# Patient Record
Sex: Male | Born: 1960
Health system: Southern US, Community
[De-identification: ages and names within clinical notes are randomized; demographics above are authoritative.]

## PROBLEM LIST (undated history)

## (undated) DIAGNOSIS — K219 Gastro-esophageal reflux disease without esophagitis: Secondary | ICD-10-CM

## (undated) DIAGNOSIS — K5792 Diverticulitis of intestine, part unspecified, without perforation or abscess without bleeding: Secondary | ICD-10-CM

## (undated) DIAGNOSIS — M2022 Hallux rigidus, left foot: Secondary | ICD-10-CM

## (undated) DIAGNOSIS — F319 Bipolar disorder, unspecified: Secondary | ICD-10-CM

## (undated) DIAGNOSIS — K5790 Diverticulosis of intestine, part unspecified, without perforation or abscess without bleeding: Secondary | ICD-10-CM

## (undated) DIAGNOSIS — K760 Fatty (change of) liver, not elsewhere classified: Secondary | ICD-10-CM

## (undated) DIAGNOSIS — K648 Other hemorrhoids: Secondary | ICD-10-CM

## (undated) DIAGNOSIS — B019 Varicella without complication: Secondary | ICD-10-CM

## (undated) DIAGNOSIS — K635 Polyp of colon: Secondary | ICD-10-CM

## (undated) HISTORY — PX: HERNIA REPAIR: SHX51

## (undated) HISTORY — DX: Diverticulitis of intestine, part unspecified, without perforation or abscess without bleeding: K57.92

## (undated) HISTORY — DX: Fatty (change of) liver, not elsewhere classified: K76.0

## (undated) HISTORY — DX: Gastro-esophageal reflux disease without esophagitis: K21.9

## (undated) HISTORY — PX: APPENDECTOMY: SHX54

## (undated) HISTORY — DX: Other hemorrhoids: K64.8

## (undated) HISTORY — DX: Polyp of colon: K63.5

## (undated) HISTORY — DX: Varicella without complication: B01.9

## (undated) HISTORY — DX: Diverticulosis of intestine, part unspecified, without perforation or abscess without bleeding: K57.90

## (undated) HISTORY — DX: Bipolar disorder, unspecified: F31.9

---

## 2013-11-13 DIAGNOSIS — D518 Other vitamin B12 deficiency anemias: Secondary | ICD-10-CM | POA: Diagnosis not present

## 2013-11-13 DIAGNOSIS — N400 Enlarged prostate without lower urinary tract symptoms: Secondary | ICD-10-CM | POA: Diagnosis not present

## 2013-11-13 DIAGNOSIS — N419 Inflammatory disease of prostate, unspecified: Secondary | ICD-10-CM | POA: Diagnosis not present

## 2013-11-13 DIAGNOSIS — E291 Testicular hypofunction: Secondary | ICD-10-CM | POA: Diagnosis not present

## 2013-11-13 DIAGNOSIS — F319 Bipolar disorder, unspecified: Secondary | ICD-10-CM | POA: Diagnosis not present

## 2013-11-13 DIAGNOSIS — E119 Type 2 diabetes mellitus without complications: Secondary | ICD-10-CM | POA: Diagnosis not present

## 2013-11-13 DIAGNOSIS — F411 Generalized anxiety disorder: Secondary | ICD-10-CM | POA: Diagnosis not present

## 2013-11-13 DIAGNOSIS — R300 Dysuria: Secondary | ICD-10-CM | POA: Diagnosis not present

## 2013-11-13 DIAGNOSIS — D508 Other iron deficiency anemias: Secondary | ICD-10-CM | POA: Diagnosis not present

## 2013-11-27 DIAGNOSIS — J209 Acute bronchitis, unspecified: Secondary | ICD-10-CM | POA: Diagnosis not present

## 2013-11-27 DIAGNOSIS — Z6827 Body mass index (BMI) 27.0-27.9, adult: Secondary | ICD-10-CM | POA: Diagnosis not present

## 2013-11-27 DIAGNOSIS — E119 Type 2 diabetes mellitus without complications: Secondary | ICD-10-CM | POA: Diagnosis not present

## 2014-12-07 DIAGNOSIS — K5750 Diverticulosis of both small and large intestine without perforation or abscess without bleeding: Secondary | ICD-10-CM | POA: Diagnosis not present

## 2014-12-07 DIAGNOSIS — K5732 Diverticulitis of large intestine without perforation or abscess without bleeding: Secondary | ICD-10-CM | POA: Diagnosis not present

## 2015-03-01 DIAGNOSIS — B852 Pediculosis, unspecified: Secondary | ICD-10-CM | POA: Diagnosis not present

## 2015-03-01 DIAGNOSIS — B850 Pediculosis due to Pediculus humanus capitis: Secondary | ICD-10-CM | POA: Diagnosis not present

## 2015-04-15 DIAGNOSIS — F312 Bipolar disorder, current episode manic severe with psychotic features: Secondary | ICD-10-CM | POA: Diagnosis not present

## 2015-04-26 ENCOUNTER — Emergency Department (INDEPENDENT_AMBULATORY_CARE_PROVIDER_SITE_OTHER)
Admission: EM | Admit: 2015-04-26 | Discharge: 2015-04-26 | Disposition: A | Discharge status: Home / Self Care | Payer: Medicare Other | Source: Home / Self Care | Attending: Emergency Medicine | Admitting: Emergency Medicine

## 2015-04-26 ENCOUNTER — Encounter (HOSPITAL_COMMUNITY): Payer: Self-pay | Admitting: Emergency Medicine

## 2015-04-26 ENCOUNTER — Emergency Department (HOSPITAL_COMMUNITY): Payer: Medicare Other

## 2015-04-26 DIAGNOSIS — S39012A Strain of muscle, fascia and tendon of lower back, initial encounter: Secondary | ICD-10-CM | POA: Diagnosis not present

## 2015-04-26 DIAGNOSIS — M25531 Pain in right wrist: Secondary | ICD-10-CM | POA: Diagnosis not present

## 2015-04-26 MED ORDER — METHOCARBAMOL 500 MG PO TABS: 500.0000 mg | ORAL_TABLET | Freq: Four times a day (QID) | ORAL | Status: DC | PRN

## 2015-04-26 MED ORDER — MELOXICAM 15 MG PO TABS: 7.5000 mg | ORAL_TABLET | Freq: Every day | ORAL | Status: DC

## 2015-04-26 MED ORDER — KETOROLAC TROMETHAMINE 60 MG/2ML IM SOLN
60.0000 mg | Freq: Once | INTRAMUSCULAR | Status: AC
  Administered 2015-04-26: 60 mg via INTRAMUSCULAR

## 2015-04-26 MED ORDER — KETOROLAC TROMETHAMINE 60 MG/2ML IM SOLN
INTRAMUSCULAR | Status: AC
  Filled 2015-04-26: qty 2

## 2015-04-26 NOTE — ED Notes (Signed)
C/o back pain and wrist pain States he fall in March 05 2014 States right side of back hurts radiating downward States left wrist hurts due to fall

## 2015-04-26 NOTE — ED Provider Notes (Signed)
CSN: [id number]     Arrival date & time 04/26/15  1302 History   None    Chief Complaint  Patient presents with  . Back Pain   (Consider location/radiation/quality/duration/timing/severity/associated sxs/prior Treatment) HPI   Fell on tile floor on 03/06/15. Initially w/ pain on R flank. Slowly has migrated up to R upper back. Hot and cold, and tylenol w/o improvememntn. Not getting better.   L wrist pain. Also started shortly after the fall. Pain comes and goes. No swelling. No loss of function or numbness or tingling in the hand. Wrist splint with intermittent improvement.  Patient purchased a new mattress to see if this would help but has had no additional relief. Symptoms overall are not getting worse but are not getting better either.  Pain is back is worse with certain movements.  History reviewed. No pertinent past medical history. History reviewed. No pertinent past surgical history. Family History  Problem Relation Age of Onset  . Cancer Mother   . Heart attack Father    Social History  Substance Use Topics  . Smoking status: Never Smoker   . Smokeless tobacco: None  . Alcohol Use: No    Review of Systems Per HPI with all other pertinent systems negative.   Allergies  Review of patient's allergies indicates no known allergies.  Home Medications   Prior to Admission medications   Medication Sig Start Date End Date Taking? Authorizing Provider  meloxicam (MOBIC) 15 MG tablet Take 0.5-1 tablets (7.5-15 mg total) by mouth daily. 04/26/15   Waldemar Dickens, MD  methocarbamol (ROBAXIN) 500 MG tablet Take 1-2 tablets (500-1,000 mg total) by mouth every 6 (six) hours as needed for muscle spasms. 04/26/15   Waldemar Dickens, MD   BP 129/90 mmHg  Pulse 101  Temp(Src) 98.3 F (36.8 C) (Oral)  Resp 16  SpO2 95% Physical Exam Physical Exam  Constitutional: oriented to person, place, and time. appears well-developed and well-nourished. No distress.  HENT:  Head:  Normocephalic and atraumatic.  Eyes: EOMI. PERRL.  Neck: Normal range of motion.  Cardiovascular: RRR, no m/r/g, 2+ distal pulses,  Pulmonary/Chest: Effort normal and breath sounds normal. No respiratory distress.  Abdominal: Soft. Bowel sounds are normal. NonTTP, no distension.  Musculoskeletal: Normal range of motion. Non ttp, no effusion.  Neurological: alert and oriented to person, place, and time.  Skin: Skin is warm. No rash noted. non diaphoretic.  Psychiatric: normal mood and affect. behavior is normal. Judgment and thought content normal.   ED Course  Procedures (including critical care time) Labs Review Labs Reviewed - No data to display  Imaging Review No results found.   MDM   1. Back strain, initial encounter   2. Wrist joint pain, right    Total 60 mg IM, given in clinic. Start meloxicam and Robaxin and exercises demonstrated. Heat and ice and massage as tolerated. No need for imaging of the left wrist given patient's full function and the fact that the wrist is nontender to palpation. Patient follow-up with PCP or sports medicine if not improving.    Waldemar Dickens, MD 04/26/15 [phone]

## 2015-04-26 NOTE — Discharge Instructions (Signed)
Your injuries are likely not permanent. You likely suffered muscle strain and spasm. Your given a dose of Toradol to help with your pain and inflammation. In 24 hours she may start the meloxicam. Please start using the Robaxin to help relieve your tight muscles. Please remember to perform range of motion exercises and to use heat and massage as tolerated.

## 2015-05-21 DIAGNOSIS — F312 Bipolar disorder, current episode manic severe with psychotic features: Secondary | ICD-10-CM | POA: Diagnosis not present

## 2015-07-26 ENCOUNTER — Emergency Department (HOSPITAL_COMMUNITY)
Admission: EM | Admit: 2015-07-26 | Discharge: 2015-07-26 | Disposition: A | Discharge status: Home / Self Care | Payer: Commercial Managed Care - HMO | Attending: Emergency Medicine | Admitting: Emergency Medicine

## 2015-07-26 ENCOUNTER — Emergency Department (HOSPITAL_COMMUNITY): Payer: Commercial Managed Care - HMO

## 2015-07-26 ENCOUNTER — Encounter (HOSPITAL_COMMUNITY): Payer: Self-pay | Admitting: Emergency Medicine

## 2015-07-26 DIAGNOSIS — Z8659 Personal history of other mental and behavioral disorders: Secondary | ICD-10-CM | POA: Insufficient documentation

## 2015-07-26 DIAGNOSIS — F329 Major depressive disorder, single episode, unspecified: Secondary | ICD-10-CM | POA: Insufficient documentation

## 2015-07-26 DIAGNOSIS — M79675 Pain in left toe(s): Secondary | ICD-10-CM | POA: Insufficient documentation

## 2015-07-26 DIAGNOSIS — Z791 Long term (current) use of non-steroidal anti-inflammatories (NSAID): Secondary | ICD-10-CM | POA: Diagnosis not present

## 2015-07-26 DIAGNOSIS — R0789 Other chest pain: Secondary | ICD-10-CM | POA: Diagnosis not present

## 2015-07-26 DIAGNOSIS — F17210 Nicotine dependence, cigarettes, uncomplicated: Secondary | ICD-10-CM | POA: Diagnosis not present

## 2015-07-26 DIAGNOSIS — R079 Chest pain, unspecified: Secondary | ICD-10-CM | POA: Diagnosis not present

## 2015-07-26 DIAGNOSIS — F32A Depression, unspecified: Secondary | ICD-10-CM

## 2015-07-26 LAB — BASIC METABOLIC PANEL
ANION GAP: 9 (ref 5–15)
BUN: 10 mg/dL (ref 6–20)
CALCIUM: 9.7 mg/dL (ref 8.9–10.3)
CO2: 25 mmol/L (ref 22–32)
Chloride: 105 mmol/L (ref 101–111)
Creatinine, Ser: 0.95 mg/dL (ref 0.61–1.24)
GLUCOSE: 111 mg/dL — AB (ref 65–99)
POTASSIUM: 3.9 mmol/L (ref 3.5–5.1)
SODIUM: 139 mmol/L (ref 135–145)

## 2015-07-26 LAB — CBC WITH DIFFERENTIAL/PLATELET
Basophils Absolute: 0 10*3/uL (ref 0.0–0.1)
Basophils Relative: 0 %
EOS PCT: 2 %
Eosinophils Absolute: 0.2 10*3/uL (ref 0.0–0.7)
HCT: 43.4 % (ref 39.0–52.0)
HEMOGLOBIN: 15.3 g/dL (ref 13.0–17.0)
LYMPHS ABS: 2.5 10*3/uL (ref 0.7–4.0)
LYMPHS PCT: 31 %
MCH: 32.3 pg (ref 26.0–34.0)
MCHC: 35.3 g/dL (ref 30.0–36.0)
MCV: 91.6 fL (ref 78.0–100.0)
MONOS PCT: 5 %
Monocytes Absolute: 0.4 10*3/uL (ref 0.1–1.0)
Neutro Abs: 4.9 10*3/uL (ref 1.7–7.7)
Neutrophils Relative %: 62 %
PLATELETS: 227 10*3/uL (ref 150–400)
RBC: 4.74 MIL/uL (ref 4.22–5.81)
RDW: 12.7 % (ref 11.5–15.5)
WBC: 8 10*3/uL (ref 4.0–10.5)

## 2015-07-26 LAB — I-STAT TROPONIN, ED: TROPONIN I, POC: 0 ng/mL (ref 0.00–0.08)

## 2015-07-26 MED ORDER — DICLOFENAC SODIUM 25 MG PO TBEC
25.0000 mg | DELAYED_RELEASE_TABLET | Freq: Once | ORAL | Status: DC
  Filled 2015-07-26: qty 1

## 2015-07-26 MED ORDER — DIAZEPAM 2 MG PO TABS
2.0000 mg | ORAL_TABLET | Freq: Once | ORAL | Status: AC
  Administered 2015-07-26: 2 mg via ORAL
  Filled 2015-07-26: qty 1

## 2015-07-26 NOTE — Discharge Instructions (Signed)
Take NSAID's as able, will help with both your foot and your chest.  Take zantac for your reflux disease twice a day.   Take 4 over the counter ibuprofen tablets 3 times a day or 2 over-the-counter naproxen tablets twice a day for pain.  [place] in the [facility]  Intensive Outpatient Programs: [facility]      [place]. [place], [place] Both a day and evening program       [facility] Outpatient     [address]        [place], [place] [address] [phone]         ADS: Alcohol & Drug Svcs [place]: [phone] or [phone] [address], [place] [address] [email or web address]   Substance Abuse Resources: - Alcohol and Drug Services  [phone] - [facility] [phone] - The [place] [phone] - Residential & Outpatient Substance Abuse Program  [phone]  Psychological Services: - [place]  [place]  [place], [phone] [place]. [address], [place], [place]: [phone] or [phone], [email or web address]  Mobile Crisis Teams:                                        [facility]rapeutic [facility]atives         Mobile Crisis Care Unit [phone]             [facility]ive Psychotherapeutic Services [address] Lady [place] [address] [place] [phone]  Self-Help/Support Groups: [place]. of [employer] of support groups [phone] (call for more info)  Narcotics Anonymous (NA) Caring Services [address] [place] - 2 meetings at this location  Residential  Treatment Programs:  [place]       [place] [address] [place] of , [place]  [address] [place]  [address] [place], [place] [address] [phone] Admissions: 8am-3pm M-F  Incentives Substance [place]     [address],  [address]       [phone]         [facility] [address] Jadene Pierini [place], [place]  [facility] [address] [place], [place]  [facility] - Intensive Outpatient      [phone]     [place], [place]         [facility] ([place]     [address] [place], [place] or [phone]  Residential Treatment Services (RTS), Medicaid [address] [place], [place]  [facility] [place]  [place] Resources: [phone]               General Therapy                                                Domenic Schwab, PhD        [address] [place], [place] [address]         [facility]   [address] [place], [place] [address] [phone]  [facility] [address] [place], [place] [address] [phone] Insurance/Medicaid/sponsorship through [facility]                                              [address] [place]                                        [place], [place] [address]    Therapy/tele-psych/case         [place] [address] [place], [place]  [address]  Adolescent/group home/case management [phone]                                           Rosette Reveal  PhD       General therapy       Insurance   [phone]         Dr. Adele Schilder, Insurance, M-F [phone]  [facility] Dept. [address]         [address] [place] Phone:  [phone]                                  Phone:  [phone]                   Phone:  [place], [phone]       -     Larence Penning  [facility] in [place], [address],             [phone], Insurance   Nonspecific Chest Pain It is often hard to find the cause of chest pain. There is always a chance that your pain could be related to something serious, such as a heart attack or a blood clot in your lungs. Chest pain can also be caused by conditions that are not life-threatening. If you have chest pain, it is very important to follow up with your doctor.  HOME CARE  If you were prescribed an antibiotic medicine, finish it all even if you start to feel better.  Avoid any activities that cause chest pain.  Do not use any tobacco products, including cigarettes, chewing tobacco, or electronic cigarettes. If you need help quitting, ask your doctor.  Do not drink alcohol.  Take medicines only as told by your doctor.  Keep all follow-up visits as told by your doctor. This is important. This includes any further testing if your chest pain does not go away.  Your doctor may tell you to keep your head raised (elevated) while you sleep.  Make lifestyle changes as told by your doctor. These may include:  Getting regular exercise. Ask your doctor to suggest some activities that are safe for you.  Eating a heart-healthy diet. Your doctor or a diet specialist (dietitian) can help you to learn healthy  eating options.  Maintaining a healthy weight.  Managing diabetes, if necessary.  Reducing stress. GET HELP IF:  Your chest pain does not go away, even after treatment.  You have a rash with blisters on your chest.  You have a fever. GET HELP RIGHT AWAY IF:  Your chest pain is worse.  You have an increasing cough, or you cough up blood.  You have severe belly (abdominal) pain.  You feel extremely weak.  You pass out (faint).  You have chills.  You have sudden, unexplained chest discomfort.  You have sudden, unexplained discomfort in your arms, back, neck, or jaw.  You have shortness of breath at any time.  You suddenly start to sweat, or your skin gets clammy.  You feel nauseous.  You vomit.  You suddenly feel light-headed or dizzy.  Your heart begins to beat quickly, or it feels like it is skipping beats. These symptoms may be an emergency. Do not wait to see if the symptoms will go away. Get medical help right away. Call your local emergency services (911 in the [place] Do not drive yourself to the hospital.   This information is not intended to replace advice given to you by your health care provider. Make sure you discuss any questions you have with your health care provider.   Document Released: 02/07/2008 Document Revised: 09/11/2014 Document Reviewed: 03/27/2014 Elsevier Interactive Patient Education [employer].

## 2015-07-26 NOTE — ED Notes (Signed)
Pt to ER via POV with complaint of left chest tightness x"days" and left toe pain, pt reports hx of gout. Pt requesting to see MD. Refusing to have labs drawn or IV placement at this time. States "I've had all this checked before and they give me fake medicine for it." pt is a/o x4. Does not appear to be in any distress at this time.

## 2015-07-26 NOTE — ED Notes (Signed)
Pt reports he has been "trying to order gout medicine from [place] and I call them and pay them but they just don't send it to me, it's supposed to get the uric acid out of my blood."  Pt also reports he thinks this chest tightness is coming from "all the negative comments and remarks about the election and my sister just got diagnosed with breast cancer and the other can't get her bp under control. Just all these bad things happening."

## 2015-07-26 NOTE — ED Notes (Signed)
Dr. Tyrone Nine, MD at bedside.

## 2015-07-26 NOTE — ED Provider Notes (Signed)
CSN: [id number]     Arrival date & time 07/26/15  0736 History   First MD Initiated Contact with Patient 07/26/15 0754     Chief Complaint  Patient presents with  . Chest Pain     (Consider location/radiation/quality/duration/timing/severity/associated sxs/prior Treatment) Patient is a 54 y.o. male presenting with chest pain.  Chest Pain Pain location:  L lateral chest Pain quality: dull   Pain radiates to the back: no   Pain severity:  Mild Onset quality:  Gradual Duration:  2 days Timing:  Intermittent Progression:  Worsening Chronicity:  New Relieved by:  Nothing Worsened by:  Nothing tried Ineffective treatments:  None tried Associated symptoms: no abdominal pain, no fever, no headache, no palpitations, no shortness of breath and not vomiting   Risk factors: smoking   Risk factors: no coronary artery disease, no diabetes mellitus, no high cholesterol and no hypertension     54 yo M with a chief complaint of chest pain. This is left lateral feels like a pressure. Unsure what makes it better or worse. This been going on for about 2 days. Patient feels like it's been constant for at least the past 6 hours. Denies shortness of breath diaphoresis nausea vomiting. Patient also complaining of left great toe pain. Patient is sure that this is gout as he is read about this online. Patient is currently waiting medications from [place] for this. He feels like the over-the-counter medicines do not help for this and feels like they are fake. Patient has seen a couple different doctors for this in a couple different states and feels like they haven't been able to help him. States multiple different times that he wishes he was closer to the ocean in warm weather. He is also tired of listening to the news is that depresses him. He feels that all his friends are dying. States a lot of his family members have cancer. Denies suicidal or homicidal ideation. Denies hallucinations.  History  reviewed. No pertinent past medical history. History reviewed. No pertinent past surgical history. Family History  Problem Relation Age of Onset  . Cancer Mother   . Heart attack Father    Social History  Substance Use Topics  . Smoking status: Current Every Day Smoker -- 1.00 packs/day    Types: Cigarettes  . Smokeless tobacco: None  . Alcohol Use: No    Review of Systems  Constitutional: Negative for fever and chills.  HENT: Negative for congestion and facial swelling.   Eyes: Negative for discharge and visual disturbance.  Respiratory: Negative for shortness of breath.   Cardiovascular: Positive for chest pain. Negative for palpitations.  Gastrointestinal: Negative for vomiting, abdominal pain and diarrhea.  Musculoskeletal: Negative for myalgias and arthralgias.  Skin: Negative for color change and rash.  Neurological: Negative for tremors, syncope and headaches.  Psychiatric/Behavioral: Negative for confusion and dysphoric mood.      Allergies  Review of patient's allergies indicates no known allergies.  Home Medications   Prior to Admission medications   Medication Sig Start Date End Date Taking? Authorizing Provider  meloxicam (MOBIC) 15 MG tablet Take 0.5-1 tablets (7.5-15 mg total) by mouth daily. 04/26/15   Waldemar Dickens, MD  methocarbamol (ROBAXIN) 500 MG tablet Take 1-2 tablets (500-1,000 mg total) by mouth every 6 (six) hours as needed for muscle spasms. 04/26/15   Waldemar Dickens, MD   BP 118/86 mmHg  Pulse 79  Temp(Src) 98.7 F (37.1 C) (Oral)  Resp 20  SpO2 98% Physical  Exam  Constitutional: He is oriented to person, place, and time. He appears well-developed and well-nourished.  HENT:  Head: Normocephalic and atraumatic.  Eyes: EOM are normal. Pupils are equal, round, and reactive to light.  Neck: Normal range of motion. Neck supple. No JVD present.  Cardiovascular: Normal rate and regular rhythm.  Exam reveals no gallop and no friction rub.   No  murmur heard. Pulmonary/Chest: No respiratory distress. He has no wheezes. He exhibits no tenderness.  Abdominal: He exhibits no distension. There is no rebound and no guarding.  Musculoskeletal: Normal range of motion. He exhibits no edema or tenderness.  No noted tenderness or swelling noted to the left great toe. Refill less than 2 seconds.  Neurological: He is alert and oriented to person, place, and time.  Skin: No rash noted. No pallor.  Psychiatric: His behavior is normal. He exhibits a depressed mood.  Nursing note and vitals reviewed.   ED Course  Procedures (including critical care time) Labs Review Labs Reviewed  BASIC METABOLIC PANEL - Abnormal; Notable for the following:    Glucose, Bld 111 (*)    All other components within normal limits  CBC WITH DIFFERENTIAL/PLATELET  Randolm Idol, ED    Imaging Review Dg Chest 2 View  07/26/2015  CLINICAL DATA:  Chest pain starting this morning EXAM: CHEST  2 VIEW COMPARISON:  None. FINDINGS: Cardiomediastinal silhouette is unremarkable. No acute infiltrate or pleural effusion. No pulmonary edema. Mild hyperinflation. IMPRESSION: No active cardiopulmonary disease. Electronically Signed   By: Lahoma Crocker M.D.   On: 07/26/2015 09:01   I have personally reviewed and evaluated these images and lab results as part of my medical decision-making.   EKG Interpretation   Date/Time:  Monday July 26 2015 07:50:02 EST Ventricular Rate:  61 PR Interval:  139 QRS Duration: 87 QT Interval:  407 QTC Calculation: 410 R Axis:   68 Text Interpretation:  Sinus rhythm ST elev, probable normal early repol  pattern No old tracing to compare Confirmed by Londen Bok MD, DANIEL [phone]) on  07/26/2015 7:52:23 AM      MDM   Final diagnoses:  Depression  Chest pain, unspecified chest pain type  Pain of toe of left foot    54 yo M with a chief complaint of left-sided chest pain. Atypical for cardiac disease. Single troponin negative feel  this is sufficient as his pain has not changed in the past 6 hours.  Chest x-ray and EKG unremarkable. Left toe not consistent with gout. We'll have the patient take NSAIDs for his pain. Concern for significant depression suggested that he follow-up with a psychiatrist.  9:52 AM:  I have discussed the diagnosis/risks/treatment options with the patient and believe the pt to be eligible for discharge home to follow-up with PCP, psych. We also discussed returning to the ED immediately if new or worsening sx occur. We discussed the sx which are most concerning (e.g., sudden worsening pain, fever, inability to tolerate by mouth) that necessitate immediate return. Medications administered to the patient during their visit and any new prescriptions provided to the patient are listed below.  Medications given during this visit Medications  diclofenac (VOLTAREN) EC tablet 25 mg (not administered)  diazepam (VALIUM) tablet 2 mg (2 mg Oral Given 07/26/15 0903)    New Prescriptions   No medications on file    The patient appears reasonably screen and/or stabilized for discharge and I doubt any other medical condition or other [facility] requiring further screening, evaluation, or treatment  in the ED at this time prior to discharge.      Deno Etienne, DO 07/26/15 [phone]

## 2015-08-16 ENCOUNTER — Emergency Department (HOSPITAL_COMMUNITY)
Admission: EM | Admit: 2015-08-16 | Discharge: 2015-08-16 | Discharge status: Against Medical Advice | Payer: Commercial Managed Care - HMO | Source: Home / Self Care

## 2015-08-16 ENCOUNTER — Encounter (HOSPITAL_COMMUNITY): Payer: Self-pay | Admitting: Emergency Medicine

## 2015-08-16 NOTE — ED Notes (Signed)
The patient became very irate, belligerent and cursing stating that he had waited long enough. The patient was advised that he would be seen in order and that he could wait in his room or he was free to sign out AMA. The patient completed [place] paperwork and left.

## 2015-08-16 NOTE — ED Notes (Signed)
The patient presented to the [facility] with a complaint of left lowe abdominal pain that started 3 days ago.

## 2015-08-17 ENCOUNTER — Emergency Department (HOSPITAL_COMMUNITY): Payer: Commercial Managed Care - HMO

## 2015-08-17 ENCOUNTER — Encounter (HOSPITAL_COMMUNITY): Payer: Self-pay | Admitting: Emergency Medicine

## 2015-08-17 ENCOUNTER — Emergency Department (HOSPITAL_COMMUNITY)
Admission: EM | Admit: 2015-08-17 | Discharge: 2015-08-17 | Disposition: A | Discharge status: Home / Self Care | Payer: Commercial Managed Care - HMO | Attending: Emergency Medicine | Admitting: Emergency Medicine

## 2015-08-17 DIAGNOSIS — F17210 Nicotine dependence, cigarettes, uncomplicated: Secondary | ICD-10-CM | POA: Insufficient documentation

## 2015-08-17 DIAGNOSIS — R1032 Left lower quadrant pain: Secondary | ICD-10-CM | POA: Diagnosis not present

## 2015-08-17 DIAGNOSIS — M79675 Pain in left toe(s): Secondary | ICD-10-CM | POA: Diagnosis not present

## 2015-08-17 DIAGNOSIS — K5732 Diverticulitis of large intestine without perforation or abscess without bleeding: Secondary | ICD-10-CM | POA: Diagnosis not present

## 2015-08-17 DIAGNOSIS — Z79899 Other long term (current) drug therapy: Secondary | ICD-10-CM | POA: Diagnosis not present

## 2015-08-17 LAB — COMPREHENSIVE METABOLIC PANEL
ALK PHOS: 55 U/L (ref 38–126)
ALT: 39 U/L (ref 17–63)
ANION GAP: 9 (ref 5–15)
AST: 26 U/L (ref 15–41)
Albumin: 4.6 g/dL (ref 3.5–5.0)
BUN: 20 mg/dL (ref 6–20)
CALCIUM: 9.8 mg/dL (ref 8.9–10.3)
CO2: 25 mmol/L (ref 22–32)
CREATININE: 1.07 mg/dL (ref 0.61–1.24)
Chloride: 108 mmol/L (ref 101–111)
GFR calc Af Amer: >60 mL/min (ref >60)
Glucose, Bld: 174 mg/dL — ABNORMAL HIGH (ref 65–99)
Potassium: 3.8 mmol/L (ref 3.5–5.1)
Sodium: 142 mmol/L (ref 135–145)
TOTAL PROTEIN: 7.8 g/dL (ref 6.5–8.1)
Total Bilirubin: 1.1 mg/dL (ref 0.3–1.2)

## 2015-08-17 LAB — URINALYSIS, ROUTINE W REFLEX MICROSCOPIC
BILIRUBIN URINE: NEGATIVE
Glucose, UA: NEGATIVE mg/dL
HGB URINE DIPSTICK: NEGATIVE
Ketones, ur: NEGATIVE mg/dL
Leukocytes, UA: NEGATIVE
NITRITE: NEGATIVE
PROTEIN: NEGATIVE mg/dL
SPECIFIC GRAVITY, URINE: 1.022 (ref 1.005–1.030)
pH: 5.5 (ref 5.0–8.0)

## 2015-08-17 LAB — LIPASE, BLOOD: LIPASE: 48 U/L (ref 11–51)

## 2015-08-17 LAB — CBC
HCT: 44.5 % (ref 39.0–52.0)
Hemoglobin: 15.4 g/dL (ref 13.0–17.0)
MCH: 32.4 pg (ref 26.0–34.0)
MCHC: 34.6 g/dL (ref 30.0–36.0)
MCV: 93.5 fL (ref 78.0–100.0)
PLATELETS: 256 10*3/uL (ref 150–400)
RBC: 4.76 MIL/uL (ref 4.22–5.81)
RDW: 12.8 % (ref 11.5–15.5)
WBC: 9 10*3/uL (ref 4.0–10.5)

## 2015-08-17 LAB — I-STAT CG4 LACTIC ACID, ED: LACTIC ACID, VENOUS: 1.93 mmol/L (ref 0.5–2.0)

## 2015-08-17 MED ORDER — METRONIDAZOLE 500 MG PO TABS: 500.0000 mg | ORAL_TABLET | Freq: Two times a day (BID) | ORAL | Status: DC

## 2015-08-17 MED ORDER — IOHEXOL 300 MG/ML  SOLN
100.0000 mL | Freq: Once | INTRAMUSCULAR | Status: AC | PRN
  Administered 2015-08-17: 100 mL via INTRAVENOUS

## 2015-08-17 MED ORDER — MORPHINE SULFATE (PF) 4 MG/ML IV SOLN
4.0000 mg | Freq: Once | INTRAVENOUS | Status: AC
  Administered 2015-08-17: 4 mg via INTRAVENOUS
  Filled 2015-08-17: qty 1

## 2015-08-17 MED ORDER — CIPROFLOXACIN HCL 500 MG PO TABS: 500.0000 mg | ORAL_TABLET | Freq: Two times a day (BID) | ORAL | Status: DC

## 2015-08-17 MED ORDER — SODIUM CHLORIDE 0.9 % IV SOLN
1000.0000 mL | Freq: Once | INTRAVENOUS | Status: AC
  Administered 2015-08-17: 1000 mL via INTRAVENOUS

## 2015-08-17 MED ORDER — ONDANSETRON HCL 4 MG/2ML IJ SOLN
4.0000 mg | Freq: Once | INTRAMUSCULAR | Status: AC
  Administered 2015-08-17: 4 mg via INTRAVENOUS
  Filled 2015-08-17: qty 2

## 2015-08-17 MED ORDER — IOHEXOL 300 MG/ML  SOLN
50.0000 mL | Freq: Once | INTRAMUSCULAR | Status: AC | PRN
  Administered 2015-08-17: 50 mL via ORAL

## 2015-08-17 MED ORDER — OXYCODONE HCL 5 MG PO TABS: 5.0000 mg | ORAL_TABLET | ORAL | Status: DC | PRN

## 2015-08-17 NOTE — ED Provider Notes (Signed)
CSN: [id number]     Arrival date & time 08/17/15  1020 History   First MD Initiated Contact with Patient 08/17/15 1037     Chief Complaint  Patient presents with  . Toe Pain  . Abdominal Pain     (Consider location/radiation/quality/duration/timing/severity/associated sxs/prior Treatment) Patient is a 54 y.o. male presenting with toe pain and abdominal pain. The history is provided by the patient.  Toe Pain This is a new problem. The current episode started more than 2 days ago. The problem occurs constantly. The problem has not changed since onset.Associated symptoms include abdominal pain (LLQ). Pertinent negatives include no chest pain, no headaches and no shortness of breath. Nothing aggravates the symptoms. Nothing relieves the symptoms. He has tried nothing for the symptoms. The treatment provided no relief.  Abdominal Pain Associated symptoms: no chest pain, no chills, no diarrhea, no fever, no shortness of breath and no vomiting    54 yo M with a chief complaint of left lower quadrant abdominal pain. This been going on for about 4 days. Pain is worse with movement palpation twisting. Also worse when his dog jumps on him. Patient denies any fevers or chills denies any vomiting or diarrhea. Patient states that he has a history of diverticulitis in is concerned that maybe about the rupture.   History reviewed. No pertinent past medical history. History reviewed. No pertinent past surgical history. Family History  Problem Relation Age of Onset  . Cancer Mother   . Heart attack Father    Social History  Substance Use Topics  . Smoking status: Current Every Day Smoker -- 1.00 packs/day    Types: Cigarettes  . Smokeless tobacco: None  . Alcohol Use: No    Review of Systems  Constitutional: Negative for fever and chills.  HENT: Negative for congestion and facial swelling.   Eyes: Negative for discharge and visual disturbance.  Respiratory: Negative for shortness of breath.    Cardiovascular: Negative for chest pain and palpitations.  Gastrointestinal: Positive for abdominal pain (LLQ). Negative for vomiting and diarrhea.  Musculoskeletal: Negative for myalgias and arthralgias.  Skin: Negative for color change and rash.  Neurological: Negative for tremors, syncope and headaches.  Psychiatric/Behavioral: Negative for confusion and dysphoric mood.      Allergies  Review of patient's allergies indicates no known allergies.  Home Medications   Prior to Admission medications   Medication Sig Start Date End Date Taking? Authorizing Provider  QUEtiapine (SEROQUEL) 100 MG tablet Take 100 mg by mouth at bedtime.   Yes Historical Provider, MD  ciprofloxacin (CIPRO) 500 MG tablet Take 1 tablet (500 mg total) by mouth 2 (two) times daily. One po bid x 7 days 08/17/15   Deno Etienne, DO  meloxicam (MOBIC) 15 MG tablet Take 0.5-1 tablets (7.5-15 mg total) by mouth daily. Patient not taking: Reported on 08/17/2015 04/26/15   Waldemar Dickens, MD  methocarbamol (ROBAXIN) 500 MG tablet Take 1-2 tablets (500-1,000 mg total) by mouth every 6 (six) hours as needed for muscle spasms. Patient not taking: Reported on 08/17/2015 04/26/15   Waldemar Dickens, MD  metroNIDAZOLE (FLAGYL) 500 MG tablet Take 1 tablet (500 mg total) by mouth 2 (two) times daily. One po bid x 7 days 08/17/15   Deno Etienne, DO  oxyCODONE (ROXICODONE) 5 MG immediate release tablet Take 1 tablet (5 mg total) by mouth every 4 (four) hours as needed for severe pain. 08/17/15   Deno Etienne, DO   BP 125/101 mmHg  Pulse 81  Temp(Src) 97.8 F (36.6 C) (Oral)  Resp 18  SpO2 97% Physical Exam  Constitutional: He is oriented to person, place, and time. He appears well-developed and well-nourished.  HENT:  Head: Normocephalic and atraumatic.  Eyes: EOM are normal. Pupils are equal, round, and reactive to light.  Neck: Normal range of motion. Neck supple. No JVD present.  Cardiovascular: Normal rate and regular rhythm.   Exam reveals no gallop and no friction rub.   No murmur heard. Pulmonary/Chest: No respiratory distress. He has no wheezes.  Abdominal: He exhibits no distension. There is tenderness (left lower lateral). There is no rebound and no guarding.  Musculoskeletal: Normal range of motion. He exhibits no edema or tenderness.  Patient has left lower abdominal tenderness that is worse in the lateral obliques. No noted deep tenderness palpation. Complaining of tenderness to the first toe of his left foot. Not able to reproduce pain on exam.  Neurological: He is alert and oriented to person, place, and time.  Skin: No rash noted. No pallor.  Psychiatric: He has a normal mood and affect. His behavior is normal.  Nursing note and vitals reviewed.   ED Course  Procedures (including critical care time) Labs Review Labs Reviewed  COMPREHENSIVE METABOLIC PANEL - Abnormal; Notable for the following:    Glucose, Bld 174 (*)    All other components within normal limits  LIPASE, BLOOD  CBC  URINALYSIS, ROUTINE W REFLEX MICROSCOPIC (NOT AT [facility])  I-STAT CG4 LACTIC ACID, ED    Imaging Review Ct Abdomen Pelvis W Contrast  08/17/2015  CLINICAL DATA:  Left lower quadrant abdominal pain which began on Saturday. EXAM: CT ABDOMEN AND PELVIS WITH CONTRAST TECHNIQUE: Multidetector CT imaging of the abdomen and pelvis was performed using the standard protocol following bolus administration of intravenous contrast. CONTRAST:  65mL OMNIPAQUE IOHEXOL 300 MG/ML SOLN, 174mL OMNIPAQUE IOHEXOL 300 MG/ML SOLN COMPARISON:  None. FINDINGS: Lower chest: The lungs are clear except for dependent subpleural atelectasis. No pleural effusion. No worrisome pulmonary lesions. The heart is normal in size. No pericardial effusion. The distal esophagus is grossly normal. Hepatobiliary: Mild diffuse fatty infiltration of the liver but no focal hepatic lesions or intrahepatic biliary dilatation. The gallbladder is normal. No common bile duct  dilatation. Pancreas: No mass, inflammation or ductal dilatation. Spleen: Normal size.  No focal lesions. Adrenals/Urinary Tract: The adrenal glands are normal. No renal mass, renal calculi or hydronephrosis. Minimal scarring changes are noted. A few scattered small low-attenuation lesions are likely benign cysts. No ureteral or bladder calculi or bladder mass. Stomach/Bowel: The stomach, duodenum and small bowel are unremarkable. No inflammatory changes, mass lesions or obstructive findings. The terminal ileum is normal. The appendix is surgically absent. There is fairly significant diverticulosis involving the sigmoid colon. There is a small focus of acute uncomplicated diverticulitis involving the distal descending colon. No free air or abscess. Vascular/Lymphatic: No mesenteric or retroperitoneal mass or adenopathy. The aorta is normal in caliber. The branch vessels are patent. Distal aortic and proximal iliac artery calcifications are noted. Other: The bladder, prostate gland and seminal vesicles are unremarkable. No pelvic mass or adenopathy. No free pelvic fluid collections. No inguinal mass or adenopathy. Musculoskeletal: No significant bony findings. IMPRESSION: Acute uncomplicated diverticulitis involving the distal descending colon. Advanced diverticulosis of the sigmoid colon. Mild diffuse fatty infiltration of the liver. Electronically Signed   By: Marijo Sanes M.D.   On: 08/17/2015 13:45   I have personally reviewed and evaluated these images and lab results  as part of my medical decision-making.   EKG Interpretation None      MDM   Final diagnoses:  Diverticulitis of large intestine without perforation or abscess without bleeding    54 yo M with a chief complaint of left lower quadrant abdominal pain. By history pain seems most likely consistent with musculoskeletal pain. Patient has a blunted affect and at times is somewhat delusional about his medical care. Do not feel like he needs  a active psych consult at this time.  CT scan with acute diverticulitis. Will treat the patient with antibiotics. Follow with his PCP.  4:07 PM:  I have discussed the diagnosis/risks/treatment options with the patient and believe the pt to be eligible for discharge home to follow-up with PCP. We also discussed returning to the ED immediately if new or worsening sx occur. We discussed the sx which are most concerning (e.g., sudden worsening pain, fever, inability to tolerate by mouth) that necessitate immediate return. Medications administered to the patient during their visit and any new prescriptions provided to the patient are listed below.  Medications given during this visit Medications  0.9 %  sodium chloride infusion (0 mLs Intravenous Stopped 08/17/15 1600)  morphine 4 MG/ML injection 4 mg (4 mg Intravenous Given 08/17/15 1200)  ondansetron (ZOFRAN) injection 4 mg (4 mg Intravenous Given 08/17/15 1159)  iohexol (OMNIPAQUE) 300 MG/ML solution 50 mL (50 mLs Oral Contrast Given 08/17/15 1143)  iohexol (OMNIPAQUE) 300 MG/ML solution 100 mL (100 mLs Intravenous Contrast Given 08/17/15 1311)    Discharge Medication List as of 08/17/2015  1:55 PM    START taking these medications   Details  ciprofloxacin (CIPRO) 500 MG tablet Take 1 tablet (500 mg total) by mouth 2 (two) times daily. One po bid x 7 days, Starting 08/17/2015, Until Discontinued, Print    metroNIDAZOLE (FLAGYL) 500 MG tablet Take 1 tablet (500 mg total) by mouth 2 (two) times daily. One po bid x 7 days, Starting 08/17/2015, Until Discontinued, Print    oxyCODONE (ROXICODONE) 5 MG immediate release tablet Take 1 tablet (5 mg total) by mouth every 4 (four) hours as needed for severe pain., Starting 08/17/2015, Until Discontinued, Print        The patient appears reasonably screen and/or stabilized for discharge and I doubt any other medical condition or other [facility] requiring further screening, evaluation, or treatment in the ED  at this time prior to discharge.    Deno Etienne, DO 08/17/15 [phone]

## 2015-08-17 NOTE — ED Notes (Signed)
Patient was alert, oriented and stable upon discharge. RN went over AVS and patient had no further questions.  

## 2015-08-17 NOTE — ED Notes (Addendum)
Pt c/o LLQ abdominal pain starting at 2000 Saturday night. Says pain is intense and abdomen is tender on that side. Says, "I can't bend or move and the pain is unbearable." Denies SOB/N/V/D. Has tried using magnesium sulfate and milk of magnesia d/t thinking s/s were d/t constipation. Did not relieve pain. Has been diagnosed with diverticulitis in the past. Has had appendectomy but says, "this pain is just like that." No other c/c. No active vomiting. Was told by [facility] to take 800 mg Ibuprofen but says this class of medications irritates his stomach d/t peptic ulcer. Also c/o left big toe pain. Denies fevers.

## 2015-08-17 NOTE — ED Notes (Signed)
Pt given urinal and instructed to provide sample.

## 2015-08-17 NOTE — Discharge Instructions (Signed)
Diverticulitis  Diverticulitis is when small pockets that have formed in your colon (large intestine) become infected or swollen.  HOME CARE  · Follow your doctor's instructions.  · Follow a special diet if told by your doctor.  · When you feel better, your doctor may tell you to change your diet. You may be told to eat a lot of fiber. Fruits and vegetables are good sources of fiber. Fiber makes it easier to poop (have bowel movements).  · Take supplements or probiotics as told by your doctor.  · Only take medicines as told by your doctor.  · Keep all follow-up visits with your doctor.  GET HELP IF:  · Your pain does not get better.  · You have a hard time eating food.  · You are not pooping like normal.  GET HELP RIGHT AWAY IF:  · Your pain gets worse.  · Your problems do not get better.  · Your problems suddenly get worse.  · You have a fever.  · You keep throwing up (vomiting).  · You have bloody or black, tarry poop (stool).  MAKE SURE YOU:   · Understand these instructions.  · Will watch your condition.  · Will get help right away if you are not doing well or get worse.     This information is not intended to replace advice given to you by your health care provider. Make sure you discuss any questions you have with your health care provider.     Document Released: 02/07/2008 Document Revised: 08/26/2013 Document Reviewed: 07/16/2013  Elsevier Interactive Patient Education ©2016 Elsevier Inc.

## 2015-08-27 DIAGNOSIS — Z13220 Encounter for screening for lipoid disorders: Secondary | ICD-10-CM | POA: Diagnosis not present

## 2015-08-27 DIAGNOSIS — F329 Major depressive disorder, single episode, unspecified: Secondary | ICD-10-CM | POA: Diagnosis not present

## 2015-08-27 DIAGNOSIS — Z131 Encounter for screening for diabetes mellitus: Secondary | ICD-10-CM | POA: Diagnosis not present

## 2015-08-27 DIAGNOSIS — K219 Gastro-esophageal reflux disease without esophagitis: Secondary | ICD-10-CM | POA: Diagnosis not present

## 2015-09-10 DIAGNOSIS — F312 Bipolar disorder, current episode manic severe with psychotic features: Secondary | ICD-10-CM | POA: Diagnosis not present

## 2015-10-01 DIAGNOSIS — F312 Bipolar disorder, current episode manic severe with psychotic features: Secondary | ICD-10-CM | POA: Diagnosis not present

## 2016-02-11 ENCOUNTER — Emergency Department (HOSPITAL_COMMUNITY)
Admission: EM | Admit: 2016-02-11 | Discharge: 2016-02-11 | Disposition: A | Discharge status: Home / Self Care | Payer: Medicare HMO | Attending: Emergency Medicine | Admitting: Emergency Medicine

## 2016-02-11 ENCOUNTER — Encounter (HOSPITAL_COMMUNITY): Payer: Self-pay | Admitting: Emergency Medicine

## 2016-02-11 DIAGNOSIS — R21 Rash and other nonspecific skin eruption: Secondary | ICD-10-CM | POA: Diagnosis present

## 2016-02-11 DIAGNOSIS — F17210 Nicotine dependence, cigarettes, uncomplicated: Secondary | ICD-10-CM | POA: Insufficient documentation

## 2016-02-11 DIAGNOSIS — L255 Unspecified contact dermatitis due to plants, except food: Secondary | ICD-10-CM | POA: Diagnosis not present

## 2016-02-11 DIAGNOSIS — Z79899 Other long term (current) drug therapy: Secondary | ICD-10-CM | POA: Diagnosis not present

## 2016-02-11 MED ORDER — HYDROXYZINE HCL 25 MG PO TABS: 25.0000 mg | ORAL_TABLET | Freq: Four times a day (QID) | ORAL | Status: DC

## 2016-02-11 MED ORDER — FAMOTIDINE 20 MG PO TABS: 20.0000 mg | ORAL_TABLET | Freq: Two times a day (BID) | ORAL | Status: DC

## 2016-02-11 MED ORDER — PREDNISONE 10 MG PO TABS: 20.0000 mg | ORAL_TABLET | Freq: Two times a day (BID) | ORAL | Status: DC

## 2016-02-11 MED ORDER — BETAMETHASONE SOD PHOS & ACET 6 (3-3) MG/ML IJ SUSP
6.0000 mg | Freq: Once | INTRAMUSCULAR | Status: AC
  Administered 2016-02-11: 6 mg via INTRAMUSCULAR
  Filled 2016-02-11: qty 1

## 2016-02-11 NOTE — ED Notes (Signed)
Pt in reports being in woods recently, now has rash to bilateral LE. Redness, itching.

## 2016-02-11 NOTE — ED Notes (Signed)
See EDP assessment 

## 2016-02-11 NOTE — ED Provider Notes (Signed)
CSN: [id number]     Arrival date & time 02/11/16  1933 History  By signing my name below, I, Antonio Lawson, attest that this documentation has been prepared under the direction and in the presence of [facility] M. Janit Bern, NP.  Electronically Signed: Tedra Coupe. Sheppard Coil, ED Scribe. 02/11/2016. 8:27 PM.     Chief Complaint  Patient presents with  . Poison Ivy    Patient is a 55 y.o. male presenting with poison ivy. The history is provided by the patient. No language interpreter was used.  Poison Antonio Lawson This is a new problem. The current episode started more than 2 days ago. The problem occurs constantly. The problem has been gradually worsening. Pertinent negatives include no shortness of breath. Nothing relieves the symptoms.    HPI Comments: Antonio Lawson is a 55 y.o. male who presents to the Emergency Department complaining of gradual onset, constant, pruritic and erythematous rash on bilateral lower legs x 5 days. He states he was doing yard work when he came into contact with possible [place] or [place]. Pt notes rash has spread to bilateral arms and face. He has had some itching of his genital area but no rash yet.  He also reports contact with dog, which also came into contact with plant as well. He has used Benadryl and calamine lotion with no relief of itching. Pt denies any throat swelling or SOB.  History reviewed. No pertinent past medical history. History reviewed. No pertinent past surgical history. Family History  Problem Relation Age of Onset  . Cancer Mother   . Heart attack Father    Social History  Substance Use Topics  . Smoking status: Current Every Day Smoker -- 1.00 packs/day    Types: Cigarettes  . Smokeless tobacco: None  . Alcohol Use: No    Review of Systems  Constitutional: Negative for fever.  Respiratory: Negative for shortness of breath.   Skin: Positive for rash.  all other systems negative  Allergies  Review of patient's allergies indicates no  known allergies.  Home Medications   Prior to Admission medications   Medication Sig Start Date End Date Taking? Authorizing Provider  ciprofloxacin (CIPRO) 500 MG tablet Take 1 tablet (500 mg total) by mouth 2 (two) times daily. One po bid x 7 days 08/17/15   Deno Etienne, DO  famotidine (PEPCID) 20 MG tablet Take 1 tablet (20 mg total) by mouth 2 (two) times daily. 02/11/16   Saim Almanza Bunnie Pion, NP  hydrOXYzine (ATARAX/VISTARIL) 25 MG tablet Take 1 tablet (25 mg total) by mouth every 6 (six) hours. 02/11/16   Abbey Veith Bunnie Pion, NP  meloxicam (MOBIC) 15 MG tablet Take 0.5-1 tablets (7.5-15 mg total) by mouth daily. Patient not taking: Reported on 08/17/2015 04/26/15   Waldemar Dickens, MD  methocarbamol (ROBAXIN) 500 MG tablet Take 1-2 tablets (500-1,000 mg total) by mouth every 6 (six) hours as needed for muscle spasms. Patient not taking: Reported on 08/17/2015 04/26/15   Waldemar Dickens, MD  metroNIDAZOLE (FLAGYL) 500 MG tablet Take 1 tablet (500 mg total) by mouth 2 (two) times daily. One po bid x 7 days 08/17/15   Deno Etienne, DO  oxyCODONE (ROXICODONE) 5 MG immediate release tablet Take 1 tablet (5 mg total) by mouth every 4 (four) hours as needed for severe pain. 08/17/15   Deno Etienne, DO  predniSONE (DELTASONE) 10 MG tablet Take 2 tablets (20 mg total) by mouth 2 (two) times daily with a meal. 02/11/16  Yameli Delamater Bunnie Pion, NP  QUEtiapine (SEROQUEL) 100 MG tablet Take 100 mg by mouth at bedtime.    Historical Provider, MD   BP 131/86 mmHg  Pulse 97  Temp(Src) 98.5 F (36.9 C) (Oral)  Resp 16  Ht 6\' 1"  (1.854 m)  Wt 102.967 kg  BMI 29.96 kg/m2  SpO2 94% Physical Exam  Constitutional: He is oriented to person, place, and time. He appears well-developed and well-nourished.  HENT:  Head: Normocephalic and atraumatic.  Eyes: Conjunctivae and EOM are normal. Pupils are equal, round, and reactive to light.  Neck: Normal range of motion. Neck supple.  Cardiovascular: Normal rate and regular rhythm.    Pulmonary/Chest: Effort normal and breath sounds normal. No respiratory distress. He has no wheezes. He has no rales.  Abdominal: Soft. There is no tenderness.  Musculoskeletal: Normal range of motion.  Neurological: He is alert and oriented to person, place, and time.  Skin: Skin is warm and dry. Rash noted.  Linear vesicular rash to face, bilateral forearms, and lower legs. No red streaking or signs of infection  Psychiatric: He has a normal mood and affect. His behavior is normal. Judgment normal.  Nursing note and vitals reviewed.   ED Course  Procedures (including critical care time) DIAGNOSTIC STUDIES: Oxygen Saturation is 94% on RA, adequate by my interpretation.    COORDINATION OF CARE: 8:24 PM-Discussed treatment plan which includes steroid injection with pt at bedside and pt agreed to plan.    MDM   Final diagnoses:  Dermatitis due to plants, including poison ivy, sum[place], and oak   Patient with contact dermatitis. Instructed to avoid offending agent. Will treat with Celestone Soluspan 6 mg IM, Rx Atarax, Pepcid and Prednisone. No signs of secondary infection. Follow up with if symptoms worsen in 2-3 days. Return precautions discussed. Pt is safe for discharge at this time.  I personally performed the services described in this documentation, which was scribed in my presence. The recorded information has been reviewed and is accurate.   [facility], [place] 02/12/16 [place], MD 02/18/16 [phone]

## 2016-02-11 NOTE — Discharge Instructions (Signed)
Poison Ivy Poison ivy is a rash caused by touching the leaves of the poison ivy plant. The rash often shows up 48 hours later. You might just have bumps, redness, and itching. Sometimes, blisters appear and break open. Your eyes may get puffy (swollen). Poison ivy often heals in 2 to 3 weeks without treatment. HOME CARE  If you touch poison ivy:  Wash your skin with soap and water right away. Wash under your fingernails. Do not rub the skin very hard.  Wash any clothes you were wearing.  Avoid poison ivy in the future. Poison ivy has 3 leaves on a stem.  Use medicine to help with itching as told by your doctor. Do not drive when you take this medicine.  Keep open sores dry, clean, and covered with a bandage and medicated cream, if needed.  Ask your doctor about medicine for children. GET HELP RIGHT AWAY IF:  You have open sores.  Redness spreads beyond the area of the rash.  There is yellowish white fluid (pus) coming from the rash.  Pain gets worse.  You have a temperature by mouth above 102 F (38.9 C), not controlled by medicine. MAKE SURE YOU:  Understand these instructions.  Will watch your condition.  Will get help right away if you are not doing well or get worse.   This information is not intended to replace advice given to you by your health care provider. Make sure you discuss any questions you have with your health care provider.   Document Released: 09/23/2010 Document Revised: 11/13/2011 Document Reviewed: 01/27/2015 Elsevier Interactive Patient Education 2016 Elsevier Inc.  

## 2016-03-13 DIAGNOSIS — J00 Acute nasopharyngitis [common cold]: Secondary | ICD-10-CM | POA: Diagnosis not present

## 2016-03-28 ENCOUNTER — Encounter: Payer: Self-pay | Admitting: Family

## 2016-03-28 ENCOUNTER — Ambulatory Visit (INDEPENDENT_AMBULATORY_CARE_PROVIDER_SITE_OTHER): Payer: Commercial Managed Care - HMO | Admitting: Family

## 2016-03-28 VITALS — BP 118/84 | HR 88 | Temp 97.7°F | Resp 16 | Ht 73.0 in | Wt 227.0 lb

## 2016-03-28 DIAGNOSIS — K219 Gastro-esophageal reflux disease without esophagitis: Secondary | ICD-10-CM | POA: Insufficient documentation

## 2016-03-28 DIAGNOSIS — F17200 Nicotine dependence, unspecified, uncomplicated: Secondary | ICD-10-CM

## 2016-03-28 DIAGNOSIS — R198 Other specified symptoms and signs involving the digestive system and abdomen: Secondary | ICD-10-CM | POA: Diagnosis not present

## 2016-03-28 DIAGNOSIS — F319 Bipolar disorder, unspecified: Secondary | ICD-10-CM

## 2016-03-28 DIAGNOSIS — G479 Sleep disorder, unspecified: Secondary | ICD-10-CM | POA: Insufficient documentation

## 2016-03-28 DIAGNOSIS — K2100 Gastro-esophageal reflux disease with esophagitis, without bleeding: Secondary | ICD-10-CM

## 2016-03-28 DIAGNOSIS — K649 Unspecified hemorrhoids: Secondary | ICD-10-CM

## 2016-03-28 DIAGNOSIS — E663 Overweight: Secondary | ICD-10-CM

## 2016-03-28 MED ORDER — HYDROCORTISONE 2.5 % RE CREA: 1.0000 "application " | TOPICAL_CREAM | Freq: Two times a day (BID) | RECTAL | 0 refills | Status: DC

## 2016-03-28 MED ORDER — RANITIDINE HCL 300 MG PO TABS: 300.0000 mg | ORAL_TABLET | Freq: Every day | ORAL | 0 refills | Status: DC

## 2016-03-28 NOTE — Patient Instructions (Addendum)
Thank you for choosing [facility].  Summary/Instructions:  Please continue to take your medications as prescribed.  Start the Anusol cream as needed for burning/itching.  Avoid constipation and excessive wiping.  They will call to schedule your appointment with gastroenterology.  Your prescription(s) have been submitted to your pharmacy or been printed and provided for you. Please take as directed and contact our office if you believe you are having problem(s) with the medication(s) or have any questions.  If your symptoms worsen or fail to improve, please contact our office for further instruction, or in case of emergency go directly to the emergency room at the closest medical facility.    Food Choices for Gastroesophageal Reflux Disease, Adult When you have gastroesophageal reflux disease (GERD), the foods you eat and your eating habits are very important. Choosing the right foods can help ease the discomfort of GERD. WHAT GENERAL GUIDELINES DO I NEED TO FOLLOW?  Choose fruits, vegetables, whole grains, low-fat dairy products, and low-fat meat, fish, and poultry.  Limit fats such as oils, salad dressings, butter, nuts, and avocado.  Keep a food diary to identify foods that cause symptoms.  Avoid foods that cause reflux. These may be different for different people.  Eat frequent small meals instead of three large meals each day.  Eat your meals slowly, in a relaxed setting.  Limit fried foods.  Cook foods using methods other than frying.  Avoid drinking alcohol.  Avoid drinking large amounts of liquids with your meals.  Avoid bending over or lying down until 2-3 hours after eating. WHAT FOODS ARE NOT RECOMMENDED? The following are some foods and drinks that may worsen your symptoms: Vegetables Tomatoes. Tomato juice. Tomato and spaghetti sauce. Chili peppers. Onion and garlic. Horseradish. Fruits Oranges, grapefruit, and lemon (fruit and  juice). Meats High-fat meats, fish, and poultry. This includes hot dogs, ribs, ham, sausage, salami, and bacon. Dairy Whole milk and chocolate milk. Sour cream. Cream. Butter. Ice cream. Cream cheese.  Beverages Coffee and tea, with or without caffeine. Carbonated beverages or energy drinks. Condiments Hot sauce. Barbecue sauce.  Sweets/Desserts Chocolate and cocoa. Donuts. Peppermint and spearmint. Fats and Oils High-fat foods, including [place] fries and potato chips. Other Vinegar. Strong spices, such as black pepper, white pepper, red pepper, cayenne, curry powder, cloves, ginger, and chili powder. The items listed above may not be a complete list of foods and beverages to avoid. Contact your dietitian for more information.   This information is not intended to replace advice given to you by your health care provider. Make sure you discuss any questions you have with your health care provider.   Document Released: 08/21/2005 Document Revised: 09/11/2014 Document Reviewed: 06/25/2013 Elsevier Interactive Patient Education 2016 [employer].  Recommendations for improving sleep:   Avoid having pets sleep in the bedroom  Avoid caffeine consumption after 4pm  Keep bedroom cool and conducive to sleep  Avoid nicotine use, especially in the evening  Avoid exercise within 2-3 hours before bedtime  Stimulus Control:   Go to bed only when sleepy  Use the bedroom for sleep and sex only  Go to another room if you are unable to fall asleep within 15 to 20 minutes  Read or engage in other quiet activities and return to bed only when sleepy.

## 2016-03-28 NOTE — Assessment & Plan Note (Signed)
Recommend weight loss of 5% of current body weight through nutrition and physical activity. Recommend increasing physical activity to 30 minutes of moderate level activity daily. Encourage nutritional intake that focuses on nutrient dense foods and is moderate, varied, and balanced and is low in saturated fats and processed/sugary foods. Continue to monitor.

## 2016-03-28 NOTE — Assessment & Plan Note (Signed)
Burning and itching consistent with internal hemorrhoid noted upon exam. Most likely exacerbated from the anal leakage. Treatments have been refractory to OTC medications. Start Anusol cream. Discussed importance of good rectal hygiene. Considering possible hemorrhoid removal. Follow up if symptoms worsen or do not improve.

## 2016-03-28 NOTE — Assessment & Plan Note (Signed)
Bipolar 1 with questionable medication regimen and managed by psychiatry. Appears somewhat manic today and thoughts appear racing. Advised to follow-up with psychiatry for maintenance and changes.

## 2016-03-28 NOTE — Assessment & Plan Note (Signed)
Chronic sleep disturbance currently managed with quetiapine and averaging approximately 7-8 hours of sleep per night. Notes associated side effect of increased weight which is common with quetiapine. He is apparently on other medications for bipolar which she was very hesitant to discuss. Instructed to follow-up with psychiatry for possible changes to sleep medication. Discussed importance of good sleep hygiene.

## 2016-03-28 NOTE — Progress Notes (Signed)
Subjective:    Patient ID: Antonio Lawson, male    DOB: 1960-12-23, 55 y.o.   MRN: [id number]  Chief Complaint  Patient presents with  . Establish Care    has issues with what he was told was hemerrhoids since 2011, has issues with "leakage" and raw feeling back there, wants to talk about another medication besides seroquel bc he is eating alot     HPI:  Antonio Lawson is a 55 y.o. male who  has a past medical history of Bipolar 1 disorder ([place]); Chicken pox; Diverticulitis; and GERD (gastroesophageal reflux disease). and presents today for an office visit to establish care.  1.) Rectal Issues / Diverticulitis -This is a new chronic problem. Previously diagnosed with diverticulitis following a visit to the ED in December 2016 when he was experiencing left lower quadrant abdominal pain which was confirmed with CT scan that showed uncomplicated diverticulitis of the descending colon and advanced diverticulosis of the sigmoid colon. He was also noted to have mild diffuse fatty infiltration of the liver. He was treated with metronidazole and ciprofloxacin. Continues to experience the associated symptoms of burning, itching and rawness located around his rectum which he states has been going on since 2011. Describes anal leakage on occasion that is uncontrolled with a severity that is effecting his lifestyle. Describes that it feels like sandpaper on occasion. Did have a colonoscopy in 2012 which he reports was normal. No changes to appetite, fevers, nausea, vomiting or constipation. No abdominal pain currently. Modifying factors include Preparation H products which help a little. No blood in stool or around rectum.   2.) Insomnia - This is a chronic problem. Currently maintained on the quetiapine. Reports taking the medication as prescribed and notes that he feels increased hunger with his medication. Currently sleeping approximately 7-8 hours per night. Without the medication he is not able to  fall asleep. Has not taken any other medications.   3.) GERD - Previously diagnosed with GERD and currently maintained on esomeprazole over the counter which he indicates is no longer helping with his symptoms. Does report taking the medication as prescribed.    No Known Allergies   Outpatient Medications Prior to Visit  Medication Sig Dispense Refill  . QUEtiapine (SEROQUEL) 100 MG tablet Take 100 mg by mouth at bedtime.    . ciprofloxacin (CIPRO) 500 MG tablet Take 1 tablet (500 mg total) by mouth 2 (two) times daily. One po bid x 7 days 14 tablet 0  . famotidine (PEPCID) 20 MG tablet Take 1 tablet (20 mg total) by mouth 2 (two) times daily. 30 tablet 0  . hydrOXYzine (ATARAX/VISTARIL) 25 MG tablet Take 1 tablet (25 mg total) by mouth every 6 (six) hours. 20 tablet 0  . meloxicam (MOBIC) 15 MG tablet Take 0.5-1 tablets (7.5-15 mg total) by mouth daily. (Patient not taking: Reported on 08/17/2015) 30 tablet 0  . methocarbamol (ROBAXIN) 500 MG tablet Take 1-2 tablets (500-1,000 mg total) by mouth every 6 (six) hours as needed for muscle spasms. (Patient not taking: Reported on 08/17/2015) 60 tablet 0  . metroNIDAZOLE (FLAGYL) 500 MG tablet Take 1 tablet (500 mg total) by mouth 2 (two) times daily. One po bid x 7 days 14 tablet 0  . oxyCODONE (ROXICODONE) 5 MG immediate release tablet Take 1 tablet (5 mg total) by mouth every 4 (four) hours as needed for severe pain. 5 tablet 0  . predniSONE (DELTASONE) 10 MG tablet Take 2 tablets (20 mg total) by mouth  2 (two) times daily with a meal. 16 tablet 0   No facility-administered medications prior to visit.      Past Medical History:  Diagnosis Date  . Bipolar 1 disorder ([place])   . Chicken pox   . Diverticulitis   . GERD (gastroesophageal reflux disease)      Past Surgical History:  Procedure Laterality Date  . APPENDECTOMY    . HERNIA REPAIR       Family History  Problem Relation Age of Onset  . Pancreatic cancer Mother   . Heart  attack Father   . Stroke Father      Social History   Social History  . Marital status: Single    Spouse name: N/A  . Number of children: 0  . Years of education: 12   Occupational History  . Disability     [employer]el   Social History Main Topics  . Smoking status: Current Every Day Smoker    Packs/day: 1.00    Types: Cigarettes  . Smokeless tobacco: Never Used  . Alcohol use No  . Drug use: No  . Sexual activity: Not on file   Other Topics Concern  . Not on file   Social History Narrative  . No narrative on file     Review of Systems  Constitutional: Negative for chills and fever.  Gastrointestinal: Negative for abdominal pain, anal bleeding, blood in stool, constipation, diarrhea, nausea and vomiting.       Positive for anal leakage and rectal itching/burning.    Psychiatric/Behavioral: Positive for decreased concentration. The patient is nervous/anxious.       Objective:    BP 118/84 (BP Location: Left Arm, Patient Position: Sitting, Cuff Size: Large)   Pulse 88   Temp 97.7 F (36.5 C) (Oral)   Resp 16   Ht 6\' 1"  (1.854 m)   Wt 227 lb (103 kg)   SpO2 96%   BMI 29.95 kg/m  Nursing note and vital signs reviewed.  Physical Exam  Constitutional: He is oriented to person, place, and time. He appears well-developed and well-nourished. No distress.  Cardiovascular: Normal rate, regular rhythm, normal heart sounds and intact distal pulses.   Pulmonary/Chest: Effort normal and breath sounds normal.  Genitourinary: Rectal exam shows internal hemorrhoid and tenderness. Rectal exam shows no external hemorrhoid, no fissure and no mass.  Neurological: He is alert and oriented to person, place, and time.  Skin: Skin is warm and dry.  Psychiatric: He has a normal mood and affect. His behavior is normal. Judgment and thought content normal.       Assessment & Plan:   Problem List Items Addressed This Visit      Cardiovascular and Mediastinum   Hemorrhoids     Burning and itching consistent with internal hemorrhoid noted upon exam. Most likely exacerbated from the anal leakage. Treatments have been refractory to OTC medications. Start Anusol cream. Discussed importance of good rectal hygiene. Considering possible hemorrhoid removal. Follow up if symptoms worsen or do not improve.       Relevant Medications   hydrocortisone (ANUSOL-HC) 2.5 % rectal cream   Other Relevant Orders   Ambulatory referral to Gastroenterology     Digestive   GERD (gastroesophageal reflux disease)    Symptoms consistent with GERD uncontrolled with OTC medications. Start ranitidine. Information on GERD diet provided. Follow up if symptoms are not improved with medication and diet changes.       Relevant Medications   ranitidine (ZANTAC) 300 MG tablet  Other   Anal discharge - Primary    Anal exam is with good sphincter tone and a small amount of stool present upon exam. Refer to gastroenterology for further assessment and possible colonoscopy given change in bowel habits. Continue to monitor.       Relevant Orders   Ambulatory referral to Gastroenterology   Sleep disturbance    Chronic sleep disturbance currently managed with quetiapine and averaging approximately 7-8 hours of sleep per night. Notes associated side effect of increased weight which is common with quetiapine. He is apparently on other medications for bipolar which she was very hesitant to discuss. Instructed to follow-up with psychiatry for possible changes to sleep medication. Discussed importance of good sleep hygiene.      Bipolar I disorder ([place])    Bipolar 1 with questionable medication regimen and managed by psychiatry. Appears somewhat manic today and thoughts appear racing. Advised to follow-up with psychiatry for maintenance and changes.      Overweight (BMI 25.0-29.9)    Recommend weight loss of 5% of current body weight through nutrition and physical activity. Recommend increasing  physical activity to 30 minutes of moderate level activity daily. Encourage nutritional intake that focuses on nutrient dense foods and is moderate, varied, and balanced and is low in saturated fats and processed/sugary foods. Continue to monitor.       Tobacco use disorder    Continues to smoke about 1 pack per day with no interest in tobacco cessation presently. Education and emphasized continued risks for chronic disease and malignancy associated with continued tobacco use. Continue to monitor.        Other Visit Diagnoses   None.      I have discontinued Mr. Karney's meloxicam, methocarbamol, oxyCODONE, ciprofloxacin, metroNIDAZOLE, predniSONE, hydrOXYzine, and famotidine. I am also having him start on hydrocortisone and ranitidine. Additionally, I am having him maintain his QUEtiapine.   Meds ordered this encounter  Medications  . hydrocortisone (ANUSOL-HC) 2.5 % rectal cream    Sig: Place 1 application rectally 2 (two) times daily.    Dispense:  30 g    Refill:  0    Order Specific Question:   Supervising Provider    Answer:   Pricilla Holm A [id number]  . ranitidine (ZANTAC) 300 MG tablet    Sig: Take 1 tablet (300 mg total) by mouth at bedtime.    Dispense:  30 tablet    Refill:  0    Order Specific Question:   Supervising Provider    Answer:   Pricilla Holm A [id number]     Follow-up: Return in about 1 month (around 04/28/2016), or if symptoms worsen or fail to improve.  Mauricio Po, FNP

## 2016-03-28 NOTE — Assessment & Plan Note (Signed)
Anal exam is with good sphincter tone and a small amount of stool present upon exam. Refer to gastroenterology for further assessment and possible colonoscopy given change in bowel habits. Continue to monitor.

## 2016-03-28 NOTE — Assessment & Plan Note (Signed)
Symptoms consistent with GERD uncontrolled with OTC medications. Start ranitidine. Information on GERD diet provided. Follow up if symptoms are not improved with medication and diet changes.

## 2016-03-28 NOTE — Assessment & Plan Note (Signed)
Continues to smoke about 1 pack per day with no interest in tobacco cessation presently. Education and emphasized continued risks for chronic disease and malignancy associated with continued tobacco use. Continue to monitor.

## 2016-04-25 ENCOUNTER — Telehealth: Payer: Self-pay | Admitting: Emergency Medicine

## 2016-04-25 ENCOUNTER — Other Ambulatory Visit: Payer: Self-pay | Admitting: Family

## 2016-04-25 DIAGNOSIS — K2100 Gastro-esophageal reflux disease with esophagitis, without bleeding: Secondary | ICD-10-CM

## 2016-04-25 MED ORDER — RANITIDINE HCL 300 MG PO TABS: 300.0000 mg | ORAL_TABLET | Freq: Every day | ORAL | 0 refills | Status: DC

## 2016-04-25 NOTE — Telephone Encounter (Signed)
Pt called and needs a prescription refill on ranitidine (ZANTAC) 300 MG tablet. Pharmacy is [facility] Please follow up thanks.

## 2016-04-26 NOTE — Telephone Encounter (Signed)
Rx sent pt aware 

## 2016-05-05 DIAGNOSIS — F312 Bipolar disorder, current episode manic severe with psychotic features: Secondary | ICD-10-CM | POA: Diagnosis not present

## 2016-05-17 ENCOUNTER — Encounter: Payer: Self-pay | Admitting: Gastroenterology

## 2016-05-24 ENCOUNTER — Ambulatory Visit: Payer: Commercial Managed Care - HMO | Admitting: Gastroenterology

## 2016-06-01 ENCOUNTER — Encounter: Payer: Self-pay | Admitting: Family

## 2016-06-01 ENCOUNTER — Ambulatory Visit (INDEPENDENT_AMBULATORY_CARE_PROVIDER_SITE_OTHER): Payer: Commercial Managed Care - HMO | Admitting: Family

## 2016-06-01 DIAGNOSIS — F411 Generalized anxiety disorder: Secondary | ICD-10-CM | POA: Diagnosis not present

## 2016-06-01 DIAGNOSIS — M2550 Pain in unspecified joint: Secondary | ICD-10-CM

## 2016-06-01 MED ORDER — TRAMADOL HCL 50 MG PO TABS: 50.0000 mg | ORAL_TABLET | Freq: Three times a day (TID) | ORAL | 0 refills | Status: DC | PRN

## 2016-06-01 MED ORDER — ALPRAZOLAM 0.5 MG PO TABS: 0.5000 mg | ORAL_TABLET | Freq: Two times a day (BID) | ORAL | 0 refills | Status: DC | PRN

## 2016-06-01 MED ORDER — PREDNISONE 10 MG (21) PO TBPK: ORAL_TABLET | ORAL | 0 refills | Status: DC

## 2016-06-01 NOTE — Patient Instructions (Signed)
Thank you for choosing [facility].  SUMMARY AND INSTRUCTIONS:  Ice x 20 minutes every 2 hours as needed for discomfort. May use moist heat on occasion.   Start the predisone taper.   Tramadol as needed for pain.  Start the alprazolam as needed for anxiety.  Please check out Dr. Felicie Morn for orthotic support.   Medication:  Your prescription(s) have been submitted to your pharmacy or been printed and provided for you. Please take as directed and contact our office if you believe you are having problem(s) with the medication(s) or have any questions. .   Follow up:  If your symptoms worsen or fail to improve, please contact our office for further instruction, or in case of emergency go directly to the emergency room at the closest medical facility.

## 2016-06-01 NOTE — Assessment & Plan Note (Signed)
Arthralgias of the left side including the great toe, knee, and hip with no significant findings and concern for possible biomechanical abnormality resulting akinetic chain breakdown. Recommend orthotics. Treat conservatively with ice and home exercise therapy. Start prednisone Dosepak. Start tramadol as needed for pain. Follow-up if symptoms worsen or do not improve.

## 2016-06-01 NOTE — Assessment & Plan Note (Signed)
Previously diagnosed with anxiety and maintained on alprazolam. Discussed importance of stress and stress management. There is some concern for his underlying bipolar as he is not currently maintained on medications. Start alprazolam as needed for anxiety. [place] controlled substance database reviewed with no irregularities. Continue to monitor.

## 2016-06-01 NOTE — Progress Notes (Signed)
Subjective:    Patient ID: Antonio Lawson, male    DOB: [birth year], 55 y.o.   MRN: [id number]  Chief Complaint  Patient presents with  . Toe Pain    left big toe pain, hip pain, knee pain, wants something for pain    HPI:  Antonio Lawson is a 55 y.o. male who  has a past medical history of Bipolar 1 disorder ([place]); Chicken pox; Diverticulitis; and GERD (gastroesophageal reflux disease). and presents today for an office visit.    This is a new problem. Associated symptoms of pain located in his left big toe, knee and hip has been going on for about 2 months. Pains are described as constant chronic pain that are effecting his ability to walk and describes that he is having difficulty even walking the dog. Denies any trauma to any of these areas. Modifying factors include ice, ibuprofen, Epson salt and Aleve which have not provided much relief. He was previously prescribed Xanax as needed for anxiety and for pains. No sounds/sensations heard or felt in any of these areas. No back pain currently.    No Known Allergies    Outpatient Medications Prior to Visit  Medication Sig Dispense Refill  . hydrocortisone (ANUSOL-HC) 2.5 % rectal cream Place 1 application rectally 2 (two) times daily. 30 g 0  . QUEtiapine (SEROQUEL) 100 MG tablet Take 100 mg by mouth at bedtime.    . ranitidine (ZANTAC) 300 MG tablet Take 1 tablet (300 mg total) by mouth at bedtime. 30 tablet 0   No facility-administered medications prior to visit.       Past Surgical History:  Procedure Laterality Date  . APPENDECTOMY    . HERNIA REPAIR        Past Medical History:  Diagnosis Date  . Bipolar 1 disorder ([place])   . Chicken pox   . Diverticulitis   . GERD (gastroesophageal reflux disease)       Review of Systems  Constitutional: Negative for chills and fever.  Musculoskeletal: Positive for arthralgias.  Neurological: Negative for weakness and numbness.      Objective:    BP 124/74 (BP  Location: Left Arm, Patient Position: Sitting, Cuff Size: Large)   Pulse 89   Temp 97.9 F (36.6 C) (Oral)   Resp 18   Ht 6\' 1"  (1.854 m)   Wt 224 lb (101.6 kg)   SpO2 97%   BMI 29.55 kg/m  Nursing note and vital signs reviewed.  Physical Exam  Constitutional: He is oriented to person, place, and time. He appears well-developed and well-nourished. No distress.  Cardiovascular: Normal rate, regular rhythm, normal heart sounds and intact distal pulses.   Pulmonary/Chest: Effort normal and breath sounds normal.  Musculoskeletal:  Left great toe - no obvious deformity, discoloration, or edema noted. Tenderness over the proximal interphalangeal joint with no crepitus or deformity. Range of motion within normal limits. Capillary refill intact and appropriate. Left knee  - no obvious deformity, discoloration, or edema. No palpable tenderness able to be elicited. No crepitus or deformity. Range of motion within normal limits. Strength is normal. Meniscal and ligamentous testing are negative. Distal pulses and sensation are intact and appropriate. Left hip  - no obvious deformity, discoloration, or edema. No palpable tenderness able to be elicited. No crepitus or deformity. Range of motion within normal limits with discomfort noted in abduction. All other motions normal. Strength is normal. Distal pulses and sensation are intact and appropriate.   Neurological: He is alert and  oriented to person, place, and time.  Skin: Skin is warm and dry.  Psychiatric: He has a normal mood and affect. His behavior is normal. Judgment and thought content normal.       Assessment & Plan:   Problem List Items Addressed This Visit      Other   Arthralgia    Arthralgias of the left side including the great toe, knee, and hip with no significant findings and concern for possible biomechanical abnormality resulting akinetic chain breakdown. Recommend orthotics. Treat conservatively with ice and home exercise  therapy. Start prednisone Dosepak. Start tramadol as needed for pain. Follow-up if symptoms worsen or do not improve.      Relevant Medications   predniSONE (STERAPRED UNI-PAK 21 TAB) 10 MG (21) TBPK tablet   traMADol (ULTRAM) 50 MG tablet   Anxiety state    Previously diagnosed with anxiety and maintained on alprazolam. Discussed importance of stress and stress management. There is some concern for his underlying bipolar as he is not currently maintained on medications. Start alprazolam as needed for anxiety. [place] controlled substance database reviewed with no irregularities. Continue to monitor.      Relevant Medications   ALPRAZolam (XANAX) 0.5 MG tablet    Other Visit Diagnoses   None.      I am having Mr. Hergott start on predniSONE, ALPRAZolam, and traMADol. I am also having him maintain his QUEtiapine, hydrocortisone, and ranitidine.   Meds ordered this encounter  Medications  . predniSONE (STERAPRED UNI-PAK 21 TAB) 10 MG (21) TBPK tablet    Sig: Take 6 tablets x 1 day, 5 tablets x 1 day, 4 tablets x 1 day, 3 tablets x 1 day, 2 tablets x 1 day, 1 tablet x 1 day    Dispense:  21 tablet    Refill:  0    Order Specific Question:   Supervising Provider    Answer:   Pricilla Holm A [id number]  . ALPRAZolam (XANAX) 0.5 MG tablet    Sig: Take 1 tablet (0.5 mg total) by mouth 2 (two) times daily as needed for anxiety.    Dispense:  60 tablet    Refill:  0    Order Specific Question:   Supervising Provider    Answer:   Pricilla Holm A [id number]  . traMADol (ULTRAM) 50 MG tablet    Sig: Take 1 tablet (50 mg total) by mouth every 8 (eight) hours as needed.    Dispense:  30 tablet    Refill:  0    Order Specific Question:   Supervising Provider    Answer:   Pricilla Holm A [id number]     Follow-up: Return if symptoms worsen or fail to improve.  Mauricio Po, FNP

## 2016-06-07 ENCOUNTER — Encounter: Payer: Self-pay | Admitting: Gastroenterology

## 2016-06-07 ENCOUNTER — Ambulatory Visit (INDEPENDENT_AMBULATORY_CARE_PROVIDER_SITE_OTHER): Payer: Commercial Managed Care - HMO | Admitting: Gastroenterology

## 2016-06-07 VITALS — BP 120/70 | HR 78 | Ht 73.0 in | Wt 229.8 lb

## 2016-06-07 DIAGNOSIS — R159 Full incontinence of feces: Secondary | ICD-10-CM | POA: Insufficient documentation

## 2016-06-07 DIAGNOSIS — L290 Pruritus ani: Secondary | ICD-10-CM | POA: Diagnosis not present

## 2016-06-07 NOTE — Patient Instructions (Addendum)
You have a follow up appointment with Dr Hilarie Fredrickson on  08/15/2016 at 2:30pm  Start Daily powder fiber supplement  Use Desitin or equivalent (Zinc Oxide) before bed in the AM and after bowel movements   We will try and obtain your records from [place]

## 2016-06-07 NOTE — Progress Notes (Addendum)
06/07/2016 Dreux Karlovich [id number] [birth year]   HISTORY OF PRESENT ILLNESS:  This is a 55 year old male who is new to our practice.  Referred here by Terri Piedra, FNP, for complaints of anal itching/irritation.  Says that he's had these issues since 2011 and has seen multiple people.  Complains of leakage of stool in his underwear on a daily basis.  Very itchy and irritated on his bottom.  Had a colonoscopy in 2010 in [place] that was reportedly normal but we are going to try to obtain those results.  Has tried sitz baths with Epsom salts and preparation H, which has helped to some degree.  Denies rectal bleeding.  No diarrhea, but sometimes stools are soft "like clay".  CT scan 08/2015 showed extensive diverticulosis and he actually had diverticulitis at that time.   Past Medical History:  Diagnosis Date  . Bipolar 1 disorder ([place])   . Chicken pox   . Diverticulitis   . GERD (gastroesophageal reflux disease)    Past Surgical History:  Procedure Laterality Date  . APPENDECTOMY    . HERNIA REPAIR      reports that he has been smoking Cigarettes.  He has been smoking about 1.00 pack per day. He has never used smokeless tobacco. He reports that he does not drink alcohol or use drugs. family history includes Heart attack in his father; Pancreatic cancer in his mother; Stroke in his father. No Known Allergies    Outpatient Encounter Prescriptions as of 06/07/2016  Medication Sig  . ALPRAZolam (XANAX) 0.5 MG tablet Take 1 tablet (0.5 mg total) by mouth 2 (two) times daily as needed for anxiety.  Marland Kitchen QUEtiapine (SEROQUEL) 100 MG tablet Take 100 mg by mouth at bedtime.  . ranitidine (ZANTAC) 300 MG tablet Take 1 tablet (300 mg total) by mouth at bedtime.  . traMADol (ULTRAM) 50 MG tablet Take 1 tablet (50 mg total) by mouth every 8 (eight) hours as needed.  . predniSONE (STERAPRED UNI-PAK 21 TAB) 10 MG (21) TBPK tablet Take 6 tablets x 1 day, 5 tablets x 1 day, 4 tablets x 1 day, 3 tablets  x 1 day, 2 tablets x 1 day, 1 tablet x 1 day (Patient not taking: Reported on 06/07/2016)  . [DISCONTINUED] hydrocortisone (ANUSOL-HC) 2.5 % rectal cream Place 1 application rectally 2 (two) times daily. (Patient not taking: Reported on 06/07/2016)   No facility-administered encounter medications on file as of 06/07/2016.      REVIEW OF SYSTEMS  : All other systems reviewed and negative except where noted in the History of Present Illness.   PHYSICAL EXAM: BP 120/70   Pulse 78   Ht 6\' 1"  (1.854 m)   Wt 229 lb 12.8 oz (104.2 kg)   BMI 30.32 kg/m  General: Well developed white male in no acute distress Head: Normocephalic and atraumatic Eyes:  Sclerae anicteric, conjunctiva pink. Ears: Normal auditory acuity Lungs: Clear throughout to auscultation Heart: Regular rate and rhythm Abdomen: Soft, non-distended.  Normal bowel sounds.  Non-tender. Rectal:  No external hemorrhoids noted.  Had some perianal irritation.  DRE did not reveal any masses.  Has poor sphincter tone.  Brown stool seen around anus an on exam glove that was heme negative. Musculoskeletal: Symmetrical with no gross deformities  Skin: No lesions on visible extremities Extremities: No edema  Neurological: Alert oriented x 4, grossly non-focal Psychological:  Alert and cooperative. Normal mood and affect  ASSESSMENT AND PLAN: -55 year old male with  complaints of stool/anal leakage, which is causing peri-anal irritation.  Will start daily powder fiber supplement to help bulk his stools.  Will try zinc oxide product to be applied to perianal area multiple times per day.  Avoid overaggressive hygiene.  Follow-up in 2-3 months.  Will obtain colonoscopy records from [place].   CC:  [place], FNP  Addendum: Reviewed and agree with initial management. Jerene Bears, MD

## 2016-07-04 ENCOUNTER — Telehealth: Payer: Self-pay | Admitting: *Deleted

## 2016-07-04 DIAGNOSIS — F411 Generalized anxiety disorder: Secondary | ICD-10-CM

## 2016-07-04 NOTE — Telephone Encounter (Signed)
Left msg on triage requesting refill on his Xanax...Antonio Lawson

## 2016-07-05 MED ORDER — ALPRAZOLAM 0.5 MG PO TABS: 0.5000 mg | ORAL_TABLET | Freq: Two times a day (BID) | ORAL | 1 refills | Status: DC | PRN

## 2016-07-05 NOTE — Telephone Encounter (Signed)
Called pt no answer LMOM rx faxed to [facility]...Antonio Lawson

## 2016-07-05 NOTE — Telephone Encounter (Signed)
Medication refilled

## 2016-07-14 ENCOUNTER — Encounter: Payer: Self-pay | Admitting: *Deleted

## 2016-08-15 ENCOUNTER — Ambulatory Visit: Payer: Commercial Managed Care - HMO | Admitting: Internal Medicine

## 2016-08-22 DIAGNOSIS — F312 Bipolar disorder, current episode manic severe with psychotic features: Secondary | ICD-10-CM | POA: Diagnosis not present

## 2016-09-05 ENCOUNTER — Ambulatory Visit: Payer: Commercial Managed Care - HMO | Admitting: Internal Medicine

## 2016-10-17 ENCOUNTER — Ambulatory Visit (INDEPENDENT_AMBULATORY_CARE_PROVIDER_SITE_OTHER): Payer: Medicare HMO | Admitting: Internal Medicine

## 2016-10-17 ENCOUNTER — Other Ambulatory Visit: Payer: Self-pay | Admitting: Family

## 2016-10-17 ENCOUNTER — Encounter: Payer: Self-pay | Admitting: Internal Medicine

## 2016-10-17 VITALS — BP 104/80 | HR 80 | Ht 73.0 in | Wt 229.2 lb

## 2016-10-17 DIAGNOSIS — L290 Pruritus ani: Secondary | ICD-10-CM

## 2016-10-17 DIAGNOSIS — Z8719 Personal history of other diseases of the digestive system: Secondary | ICD-10-CM

## 2016-10-17 DIAGNOSIS — R151 Fecal smearing: Secondary | ICD-10-CM | POA: Diagnosis not present

## 2016-10-17 DIAGNOSIS — F411 Generalized anxiety disorder: Secondary | ICD-10-CM

## 2016-10-17 DIAGNOSIS — K625 Hemorrhage of anus and rectum: Secondary | ICD-10-CM | POA: Diagnosis not present

## 2016-10-17 DIAGNOSIS — K219 Gastro-esophageal reflux disease without esophagitis: Secondary | ICD-10-CM

## 2016-10-17 MED ORDER — NA SULFATE-K SULFATE-MG SULF 17.5-3.13-1.6 GM/177ML PO SOLN: ORAL | 0 refills | Status: DC

## 2016-10-17 MED ORDER — PANTOPRAZOLE SODIUM 40 MG PO TBEC: 40.0000 mg | DELAYED_RELEASE_TABLET | Freq: Every day | ORAL | 3 refills | Status: DC

## 2016-10-17 NOTE — Progress Notes (Signed)
   Subjective:    Patient ID: Antonio Lawson, male    DOB: Jun 21, 1961, 56 y.o.   MRN: [id number]  HPI Antonio Lawson is a 56 year old male with a past medical history of GERD, colonic diverticulitis, fecal smearing and perianal itching who is here for follow-up. He also has a history of bipolar disorder and anxiety. He was seen initially on 06/07/2016 by Alonza Bogus, PA-C to discuss perianal itching and fecal leakage/smearing. She recommended a trial of fiber supplementation and also review of previous GI evaluation performed reportedly in [place] around 2010. We were not able to track down these records.  He returns today reporting he continues to have fecal seepage and smearing which he notices in his underwear. He also has frequent perianal itching and irritation. He occasionally sees blood with wiping and bowel movements. He is not having full incontinence of stool. He denies abdominal pain. Does not feel constipated. Denies diarrhea. Feels the colonoscopy was over 8 years ago in [place].  He was diagnosed with descending colon diverticulitis by CT scan last year. I reviewed this scan. He is currently not having abdominal pain. He does have frequent heartburn and indigestion. He was previously taking Zantac and Prevacid in the Nexium. Nexium has been difficult for him to afford but does help his symptoms. He denies dysphagia and odynophagia. He's had no weight loss or early satiety.   Review of Systems As per history of present illness, otherwise negative  Current Medications, Allergies, Past Medical History, Past Surgical History, Family History and Social History were reviewed in [employer] record.     Objective:   Physical Exam BP 104/80   Pulse 80   Ht 6\' 1"  (1.854 m)   Wt 229 lb 4 oz (104 kg)   BMI 30.25 kg/m  Constitutional: Well-developed and well-nourished. No distress. HEENT: anicteric, op clear CV: RRR, no mrg Pulm: CTA b/l Abd: soft, NT/ND,  +BS throughout Ext: no c/c/e Neuro: nonfocal     Assessment & Plan:  56 year old male with a past medical history of GERD, colonic diverticulitis, fecal smearing and perianal itching who is here for follow-up.  1. Perianal itching/fecal smearing/rectal bleeding -- entire constellation of symptoms could be attributed to internal hemorrhoids. Need to rule out other structural pathology and I have recommended repeat colonoscopy. We discussed the risk, benefits and alternatives and he wishes to proceed. If he indeed has internal hemorrhoids we consider banding versus surgical management after further evaluation. Other possibilities include pruritus ani and pelvic floor dysfunction. We can consider treatments for pruritus ani and consider anorectal manometry depending on findings at colonoscopy. He was given Resinol cream to apply twice daily for periodic discomfort while awaiting colonoscopy and further evaluation.  2. GERD/indigestion -- prescription for pantoprazole 40 mg once daily. Can use over-the-counter Zantac or Pepcid for breakthrough the evening if needed.  3.  History of diverticulitis -- uncomplicated and resolved with abx. We are proceeding to colonoscopy as discussed in #1.  4. Anxiety -- he requested a refill of alprazolam today. I deferred this to his primary care/prescribing provider.  25 minutes spent with the patient today. Greater than 50% was spent in counseling and coordination of care with the patient

## 2016-10-17 NOTE — Patient Instructions (Addendum)
You have been scheduled for a colonoscopy. Please follow written instructions given to you at your visit today.  Please pick up your prep supplies at the pharmacy within the next 1-3 days. If you use inhalers (even only as needed), please bring them with you on the day of your procedure. Your physician has requested that you go to www.startemmi.com and enter the access code given to you at your visit today. This web site gives a general overview about your procedure. However, you should still follow specific instructions given to you by our office regarding your preparation for the procedure.  We have sent the following medications to your pharmacy for you to pick up at your convenience: Pantoprazole 40 mg daily  Use Resinol on rectum as needed.  If you are age 54 or older, your body mass index should be between 23-30. Your Body mass index is 30.25 kg/m. If this is out of the aforementioned range listed, please consider follow up with your Primary Care Provider.  If you are age 42 or younger, your body mass index should be between 19-25. Your Body mass index is 30.25 kg/m. If this is out of the aformentioned range listed, please consider follow up with your Primary Care Provider.

## 2016-10-18 NOTE — Telephone Encounter (Signed)
Last refill was 07/05/16

## 2016-10-19 ENCOUNTER — Ambulatory Visit (AMBULATORY_SURGERY_CENTER): Payer: Medicare HMO | Admitting: Internal Medicine

## 2016-10-19 ENCOUNTER — Encounter: Payer: Self-pay | Admitting: Internal Medicine

## 2016-10-19 VITALS — BP 112/80 | HR 77 | Temp 97.3°F | Resp 19 | Ht 73.0 in | Wt 229.0 lb

## 2016-10-19 DIAGNOSIS — K635 Polyp of colon: Secondary | ICD-10-CM | POA: Diagnosis not present

## 2016-10-19 DIAGNOSIS — L290 Pruritus ani: Secondary | ICD-10-CM

## 2016-10-19 DIAGNOSIS — Z1211 Encounter for screening for malignant neoplasm of colon: Secondary | ICD-10-CM | POA: Diagnosis not present

## 2016-10-19 DIAGNOSIS — D123 Benign neoplasm of transverse colon: Secondary | ICD-10-CM | POA: Diagnosis not present

## 2016-10-19 DIAGNOSIS — Z8719 Personal history of other diseases of the digestive system: Secondary | ICD-10-CM

## 2016-10-19 DIAGNOSIS — D125 Benign neoplasm of sigmoid colon: Secondary | ICD-10-CM

## 2016-10-19 DIAGNOSIS — R151 Fecal smearing: Secondary | ICD-10-CM

## 2016-10-19 DIAGNOSIS — K625 Hemorrhage of anus and rectum: Secondary | ICD-10-CM

## 2016-10-19 MED ORDER — SODIUM CHLORIDE 0.9 % IV SOLN: 500.0000 mL | INTRAVENOUS | Status: DC

## 2016-10-19 NOTE — Patient Instructions (Signed)
Discharge instructions given. Handouts on polyps,diverticulosis,and hemorrhoids. Resume previous medications. YOU HAD AN ENDOSCOPIC PROCEDURE TODAY AT THE [facility]:   Refer to the procedure report that was given to you for any specific questions about what was found during the examination.  If the procedure report does not answer your questions, please call your gastroenterologist to clarify.  If you requested that your care partner not be given the details of your procedure findings, then the procedure report has been included in a sealed envelope for you to review at your convenience later.  YOU SHOULD EXPECT: Some feelings of bloating in the abdomen. Passage of more gas than usual.  Walking can help get rid of the air that was put into your GI tract during the procedure and reduce the bloating. If you had a lower endoscopy (such as a colonoscopy or flexible sigmoidoscopy) you may notice spotting of blood in your stool or on the toilet paper. If you underwent a bowel prep for your procedure, you may not have a normal bowel movement for a few days.  Please Note:  You might notice some irritation and congestion in your nose or some drainage.  This is from the oxygen used during your procedure.  There is no need for concern and it should clear up in a day or so.  SYMPTOMS TO REPORT IMMEDIATELY:   Following lower endoscopy (colonoscopy or flexible sigmoidoscopy):  Excessive amounts of blood in the stool  Significant tenderness or worsening of abdominal pains  Swelling of the abdomen that is new, acute  Fever of 100F or higher  For urgent or emergent issues, a gastroenterologist can be reached at any hour by calling [phone].   DIET:  We do recommend a small meal at first, but then you may proceed to your regular diet.  Drink plenty of fluids but you should avoid alcoholic beverages for 24 hours.  ACTIVITY:  You should plan to take it easy for the rest of today and you  should NOT DRIVE or use heavy machinery until tomorrow (because of the sedation medicines used during the test).    FOLLOW UP: Our staff will call the number listed on your records the next business day following your procedure to check on you and address any questions or concerns that you may have regarding the information given to you following your procedure. If we do not reach you, we will leave a message.  However, if you are feeling well and you are not experiencing any problems, there is no need to return our call.  We will assume that you have returned to your regular daily activities without incident.  If any biopsies were taken you will be contacted by phone or by letter within the next 1-3 weeks.  Please call us at [phone] if you have not heard about the biopsies in 3 weeks.    SIGNATURES/CONFIDENTIALITY: You and/or your care partner have signed paperwork which will be entered into your electronic medical record.  These signatures attest to the fact that that the information above on your After Visit Summary has been reviewed and is understood.  Full responsibility of the confidentiality of this discharge information lies with you and/or your care-partner. 

## 2016-10-19 NOTE — Op Note (Signed)
[facility] Patient Name: Antonio Lawson Procedure Date: 10/19/2016 7:59 AM MRN: [id number] Endoscopist: Jerene Bears , MD Age: 56 Referring MD:  Date of Birth: [birth year] Gender: Male Account #: [id number] Procedure:                Colonoscopy Indications:              Rectal bleeding, Follow-up of diverticulitis, anal                            itching, fecal smearing Medicines:                Monitored Anesthesia Care Procedure:                Pre-Anesthesia Assessment:                           - Prior to the procedure, a History and Physical                            was performed, and patient medications and                            allergies were reviewed. The patient's tolerance of                            previous anesthesia was also reviewed. The risks                            and benefits of the procedure and the sedation                            options and risks were discussed with the patient.                            All questions were answered, and informed consent                            was obtained. Prior Anticoagulants: The patient has                            taken no previous anticoagulant or antiplatelet                            agents. ASA Grade Assessment: II - A patient with                            mild systemic disease. After reviewing the risks                            and benefits, the patient was deemed in                            satisfactory condition to undergo the procedure.  After obtaining informed consent, the colonoscope                            was passed under direct vision. Throughout the                            procedure, the patient's blood pressure, pulse, and                            oxygen saturations were monitored continuously. The                            Model CF-HQ190L [id number]) scope was introduced                            through the anus and advanced to  the the cecum,                            identified by appendiceal orifice and ileocecal                            valve. The colonoscopy was performed without                            difficulty. The patient tolerated the procedure                            well. The quality of the bowel preparation was                            good. The ileocecal valve, appendiceal orifice, and                            rectum were photographed. Scope In: 8:13:23 AM Scope Out: 8:29:31 AM Scope Withdrawal Time: 0 hours 13 minutes 3 seconds  Total Procedure Duration: 0 hours 16 minutes 8 seconds  Findings:                 The digital rectal exam findings include                            non-thrombosed internal hemorrhoids. Pertinent                            negatives include normal sphincter tone.                           A 6 mm polyp with mucus cap was found in the                            hepatic flexure. The polyp was sessile. The polyp                            was removed with a cold snare. Resection and  retrieval were complete.                           Two sessile polyps were found in the sigmoid colon.                            The polyps were 3 to 4 mm in size. These polyps                            were removed with a cold snare. Resection and                            retrieval were complete.                           Multiple small and large-mouthed diverticula were                            found in the sigmoid colon and descending colon.                           Internal hemorrhoids were found during retroflexion                            and during digital exam. The hemorrhoids were small.                           The exam was otherwise without abnormality. Complications:            No immediate complications. Estimated Blood Loss:     Estimated blood loss was minimal. Impression:               - One 6 mm polyp at the hepatic flexure,  removed                            with a cold snare. Resected and retrieved.                           - Two 3 to 4 mm polyps in the sigmoid colon,                            removed with a cold snare. Resected and retrieved.                           - Moderate diverticulosis in the sigmoid colon and                            in the descending colon.                           - Internal hemorrhoids.                           - The examination was otherwise normal. Recommendation:           -  Patient has a contact number available for                            emergencies. The signs and symptoms of potential                            delayed complications were discussed with the                            patient. Return to normal activities tomorrow.                            Written discharge instructions were provided to the                            patient.                           - Resume previous diet.                           - Continue present medications.                           - Await pathology results.                           - Repeat colonoscopy is recommended. The                            colonoscopy date will be determined after pathology                            results from today's exam become available for                            review.                           - Return to GI clinic for hemorrhoidal banding x 3                            at appointments to be scheduled. Jerene Bears, MD 10/19/2016 8:35:48 AM This report has been signed electronically.

## 2016-10-19 NOTE — Progress Notes (Signed)
A and O x3. Report to RN. Tolerated MAC anesthesia well.

## 2016-10-19 NOTE — Telephone Encounter (Signed)
Script faxed to [facility]...Johny Chess

## 2016-10-19 NOTE — Progress Notes (Signed)
Called to room to assist during endoscopic procedure.  Patient ID and intended procedure confirmed with present staff. Received instructions for my participation in the procedure from the performing physician.  

## 2016-10-20 ENCOUNTER — Telehealth: Payer: Self-pay

## 2016-10-20 NOTE — Telephone Encounter (Signed)
Name identifier. Left a voicemail, we will call back later today.

## 2016-10-20 NOTE — Telephone Encounter (Signed)
  Follow up Call-  Call back number 10/19/2016  Post procedure Call Back phone  # [phone]  Permission to leave phone message Yes     Patient questions:  Do you have a fever, pain , or abdominal swelling? No. Pain Score  0 *  Have you tolerated food without any problems? Yes.    Have you been able to return to your normal activities? Yes.    Do you have any questions about your discharge instructions: Diet   No. Medications  No. Follow up visit  No.  Do you have questions or concerns about your Care? No.  Actions: * If pain score is 4 or above: No action needed, pain <4.

## 2016-10-24 ENCOUNTER — Encounter: Payer: Self-pay | Admitting: Internal Medicine

## 2016-11-01 DIAGNOSIS — F312 Bipolar disorder, current episode manic severe with psychotic features: Secondary | ICD-10-CM | POA: Diagnosis not present

## 2016-11-08 ENCOUNTER — Ambulatory Visit: Payer: Medicare HMO | Admitting: Sports Medicine

## 2016-11-08 ENCOUNTER — Telehealth: Payer: Self-pay | Admitting: Internal Medicine

## 2016-11-08 NOTE — Telephone Encounter (Signed)
Appt cancelled per pt request. 

## 2016-11-09 ENCOUNTER — Ambulatory Visit (INDEPENDENT_AMBULATORY_CARE_PROVIDER_SITE_OTHER): Payer: Medicare HMO

## 2016-11-09 ENCOUNTER — Ambulatory Visit (INDEPENDENT_AMBULATORY_CARE_PROVIDER_SITE_OTHER): Payer: Medicare HMO | Admitting: Sports Medicine

## 2016-11-09 ENCOUNTER — Encounter: Payer: Self-pay | Admitting: Sports Medicine

## 2016-11-09 VITALS — BP 110/82 | HR 72 | Ht 73.0 in | Wt 234.4 lb

## 2016-11-09 DIAGNOSIS — M205X2 Other deformities of toe(s) (acquired), left foot: Secondary | ICD-10-CM

## 2016-11-09 DIAGNOSIS — M19072 Primary osteoarthritis, left ankle and foot: Secondary | ICD-10-CM | POA: Diagnosis not present

## 2016-11-09 DIAGNOSIS — M79672 Pain in left foot: Secondary | ICD-10-CM

## 2016-11-09 DIAGNOSIS — M545 Low back pain: Secondary | ICD-10-CM | POA: Diagnosis not present

## 2016-11-09 DIAGNOSIS — M4727 Other spondylosis with radiculopathy, lumbosacral region: Secondary | ICD-10-CM

## 2016-11-09 NOTE — Patient Instructions (Signed)
We will be setting up an injection for you with Dr. Ernestina Patches at [facility].

## 2016-11-09 NOTE — Progress Notes (Signed)
Antonio Lawson - 56 y.o. male MRN [id number]  Date of birth: [birth year]  Office Visit Note: Visit Date: 11/09/2016 PCP: Hoyt Koch, MD Referred by: Hoyt Koch, *  Subjective: Chief Complaint  Patient presents with  . pain in toe  . pain in knee   HPI: Patient presents with chronic ongoing left great toe and first webspace pain that radiates to the knee as well as to the buttock on occasion.  This seems to be worse with walking or standing.  He denies any significant injury.  He is concerned that it may be gout but has had only chronic stable symptoms over the past several years.  Anti-inflammatories are only minimally helpful. ROS: Pt denies any change in bowel or bladder habits, muscle weakness, or falls associated with this pain.   Otherwise per HPI.  Objective:  VS:  HT:6\' 1"  (185.4 cm)   WT:234 lb 6.4 oz (106.3 kg)  BMI:31    BP:110/82  HR:72bpm  TEMP: ( )  RESP:94 % Physical Exam: GENERAL:  WDWN, NAD, Non-toxic appearing PSYCH:  Alert & appropriately interactive  Not depressed or anxious appearing.    Circumferential thought process with some perseverating on gout LOWER EXTREMITIES:  No significant rashes/lesions/ulcerations overlying the legs.  No significant pretibial edema.  No clubbing or cyanosis.  DP & PT pulses trace but palpable.  Capillary refill is 3-4 seconds on the left compared to the right.    Sensation intact to light touch. BACK:   Tight straight leg raise bilaterally left worse than right  No significant midline tenderness.  TTP over left sacral base  Good internal and external rotation of the hips.  Manual muscle testing is 5+/5 in BLE myotomes without focality  Lower extremity DTRs 2+/4 diffusely and symmetric FOOT & ANKLE:   Overall foot and ankle are well aligned, no significant deformity.   No significant TTP over the base of the 5th metatarsal, navicular, or posterior aspect of medial or lateral  malleolus  TTP over the first MTP without focality or significant overlying skin changes.  Moderate tenderness with movement of the great toe.  Mild osteoarthritic bossing.  He has hallux limitus  Inversion, eversion, dorsiflexion and plantar flexion strength 5+/5.  Stable to ankle drawer and talar tilt.  Negative cotton test   Imaging & Procedures: Dg Lumbar Spine 2-3 Views  Result Date: 11/09/2016 CLINICAL DATA:  Left low back pain with radiation to the left hip and lower leg EXAM: LUMBAR SPINE - 2-3 VIEW COMPARISON:  CT abdomen pelvis of 08/17/2015 FINDINGS: The lumbar vertebrae are in normal alignment with normal intervertebral disc spaces. No compression deformity is seen. No pars defect is noted. The SI joints are corticated. There is feces throughout the colon. There is a small opacity overlying the distal transverse process of L4. This may well be bony in origin, but an overlapping ureteral calculus cannot be excluded in the proper clinical setting. IMPRESSION: 1. Normal alignment of the lumbar vertebrae with normal intervertebral disc spaces. 2. Probable bony density overlying the transverse process on the left at L4 but cannot exclude an overlapping left ureteral calculus. Correlate clinically. Electronically Signed   By: Ivar Drape M.D.   On: 11/09/2016 11:22   Dg Foot 2 Views Left  Result Date: 11/09/2016 CLINICAL DATA:  Left great toe pain.  No injury. EXAM: LEFT FOOT - 2 VIEW COMPARISON:  None. FINDINGS: Osteoarthritic changes at the left first MTP joint with joint space narrowing and spurring. No  acute bony abnormality. Specifically, no fracture, subluxation, or dislocation. Soft tissues are intact. IMPRESSION: Mild osteoarthritis at left first MTP joint. No acute bony abnormality. Electronically Signed   By: Rolm Baptise M.D.   On: 11/09/2016 11:20   Assessment & Plan: Problem List Items Addressed This Visit    Osteoarthritis of spine with radiculopathy, lumbosacral region     Symptoms are consistent with both hallux limitus but more focally with a likely L5 radiculitis.  I would like to send him to Dr. Ernestina Patches for both diagnostic and therapeutic epidural steroid injection at L5 on the left.  Open follow-up with him after this is obtained at 4 weeks and consider advancement of therapeutic exercises.  If any acute flareups will consider further evaluation of his great toe but this may end up requiring cushioned orthotics.      Relevant Orders   Ambulatory referral to Physical Medicine Rehab   Hallux limitus of left foot    Other Visit Diagnoses    Left foot pain    -  Primary   Relevant Orders   DG Foot 2 Views Left (Completed)   DG Lumbar Spine 2-3 Views (Completed)   Ambulatory referral to Physical Medicine Rehab      Follow-up: Return in about 6 weeks (around 12/21/2016) for repeat clinical exam.   Past Medical/Family/Surgical/Social History: Medications & Allergies reviewed per EMR Patient Active Problem List   Diagnosis Date Noted  . Hallux limitus of left foot 11/10/2016  . Osteoarthritis of spine with radiculopathy, lumbosacral region 11/09/2016  . Perianal irritation 06/07/2016  . Incontinence of feces 06/07/2016  . Arthralgia 06/01/2016  . Anxiety state 06/01/2016  . Hemorrhoids 03/28/2016  . GERD (gastroesophageal reflux disease) 03/28/2016  . Anal discharge 03/28/2016  . Sleep disturbance 03/28/2016  . Bipolar I disorder ([place]) 03/28/2016  . Overweight (BMI 25.0-29.9) 03/28/2016  . Tobacco use disorder 03/28/2016   Past Medical History:  Diagnosis Date  . Bipolar 1 disorder ([place])   . Chicken pox   . Diverticulitis   . Diverticulosis   . Fatty liver   . GERD (gastroesophageal reflux disease)    Family History  Problem Relation Age of Onset  . Pancreatic cancer Mother   . Heart attack Father   . Stroke Father   . Esophageal cancer Maternal Grandfather    Past Surgical History:  Procedure Laterality Date  . APPENDECTOMY    .  HERNIA REPAIR     Social History   Occupational History  . Disability     Personel   Social History Main Topics  . Smoking status: Current Every Day Smoker    Packs/day: 1.00    Types: Cigarettes  . Smokeless tobacco: Never Used  . Alcohol use No  . Drug use: No  . Sexual activity: Not on file

## 2016-11-10 ENCOUNTER — Telehealth: Payer: Self-pay | Admitting: Sports Medicine

## 2016-11-10 ENCOUNTER — Other Ambulatory Visit: Payer: Self-pay

## 2016-11-10 DIAGNOSIS — M205X2 Other deformities of toe(s) (acquired), left foot: Secondary | ICD-10-CM | POA: Insufficient documentation

## 2016-11-10 MED ORDER — METHYLPREDNISOLONE 4 MG PO TBPK: ORAL_TABLET | ORAL | 0 refills | Status: DC

## 2016-11-10 MED ORDER — GABAPENTIN 300 MG PO CAPS: ORAL_CAPSULE | ORAL | 1 refills | Status: DC

## 2016-11-10 NOTE — Telephone Encounter (Signed)
The patient is calling back about pain medications. His pharmacy is below.  [address], [place], [place] [address]  I let him know that the gabapentin is pending.  Thank you,  -LL

## 2016-11-10 NOTE — Assessment & Plan Note (Signed)
Symptoms are consistent with both hallux limitus but more focally with a likely L5 radiculitis.  I would like to send him to Dr. Ernestina Patches for both diagnostic and therapeutic epidural steroid injection at L5 on the left.  Open follow-up with him after this is obtained at 4 weeks and consider advancement of therapeutic exercises.  If any acute flareups will consider further evaluation of his great toe but this may end up requiring cushioned orthotics.

## 2016-11-10 NOTE — Telephone Encounter (Signed)
Pt calling because he is having a lot of pain in his buttock and toes. He is going to get a Cortisone injection on the 16 th but he says that isn't going to help the pain now. He wants to know if there is anything Dr. Paulla Fore can give him for the pain. He hasn't tried anything in the past that has helped. He tried Tramadol in the past and got no relief.He says that his pain is over 10 on pain scale. He is also having pain in the left side of his head. He says the pain is so bad he didn't even want to get up. He also wants to know how the cortisone injection is going to help him and if its sciatica, if its going to make all the pain go away. If it is going to help him he wants to know why it cannot be done sooner. He says either way he needs something to help give him some relief in the meantime.  [phone])

## 2016-11-10 NOTE — Telephone Encounter (Signed)
Will forward request to Dr. Paulla Fore.

## 2016-11-10 NOTE — Telephone Encounter (Signed)
Prescription for gabapentin pended.  Please find out which pharmacy he uses and send it into there.  This should help with the pain that he is experiencing.

## 2016-11-10 NOTE — Telephone Encounter (Signed)
Patient is calling to voice concern about gabapentin. He is afraid of side effects of the medication. He is asking if you can RX tylenol 3 or percocet because he knows those medications. Verified work # is best.

## 2016-11-12 NOTE — Telephone Encounter (Signed)
Extensive conversation with the patient on 11/10/2016 regarding appropriateness of medications.  Discussed that narcotic therapy is not indicated at this time but we did have the option for the addition of steroid Dosepak if he is that immensely uncomfortable.  He did continue to perseverate on gout.  Ultimately a prescription for both gabapentin and Medrol Dosepak were sent to his pharmacy and are available for him if he desires.  We did discuss the possibility of potential induction of mania with systemic steroids since the reason we are trying to avoid dazing using gabapentin ovoids.  Ultimately he will most likely benefit ESI that has been set up for next week.

## 2016-11-13 ENCOUNTER — Telehealth: Payer: Self-pay

## 2016-11-13 NOTE — Telephone Encounter (Signed)
Multiple attempts were made to contact the patient including calling the number provided as well as his home phone.  The home phone number was disconnected and the [phone] number was not in service.  Multiple attempts were made.  This will need to be followed up tomorrow when the office reopens.  Narcotic medications are not indicated at this time given the nonspecific nature of his complaints and likely radicular component.

## 2016-11-13 NOTE — Telephone Encounter (Signed)
After speaking with pt he c/o worsening foot and back pain. Sx are keeping him up at night. He took Gabapentin 300mg  1qd x3 and took all of 4 tablets of the Medrol Dosepack with no relief. He is requesting stronger pain medication at this time. He would like a call back on

## 2016-11-13 NOTE — Telephone Encounter (Signed)
Pt would like a call back on [phone].

## 2016-11-14 NOTE — Telephone Encounter (Signed)
Called and spoke with the patient today.  He has been preapproved in the orders been placed at [facility] to schedule.  Telephone number in the computer system has been corrected.  Patient was confused and thought that his injection was going to be on April 16 but this is his follow-up appointment with me.  Patient would like the epidural scheduled as soon as possible.

## 2016-11-20 ENCOUNTER — Encounter: Payer: Self-pay | Admitting: *Deleted

## 2016-11-28 ENCOUNTER — Ambulatory Visit (INDEPENDENT_AMBULATORY_CARE_PROVIDER_SITE_OTHER): Payer: Medicare HMO | Admitting: Physical Medicine and Rehabilitation

## 2016-11-28 ENCOUNTER — Encounter (INDEPENDENT_AMBULATORY_CARE_PROVIDER_SITE_OTHER): Payer: Self-pay | Admitting: Physical Medicine and Rehabilitation

## 2016-11-28 ENCOUNTER — Ambulatory Visit (INDEPENDENT_AMBULATORY_CARE_PROVIDER_SITE_OTHER): Payer: Self-pay

## 2016-11-28 VITALS — BP 141/90 | HR 69

## 2016-11-28 DIAGNOSIS — M5416 Radiculopathy, lumbar region: Secondary | ICD-10-CM | POA: Diagnosis not present

## 2016-11-28 MED ORDER — LIDOCAINE HCL (PF) 1 % IJ SOLN: 0.3300 mL | Freq: Once | INTRAMUSCULAR | Status: DC

## 2016-11-28 MED ORDER — METHYLPREDNISOLONE ACETATE 80 MG/ML IJ SUSP: 80.0000 mg | Freq: Once | INTRAMUSCULAR | Status: DC

## 2016-11-28 NOTE — Progress Notes (Signed)
Antonio Lawson - 56 y.o. male MRN [id number]  Date of birth: 1961/01/30  Office Visit Note: Visit Date: 11/28/2016 PCP: Hoyt Koch, MD Referred by: Hoyt Koch, *  Subjective: Chief Complaint  Patient presents with  . Lower Back - Pain   HPI: Antonio Lawson is a 56 year gentleman kindly sent here by Dr. Paulla Fore is been managing his care. Dr. Paulla Fore requested of him. L5-S1 epidural injection diagnostically and hopefully therapeutically. The patient reports that his pain started with pain in right big toe around a year ago without injury. Gout was ruled out.  Then pain started shooting up leg and radiates to hip. Worse with walking. Denies burning, numbness, or tingling.     ROS Otherwise per HPI.  Assessment & Plan: Visit Diagnoses:  1. Lumbar radiculopathy     Plan: Findings:  Right L5-S1 intralaminar epidural steroid injection.    Meds & Orders:  Meds ordered this encounter  Medications  . lidocaine (PF) (XYLOCAINE) 1 % injection 0.3 mL  . methylPREDNISolone acetate (DEPO-MEDROL) injection 80 mg    Orders Placed This Encounter  Procedures  . XR C-ARM NO REPORT  . Epidural Steroid injection    Follow-up: Return for Dr. Paulla Fore as scheduled.   Procedures: No procedures performed  Lumbar Epidural Steroid Injection - Interlaminar Approach with Fluoroscopic Guidance  Patient: Antonio Lawson      Date of Birth: [birth year] MRN: [id number] PCP: Hoyt Koch, MD      Visit Date: 11/28/2016   Universal Protocol:    Date/Time: 03/29/185:58 AM  Consent Given By: the patient  Position: PRONE  Additional Comments: Vital signs were monitored before and after the procedure. Patient was prepped and draped in the usual sterile fashion. The correct patient, procedure, and site was verified.   Injection Procedure Details:  Procedure Site One Meds Administered:  Meds ordered this encounter  Medications  . lidocaine (PF) (XYLOCAINE) 1 % injection  0.3 mL  . methylPREDNISolone acetate (DEPO-MEDROL) injection 80 mg     Laterality: Right  Location/Site:  L5-S1  Needle size: 20 G  Needle type: Tuohy  Needle Placement: Paramedian epidural  Findings:  -Contrast Used: 1 mL iohexol 180 mg iodine/mL   -Comments: Excellent flow of contrast into the epidural space.  Procedure Details: Using a paramedian approach from the side mentioned above, the region overlying the inferior lamina was localized under fluoroscopic visualization and the soft tissues overlying this structure were infiltrated with 4 ml. of 1% Lidocaine without Epinephrine. The Tuohy needle was inserted into the epidural space using a paramedian approach.   The epidural space was localized using loss of resistance along with lateral and bi-planar fluoroscopic views.  After negative aspirate for air, blood, and CSF, a 2 ml. volume of Isovue-250 was injected into the epidural space and the flow of contrast was observed. Radiographs were obtained for documentation purposes.    The injectate was administered into the level noted above.   Additional Comments:  The patient tolerated the procedure well Dressing: Band-Aid    Post-procedure details: Patient was observed during the procedure. Post-procedure instructions were reviewed.  Patient left the clinic in stable condition.    Clinical History: No specialty comments available.  He reports that he has been smoking Cigarettes.  He has been smoking about 1.00 pack per day. He has never used smokeless tobacco. No results for input(s): HGBA1C, LABURIC in the last 8760 hours.  Objective:  VS:  HT:    WT:  BMI:     BP:(!) 141/90  HR:69bpm  TEMP: ( )  RESP:93 % Physical Exam  Musculoskeletal:  Patient has a negative slump test bilaterally. He is fairly tight hamstrings bilaterally. He has no clonus bilaterally. He does have decreased sensation to light touch at least subjectively in a right L5 dermatome. He has good  pulses bilaterally without edema.    Ortho Exam Imaging: No results found.  Past Medical/Family/Surgical/Social History: Medications & Allergies reviewed per EMR Patient Active Problem List   Diagnosis Date Noted  . Hallux limitus of left foot 11/10/2016  . Osteoarthritis of spine with radiculopathy, lumbosacral region 11/09/2016  . Perianal irritation 06/07/2016  . Incontinence of feces 06/07/2016  . Arthralgia 06/01/2016  . Anxiety state 06/01/2016  . Hemorrhoids 03/28/2016  . GERD (gastroesophageal reflux disease) 03/28/2016  . Anal discharge 03/28/2016  . Sleep disturbance 03/28/2016  . Bipolar I disorder ([place]) 03/28/2016  . Overweight (BMI 25.0-29.9) 03/28/2016  . Tobacco use disorder 03/28/2016   Past Medical History:  Diagnosis Date  . Bipolar 1 disorder ([place])   . Chicken pox   . Diverticulitis   . Diverticulosis   . Fatty liver   . GERD (gastroesophageal reflux disease)   . Hyperplastic colon polyp   . Internal hemorrhoids    Family History  Problem Relation Age of Onset  . Pancreatic cancer Mother   . Heart attack Father   . Stroke Father   . Esophageal cancer Maternal Grandfather    Past Surgical History:  Procedure Laterality Date  . APPENDECTOMY    . HERNIA REPAIR     Social History   Occupational History  . Disability     Personel   Social History Main Topics  . Smoking status: Current Every Day Smoker    Packs/day: 1.00    Types: Cigarettes  . Smokeless tobacco: Never Used  . Alcohol use No  . Drug use: No  . Sexual activity: Not on file

## 2016-11-28 NOTE — Patient Instructions (Signed)

## 2016-11-30 NOTE — Procedures (Signed)
Lumbar Epidural Steroid Injection - Interlaminar Approach with Fluoroscopic Guidance  Patient: Antonio Lawson      Date of Birth: 1961/03/25 MRN: [id number] PCP: Hoyt Koch, MD      Visit Date: 11/28/2016   Universal Protocol:    Date/Time: 03/29/185:58 AM  Consent Given By: the patient  Position: PRONE  Additional Comments: Vital signs were monitored before and after the procedure. Patient was prepped and draped in the usual sterile fashion. The correct patient, procedure, and site was verified.   Injection Procedure Details:  Procedure Site One Meds Administered:  Meds ordered this encounter  Medications  . lidocaine (PF) (XYLOCAINE) 1 % injection 0.3 mL  . methylPREDNISolone acetate (DEPO-MEDROL) injection 80 mg     Laterality: Right  Location/Site:  L5-S1  Needle size: 20 G  Needle type: Tuohy  Needle Placement: Paramedian epidural  Findings:  -Contrast Used: 1 mL iohexol 180 mg iodine/mL   -Comments: Excellent flow of contrast into the epidural space.  Procedure Details: Using a paramedian approach from the side mentioned above, the region overlying the inferior lamina was localized under fluoroscopic visualization and the soft tissues overlying this structure were infiltrated with 4 ml. of 1% Lidocaine without Epinephrine. The Tuohy needle was inserted into the epidural space using a paramedian approach.   The epidural space was localized using loss of resistance along with lateral and bi-planar fluoroscopic views.  After negative aspirate for air, blood, and CSF, a 2 ml. volume of Isovue-250 was injected into the epidural space and the flow of contrast was observed. Radiographs were obtained for documentation purposes.    The injectate was administered into the level noted above.   Additional Comments:  The patient tolerated the procedure well Dressing: Band-Aid    Post-procedure details: Patient was observed during the  procedure. Post-procedure instructions were reviewed.  Patient left the clinic in stable condition.

## 2016-12-05 ENCOUNTER — Ambulatory Visit (INDEPENDENT_AMBULATORY_CARE_PROVIDER_SITE_OTHER): Payer: Medicare HMO | Admitting: Internal Medicine

## 2016-12-05 ENCOUNTER — Encounter: Payer: Self-pay | Admitting: Internal Medicine

## 2016-12-05 VITALS — BP 124/78 | HR 64 | Ht 73.0 in | Wt 229.0 lb

## 2016-12-05 DIAGNOSIS — K641 Second degree hemorrhoids: Secondary | ICD-10-CM | POA: Diagnosis not present

## 2016-12-05 DIAGNOSIS — R151 Fecal smearing: Secondary | ICD-10-CM | POA: Diagnosis not present

## 2016-12-05 DIAGNOSIS — L290 Pruritus ani: Secondary | ICD-10-CM

## 2016-12-05 NOTE — Progress Notes (Signed)
Antonio Lawson is a 56 year old male with long-standing history of fecal smearing and perianal itching who is here for consideration of hemorrhoidal banding for symptomatic internal hemorrhoids Recent colonoscopy completed on 10/19/2016 He had a 6 mm hepatic flexure polyp with mucus Removed found to be hyperplastic. I'm suspicious for sessile serrated polyp and for this reason repeating screening/surveillance in 5 years. 2 hyperplastic polyps were removed from the sigmoid. These were 3-4 mm in size. There are multiple diverticuli in the left colon and internal hemorrhoids seen on retroflexion He has not had bleeding or prolapse symptoms Again his internal hemorrhoid symptoms are fecal smearing and perianal itching    PROCEDURE NOTE:  The patient presents with symptomatic grade 2 internal hemorrhoids, requesting rubber band ligation of his hemorrhoidal disease.  All risks, benefits and alternative forms of therapy were described and informed consent was obtained.   The anorectum was pre-medicated with 0.125% nitroglycerin ointment The decision was made to band the LL internal hemorrhoid, and the [place] was used to perform band ligation without complication.   Digital anorectal examination was then performed to assure proper positioning of the band, and to adjust the banded tissue as required.  The patient was discharged home without pain or other issues.  Dietary and behavioral recommendations were given and along with follow-up instructions.      The patient will return as scheduled for  follow-up and possible additional banding as required. No complications were encountered and the patient tolerated the procedure well.

## 2016-12-05 NOTE — Patient Instructions (Signed)
If you are age 54 or older, your body mass index should be between 23-30. Your Body mass index is 30.21 kg/m. If this is out of the aforementioned range listed, please consider follow up with your Primary Care Provider.  If you are age 61 or younger, your body mass index should be between 19-25. Your Body mass index is 30.21 kg/m. If this is out of the aformentioned range listed, please consider follow up with your Primary Care Provider.   HEMORRHOID BANDING PROCEDURE    FOLLOW-UP CARE   1. The procedure you have had should have been relatively painless since the banding of the area involved does not have nerve endings and there is no pain sensation.  The rubber band cuts off the blood supply to the hemorrhoid and the band may fall off as soon as 48 hours after the banding (the band may occasionally be seen in the toilet bowl following a bowel movement). You may notice a temporary feeling of fullness in the rectum which should respond adequately to plain Tylenol or Motrin.  2. Following the banding, avoid strenuous exercise that evening and resume full activity the next day.  A sitz bath (soaking in a warm tub) or bidet is soothing, and can be useful for cleansing the area after bowel movements.     3. To avoid constipation, take two tablespoons of natural wheat bran, natural oat bran, flax, Benefiber or any over the counter fiber supplement and increase your water intake to 7-8 glasses daily.    4. Unless you have been prescribed anorectal medication, do not put anything inside your rectum for two weeks: No suppositories, enemas, fingers, etc.  5. Occasionally, you may have more bleeding than usual after the banding procedure.  This is often from the untreated hemorrhoids rather than the treated one.  Don't be concerned if there is a tablespoon or so of blood.  If there is more blood than this, lie flat with your bottom higher than your head and apply an ice pack to the area. If the bleeding  does not stop within a half an hour or if you feel faint, call our office at [phone] or go to the emergency room.  6. Problems are not common; however, if there is a substantial amount of bleeding, severe pain, chills, fever or difficulty passing urine (very rare) or other problems, you should call us at [phone] or report to the nearest emergency room.  7. Do not stay seated continuously for more than 2-3 hours for a day or two after the procedure.  Tighten your buttock muscles 10-15 times every two hours and take 10-15 deep breaths every 1-2 hours.  Do not spend more than a few minutes on the toilet if you cannot empty your bowel; instead re-visit the toilet at a later time.    Thank you for choosing me and [facility].  Zenovia Jarred, MD

## 2016-12-18 ENCOUNTER — Telehealth: Payer: Self-pay | Admitting: Sports Medicine

## 2016-12-18 ENCOUNTER — Encounter: Payer: Self-pay | Admitting: Sports Medicine

## 2016-12-18 ENCOUNTER — Ambulatory Visit (INDEPENDENT_AMBULATORY_CARE_PROVIDER_SITE_OTHER): Payer: Medicare HMO | Admitting: Sports Medicine

## 2016-12-18 VITALS — BP 106/72 | HR 71 | Ht 73.0 in | Wt 229.8 lb

## 2016-12-18 DIAGNOSIS — M79672 Pain in left foot: Secondary | ICD-10-CM | POA: Diagnosis not present

## 2016-12-18 DIAGNOSIS — F17200 Nicotine dependence, unspecified, uncomplicated: Secondary | ICD-10-CM | POA: Diagnosis not present

## 2016-12-18 DIAGNOSIS — K219 Gastro-esophageal reflux disease without esophagitis: Secondary | ICD-10-CM | POA: Diagnosis not present

## 2016-12-18 DIAGNOSIS — M4727 Other spondylosis with radiculopathy, lumbosacral region: Secondary | ICD-10-CM

## 2016-12-18 DIAGNOSIS — M205X2 Other deformities of toe(s) (acquired), left foot: Secondary | ICD-10-CM | POA: Diagnosis not present

## 2016-12-18 MED ORDER — DICLOFENAC SODIUM 1 % TD GEL: TRANSDERMAL | 1 refills | Status: DC

## 2016-12-18 NOTE — Assessment & Plan Note (Signed)
mild lumbar OA.  Discussed the option to obtain an MRI but he would like to pursue formal physical therapy initially.

## 2016-12-18 NOTE — Assessment & Plan Note (Signed)
Topical NSAIDs prescribed.  Would like to try to avoid systemic treatment and he has been hesitant to take steroids.  Could consider Pepcid plus naproxen if persistent symptoms.

## 2016-12-18 NOTE — Progress Notes (Signed)
OFFICE VISIT NOTE Antonio Lawson. [facility], [facility] at [facility]  Jeniel Slauson - 56 y.o. male MRN [id number]  Date of birth: [birth year]  Visit Date: 12/18/2016  PCP: Hoyt Koch, MD   Referred by: Hoyt Koch, *  SUBJECTIVE:   Chief Complaint  Patient presents with  . left great toe pain    Pt c/o continued left great toe pain. He says that the injection did not help at this last visit. He was having trouble moving his toes after the injection. The pain is chronic and stable. He has tried Ibuprofen with little relief.    HPI: As above. Additional pertinent information includes:  Persistent ongoing pain associated mainly with the toe but with the left buttock, lateral thigh, anterior knee and anterior shin.  Pain is rated as moderate to severe.  Pain is worse with dorsiflexion of the great toe.  The injection did not provide any significant meaningful relief and reports actually feeling as though things worsen significantly with curling of the second and third toes following the injection that resolved within 2 days.  He does have a hard time articulating his complaints.   ROS: Review of Systems  Constitutional: Negative for chills, fever, malaise/fatigue and weight loss.  Cardiovascular: Negative for chest pain, leg swelling and PND.  Gastrointestinal: Positive for abdominal pain (Followed by GI).  Skin: Negative for rash.  Neurological: Negative for dizziness, tingling, sensory change, focal weakness, weakness and headaches.  Endo/Heme/Allergies: Does not bruise/bleed easily.    Otherwise per HPI.  HISTORY & PERTINENT PRIOR DATA:  No specialty comments available. He reports that he has been smoking Cigarettes.  He has been smoking about 1.00 pack per day. He has never used smokeless tobacco. No results for input(s): HGBA1C, LABURIC in the last 8760 hours. Medications & Allergies reviewed per EMR Patient  Active Problem List   Diagnosis Date Noted  . Left foot pain 12/18/2016  . Hallux limitus of left foot 11/10/2016  . Osteoarthritis of spine with radiculopathy, lumbosacral region 11/09/2016  . Perianal irritation 06/07/2016  . Incontinence of feces 06/07/2016  . Arthralgia 06/01/2016  . Anxiety state 06/01/2016  . Hemorrhoids 03/28/2016  . GERD (gastroesophageal reflux disease) 03/28/2016  . Anal discharge 03/28/2016  . Sleep disturbance 03/28/2016  . Bipolar I disorder ([place]) 03/28/2016  . Overweight (BMI 25.0-29.9) 03/28/2016  . Tobacco use disorder 03/28/2016   Past Medical History:  Diagnosis Date  . Bipolar 1 disorder ([place])   . Chicken pox   . Diverticulitis   . Diverticulosis   . Fatty liver   . GERD (gastroesophageal reflux disease)   . Hyperplastic colon polyp   . Internal hemorrhoids    Family History  Problem Relation Age of Onset  . Pancreatic cancer Mother   . Heart attack Father   . Stroke Father   . Esophageal cancer Maternal Grandfather    Past Surgical History:  Procedure Laterality Date  . APPENDECTOMY    . HERNIA REPAIR     Social History   Occupational History  . Disability     Personel   Social History Main Topics  . Smoking status: Current Every Day Smoker    Packs/day: 1.00    Types: Cigarettes  . Smokeless tobacco: Never Used  . Alcohol use No  . Drug use: No  . Sexual activity: Not on file    OBJECTIVE:  VS:  HT:6\' 1"  (185.4 cm)   WT:229 lb  12.8 oz (104.2 kg)  BMI:30.4    BP:106/72  HR:71bpm  TEMP: ( )  RESP:96 % Physical Exam  Constitutional: He appears well-developed and well-nourished. He is cooperative.  Non-toxic appearance.  HENT:  Head: Normocephalic and atraumatic.  Cardiovascular: Intact distal pulses.   Pulmonary/Chest: No accessory muscle usage. No respiratory distress.  Neurological: He is alert. He is not disoriented. He displays normal reflexes. No sensory deficit.  Skin: Skin is warm, dry and intact.  Capillary refill takes less than 2 seconds. No abrasion and no rash noted.  Psychiatric: His mood appears anxious. His affect is blunt. His speech is rapid and/or pressured.  Tangential thought process   Left foot and low back: Mild hallux limitus with reproducible pain with terminal dorsiflexion.  Intrinsic foot motion is intact.  Four-way ankle strength is 5+ out of 5.  No significant pain with straight leg raise or slump test.  Diminished sensation in the L5 distribution subjectively.  No significant knee effusion.  Knee is stable to varus and valgus strain.  Negative McMurray's.   IMAGING & PROCEDURES: Xr C-arm No Report  Result Date: 11/28/2016 Please see Notes or Procedures tab for imaging impression.  No additional findings.   ASSESSMENT & PLAN:  Visit Diagnoses:  1. Left foot pain   2. Osteoarthritis of spine with radiculopathy, lumbosacral region   3. Hallux limitus of left foot   4. Gastroesophageal reflux disease, esophagitis presence not specified   5. Tobacco use disorder    Meds:  Meds ordered this encounter  Medications  . diclofenac sodium (VOLTAREN) 1 % GEL    Sig: Apply topically to affected area qid    Dispense:  100 g    Refill:  1    Orders:  Orders Placed This Encounter  Procedures  . Ambulatory referral to Physical Therapy    Follow-up: Return in about 4 weeks (around 01/15/2017).   Otherwise please see problem oriented charting as below.

## 2016-12-18 NOTE — Assessment & Plan Note (Signed)
Mild OA.  Patient has difficult time fully elucidating his major complaint.  The great toe pain that is worse with full dorsiflexion is likely related to an intrinsic foot pathology and Voltaren gel has been recommended for him.  There may be a underlying component of radiculitis as well and I suspect this is the bigger complaint especially with his day-to-day discomfort and difficulty with buttock knee and leg pain.  Referral to physical therapy placed.

## 2016-12-18 NOTE — Telephone Encounter (Signed)
Patient called to advise that he went to [facility] to pick up his rx he was told to get however, it is too expensive. Patient requests another medication that is not as expensive. Patient is not sure if he will be able to get medication today due to schedule. Patient wanted to speak to [employer] about his concern.   I spoke with Theadora Rama, CMA and she stated she would check with Dr. Paulla Fore and call and advise patient.   Patient was made aware and is awaiting a call back from [place].

## 2016-12-18 NOTE — Assessment & Plan Note (Signed)
Symptoms are consistent still with an L5 radiculitis given the left hip and left knee and left great toe pain. We will refer to physical therapy as well as prescription for Voltaren gel to be used as needed for the knee and the toe.  If any lack of improvement will consider MRI of the lumbar spine.  If persistent or worsening mechanical symptoms of the left knee will consider further diagnostic evaluation.

## 2016-12-18 NOTE — Telephone Encounter (Signed)
Forwarding to Dr. Rigby to advise.  

## 2016-12-19 ENCOUNTER — Ambulatory Visit (INDEPENDENT_AMBULATORY_CARE_PROVIDER_SITE_OTHER): Payer: Medicare HMO | Admitting: Internal Medicine

## 2016-12-19 ENCOUNTER — Encounter: Payer: Self-pay | Admitting: Internal Medicine

## 2016-12-19 VITALS — BP 146/72 | HR 86 | Ht 73.0 in | Wt 229.0 lb

## 2016-12-19 DIAGNOSIS — R151 Fecal smearing: Secondary | ICD-10-CM

## 2016-12-19 DIAGNOSIS — K648 Other hemorrhoids: Secondary | ICD-10-CM

## 2016-12-19 NOTE — Telephone Encounter (Signed)
Called and LM to have pt contact the office.

## 2016-12-19 NOTE — Progress Notes (Signed)
Ferdinand returns today for hemorrhoidal banding #2 Long-standing history of internal hemorrhoids with fecal smearing and perianal itching Initial banding on 12/05/2016 to the left lateral internal hemorrhoid Some mild improvement in fecal smearing but symptoms largely persist   PROCEDURE NOTE:  The patient presents with symptomatic grade 2 internal hemorrhoids, requesting rubber band ligation of his hemorrhoidal disease.  All risks, benefits and alternative forms of therapy were described and informed consent was obtained.   The anorectum was pre-medicated with 0.125% nitroglycerin ointment The decision was made to band the RA (LL on band 1) internal hemorrhoid, and the [place] was used to perform band ligation without complication.   Digital anorectal examination was then performed to assure proper positioning of the band, and to adjust the banded tissue as required.  The patient was discharged home without pain or other issues.  Dietary and behavioral recommendations were given and along with follow-up instructions.      The patient will return as scheduled for follow-up and possible additional banding as required. No complications were encountered and the patient tolerated the procedure well.

## 2016-12-19 NOTE — Patient Instructions (Addendum)
You have been scheduled for your 3rd hemorrhoidal banding on Wednesday, 01/17/17 @ 3:45 pm.   If you are age 54 or older, your body mass index should be between 23-30. Your Body mass index is 30.21 kg/m. If this is out of the aforementioned range listed, please consider follow up with your Primary Care Provider.  If you are age 2 or younger, your body mass index should be between 19-25. Your Body mass index is 30.21 kg/m. If this is out of the aformentioned range listed, please consider follow up with your Primary Care Provider.   HEMORRHOID BANDING PROCEDURE    FOLLOW-UP CARE   1. The procedure you have had should have been relatively painless since the banding of the area involved does not have nerve endings and there is no pain sensation.  The rubber band cuts off the blood supply to the hemorrhoid and the band may fall off as soon as 48 hours after the banding (the band may occasionally be seen in the toilet bowl following a bowel movement). You may notice a temporary feeling of fullness in the rectum which should respond adequately to plain Tylenol or Motrin.  2. Following the banding, avoid strenuous exercise that evening and resume full activity the next day.  A sitz bath (soaking in a warm tub) or bidet is soothing, and can be useful for cleansing the area after bowel movements.     3. To avoid constipation, take two tablespoons of natural wheat bran, natural oat bran, flax, Benefiber or any over the counter fiber supplement and increase your water intake to 7-8 glasses daily.    4. Unless you have been prescribed anorectal medication, do not put anything inside your rectum for two weeks: No suppositories, enemas, fingers, etc.  5. Occasionally, you may have more bleeding than usual after the banding procedure.  This is often from the untreated hemorrhoids rather than the treated one.  Don't be concerned if there is a tablespoon or so of blood.  If there is more blood than this, lie  flat with your bottom higher than your head and apply an ice pack to the area. If the bleeding does not stop within a half an hour or if you feel faint, call our office at [phone] or go to the emergency room.  6. Problems are not common; however, if there is a substantial amount of bleeding, severe pain, chills, fever or difficulty passing urine (very rare) or other problems, you should call us at [phone] or report to the nearest emergency room.  7. Do not stay seated continuously for more than 2-3 hours for a day or two after the procedure.  Tighten your buttock muscles 10-15 times every two hours and take 10-15 deep breaths every 1-2 hours.  Do not spend more than a few minutes on the toilet if you cannot empty your bowel; instead re-visit the toilet at a later time.

## 2016-12-19 NOTE — Telephone Encounter (Signed)
The only other option is for him to start him on a stronger oral anti-inflammatory.  We can call in diclofenac plus Pepcid if he would like otherwise physical therapy is the mainstay.  There are no other topical medications that would be any less expensive

## 2016-12-25 NOTE — Telephone Encounter (Signed)
Called [phone], no answer/no VM.

## 2016-12-26 ENCOUNTER — Encounter: Payer: Medicare HMO | Admitting: Internal Medicine

## 2016-12-27 NOTE — Telephone Encounter (Signed)
Unable to reach pt by phone, letter mailed.

## 2017-01-15 ENCOUNTER — Ambulatory Visit: Payer: Medicare HMO | Admitting: Sports Medicine

## 2017-01-15 DIAGNOSIS — Z0289 Encounter for other administrative examinations: Secondary | ICD-10-CM

## 2017-01-17 ENCOUNTER — Ambulatory Visit (INDEPENDENT_AMBULATORY_CARE_PROVIDER_SITE_OTHER): Payer: Medicare HMO | Admitting: Internal Medicine

## 2017-01-17 ENCOUNTER — Encounter: Payer: Self-pay | Admitting: Internal Medicine

## 2017-01-17 DIAGNOSIS — L290 Pruritus ani: Secondary | ICD-10-CM | POA: Diagnosis not present

## 2017-01-17 DIAGNOSIS — R151 Fecal smearing: Secondary | ICD-10-CM | POA: Diagnosis not present

## 2017-01-17 DIAGNOSIS — K648 Other hemorrhoids: Secondary | ICD-10-CM

## 2017-01-17 NOTE — Progress Notes (Signed)
Parmvir returns today for hemorrhoidal banding #3 Long-standing history of internal hemorrhoids with fecal smearing and perianal itching Initial banding's performed on 12/05/2016 and 12/19/2016 Symptoms marginally better after first 2 banding's. Still is having fecal seepage and smearing requiring him to change underwear. Still some perianal itching due to this irritation Denies rectal bleeding   PROCEDURE NOTE:  The patient presents with symptomatic grade 2 internal hemorrhoids, requesting rubber band ligation of his hemorrhoidal disease.  All risks, benefits and alternative forms of therapy were described and informed consent was obtained.   The anorectum was pre-medicated with 0.125% nitroglycerin ointment The decision was made to band the RP (LL and RA previously banded) internal hemorrhoid, and the [place] was used to perform band ligation without complication.   Digital anorectal examination was then performed to assure proper positioning of the band, and to adjust the banded tissue as required.  The patient was discharged home without pain or other issues.  Dietary and behavioral recommendations were given and along with follow-up instructions.     The following adjunctive treatments were recommended: Add Desitin/zinc oxide cream daily at bedtime Add nonmedicated talcum powder in the morning and after bowel movement to help with perianal wetness and moisture Consideration of surgical opinion if symptoms fail to improve after today's third banding  The patient will return as needed No complications were encountered and the patient tolerated the procedure well.

## 2017-01-17 NOTE — Patient Instructions (Signed)
HEMORRHOID BANDING PROCEDURE    FOLLOW-UP CARE   1. The procedure you have had should have been relatively painless since the banding of the area involved does not have nerve endings and there is no pain sensation.  The rubber band cuts off the blood supply to the hemorrhoid and the band may fall off as soon as 48 hours after the banding (the band may occasionally be seen in the toilet bowl following a bowel movement). You may notice a temporary feeling of fullness in the rectum which should respond adequately to plain Tylenol or Motrin.  2. Following the banding, avoid strenuous exercise that evening and resume full activity the next day.  A sitz bath (soaking in a warm tub) or bidet is soothing, and can be useful for cleansing the area after bowel movements.     3. To avoid constipation, take two tablespoons of natural wheat bran, natural oat bran, flax, Benefiber or any over the counter fiber supplement and increase your water intake to 7-8 glasses daily.    4. Unless you have been prescribed anorectal medication, do not put anything inside your rectum for two weeks: No suppositories, enemas, fingers, etc.  5. Occasionally, you may have more bleeding than usual after the banding procedure.  This is often from the untreated hemorrhoids rather than the treated one.  Don't be concerned if there is a tablespoon or so of blood.  If there is more blood than this, lie flat with your bottom higher than your head and apply an ice pack to the area. If the bleeding does not stop within a half an hour or if you feel faint, call our office at [phone] or go to the emergency room.  6. Problems are not common; however, if there is a substantial amount of bleeding, severe pain, chills, fever or difficulty passing urine (very rare) or other problems, you should call us at [phone] or report to the nearest emergency room.  7. Do not stay seated continuously for more than 2-3 hours for a day or two  after the procedure.  Tighten your buttock muscles 10-15 times every two hours and take 10-15 deep breaths every 1-2 hours.  Do not spend more than a few minutes on the toilet if you cannot empty your bowel; instead re-visit the toilet at a later time.   Use Desitin at night on affected areas and unmedicated talcum powder daily for wetness and moisture control. Follow up as needed.

## 2017-01-27 ENCOUNTER — Ambulatory Visit (INDEPENDENT_AMBULATORY_CARE_PROVIDER_SITE_OTHER): Payer: Medicare HMO | Admitting: Family Medicine

## 2017-01-27 ENCOUNTER — Encounter: Payer: Self-pay | Admitting: Family Medicine

## 2017-01-27 VITALS — BP 130/80 | HR 82 | Temp 98.6°F | Resp 16 | Ht 73.0 in | Wt 228.0 lb

## 2017-01-27 DIAGNOSIS — R739 Hyperglycemia, unspecified: Secondary | ICD-10-CM

## 2017-01-27 DIAGNOSIS — J069 Acute upper respiratory infection, unspecified: Secondary | ICD-10-CM

## 2017-01-27 MED ORDER — BENZONATATE 100 MG PO CAPS: 200.0000 mg | ORAL_CAPSULE | Freq: Two times a day (BID) | ORAL | 0 refills | Status: AC | PRN

## 2017-01-27 MED ORDER — FLUTICASONE PROPIONATE 50 MCG/ACT NA SUSP: 1.0000 | Freq: Two times a day (BID) | NASAL | 0 refills | Status: DC

## 2017-01-27 NOTE — Progress Notes (Signed)
Pre visit review using our clinic review tool, if applicable. No additional management support is needed unless otherwise documented below in the visit note. 

## 2017-01-27 NOTE — Progress Notes (Signed)
HPI:  ACUTE VISIT  Chief Complaint  Patient presents with  . Cough    sneezing, nasal congestion, runny nose, hot at times     [name] is a 55 y.o.male here today complaining of 1-2 days of respiratory symptoms.  Requesting a "penicillin" shot.  Cough  This is a new problem. The current episode started in the past 7 days. The problem has been unchanged. Associated symptoms include a fever, nasal congestion, postnasal drip, rhinorrhea and a sore throat. Pertinent negatives include no chest pain, chills, ear pain, eye redness, headaches, heartburn, hemoptysis, myalgias, rash, shortness of breath, sweats, weight loss or wheezing. There is no history of environmental allergies.    Productive cough with "little" yellowish sputum. + Nasal congestion, rhinorrhea,sore throat, and post nasal drainage. He has not noted fever,chills,or body aches.  No Hx of recent travel. Sick contact: His "roommate" (sister). No known insect bite.  Hx of allergies: No + Smoker, denies wheezing or dyspnea.  OTC medications for this problem: Benadryl  Symptoms otherwise stable.  -He also would like to have glucose test, states that he has been told he is "borderline"   Review of Systems  Constitutional: Positive for fatigue and fever. Negative for appetite change, chills and weight loss.  HENT: Positive for congestion, postnasal drip, rhinorrhea, sneezing and sore throat. Negative for ear pain, mouth sores, nosebleeds, trouble swallowing and voice change.   Eyes: Negative for discharge, redness and itching.  Respiratory: Positive for cough. Negative for hemoptysis, chest tightness, shortness of breath, wheezing and stridor.   Cardiovascular: Negative for chest pain.  Gastrointestinal: Negative for abdominal pain, diarrhea, heartburn, nausea and vomiting.  Endocrine: Negative for polydipsia, polyphagia and polyuria.  Musculoskeletal: Negative for myalgias and neck pain.  Skin:  Negative for rash.  Allergic/Immunologic: Negative for environmental allergies.  Neurological: Negative for weakness and headaches.  Hematological: Negative for adenopathy. Does not bruise/bleed easily.     Current Outpatient Prescriptions on File Prior to Visit  Medication Sig Dispense Refill  . ALPRAZolam (XANAX) 0.5 MG tablet take 1 tablet by mouth twice a day if needed for anxiety 60 tablet 1  . pantoprazole (PROTONIX) 40 MG tablet Take 1 tablet (40 mg total) by mouth daily. 30 tablet 3  . QUEtiapine (SEROQUEL) 100 MG tablet Take 100 mg by mouth at bedtime.     No current facility-administered medications on file prior to visit.      Past Medical History:  Diagnosis Date  . Bipolar 1 disorder ([place])   . Chicken pox   . Diverticulitis   . Diverticulosis   . Fatty liver   . GERD (gastroesophageal reflux disease)   . Hyperplastic colon polyp   . Internal hemorrhoids    No Known Allergies  Social History   Social History  . Marital status: Single    Spouse name: N/A  . Number of children: 0  . Years of education: 12   Occupational History  . Disability     [employer]   Social History Main Topics  . Smoking status: Current Every Day Smoker    Packs/day: 1.00    Types: Cigarettes  . Smokeless tobacco: Never Used  . Alcohol use No  . Drug use: No  . Sexual activity: Not Asked   Other Topics Concern  . None   Social History Narrative  . None    Vitals:   01/27/17 0930  BP: 130/80  Pulse: 82  Resp: 16  Temp: 98.6 F (  37 C)  O2 sat at RA 97%. Body mass index is 30.08 kg/m.   Physical Exam  Nursing note and vitals reviewed. Constitutional: He is oriented to person, place, and time. He appears well-developed. He does not appear ill. No distress.  HENT:  Head: Atraumatic.  Right Ear: Tympanic membrane, external ear and ear canal normal.  Left Ear: Tympanic membrane, external ear and ear canal normal.  Nose: Rhinorrhea and septal deviation present.  Right sinus exhibits no maxillary sinus tenderness and no frontal sinus tenderness. Left sinus exhibits no maxillary sinus tenderness and no frontal sinus tenderness.  Mouth/Throat: Oropharynx is clear and moist and mucous membranes are normal.  Nasal voice. Postnasal drainage.  Eyes: Conjunctivae and EOM are normal.  Cardiovascular: Normal rate and regular rhythm.   No murmur heard. Respiratory: Effort normal and breath sounds normal. No stridor. No respiratory distress.  Lymphadenopathy:       Head (right side): No submandibular adenopathy present.       Head (left side): No submandibular adenopathy present.    He has cervical adenopathy (< 1 cm, no tender.).       Right cervical: Posterior cervical adenopathy present.       Left cervical: Posterior cervical adenopathy present.  Neurological: He is alert and oriented to person, place, and time. He has normal strength. Gait normal.  Skin: Skin is warm. No rash noted. No erythema.  Psychiatric: His mood appears anxious.  Poorly groomed, good eye contact.    ASSESSMENT AND PLAN:    Antonio Lawson was seen today for cough.  Diagnoses and all orders for this visit:  URI, acute  Symptoms suggests a viral etiology, I explained patient that symptomatic treatment is usually recommended in this case, so I do not think abx is needed at this time. Instructed to monitor for signs of complications, including new onset of fever among some, clearly instructed about warning signs. I also explained that cough and nasal congestion can last a few days and sometimes weeks. F/U as needed.    -     fluticasone (FLONASE) 50 MCG/ACT nasal spray; Place 1 spray into both nostrils 2 (two) times daily. -     benzonatate (TESSALON) 100 MG capsule; Take 2 capsules (200 mg total) by mouth 2 (two) times daily as needed for cough.  Hyperglycemia  Finger stick 104, not fasting. Healthy life style for primary prevention recommended. Smoking cessation  encouraged.       Antonio Lawson G. [place], MD  [facility]. [place] office.

## 2017-01-27 NOTE — Patient Instructions (Addendum)
  [name] I have seen you today for an acute visit.  A few things to remember from today's visit:   URI, acute - Plan: fluticasone (FLONASE) 50 MCG/ACT nasal spray, benzonatate (TESSALON) 100 MG capsule     viral infections are self-limited and we treat each symptom depending of severity.  Over the counter medications as decongestants and cold medications usually help, they need to be taken with caution if there is a history of high blood pressure or palpitations. Tylenol and/or Ibuprofen also helps with most symptoms (headache, muscle aching, fever,etc) Plenty of fluids. Honey helps with cough. Steam inhalations helps with runny nose, nasal congestion, and may prevent sinus infections. Cough and nasal congestion could last a few days and sometimes weeks. Please follow in not any better in 1-2 weeks or if symptoms get worse.     Medications prescribed today are intended for short period of time and will not be refill upon request, a follow up appointment might be necessary to discuss continuation of of treatment if appropriate.     In general please monitor for signs of worsening symptoms and seek immediate medical attention if any concerning.  If symptoms are not resolved in 2 weeks you should schedule a follow up appointment with your doctor, before if needed. Please arrange appointment with provider for a physical.

## 2017-02-23 ENCOUNTER — Emergency Department (HOSPITAL_COMMUNITY)
Admission: EM | Admit: 2017-02-23 | Discharge: 2017-02-23 | Disposition: A | Discharge status: Home / Self Care | Payer: Medicare HMO | Attending: Emergency Medicine | Admitting: Emergency Medicine

## 2017-02-23 ENCOUNTER — Encounter (HOSPITAL_COMMUNITY): Payer: Self-pay | Admitting: *Deleted

## 2017-02-23 DIAGNOSIS — M79662 Pain in left lower leg: Secondary | ICD-10-CM | POA: Diagnosis not present

## 2017-02-23 DIAGNOSIS — M79675 Pain in left toe(s): Secondary | ICD-10-CM | POA: Insufficient documentation

## 2017-02-23 DIAGNOSIS — F17210 Nicotine dependence, cigarettes, uncomplicated: Secondary | ICD-10-CM | POA: Diagnosis not present

## 2017-02-23 DIAGNOSIS — L235 Allergic contact dermatitis due to other chemical products: Secondary | ICD-10-CM | POA: Diagnosis not present

## 2017-02-23 DIAGNOSIS — L299 Pruritus, unspecified: Secondary | ICD-10-CM | POA: Diagnosis present

## 2017-02-23 DIAGNOSIS — Z79899 Other long term (current) drug therapy: Secondary | ICD-10-CM | POA: Diagnosis not present

## 2017-02-23 DIAGNOSIS — L253 Unspecified contact dermatitis due to other chemical products: Secondary | ICD-10-CM | POA: Diagnosis not present

## 2017-02-23 MED ORDER — PREDNISONE 10 MG (21) PO TBPK: ORAL_TABLET | Freq: Every day | ORAL | 0 refills | Status: DC

## 2017-02-23 MED ORDER — IBUPROFEN 600 MG PO TABS: 600.0000 mg | ORAL_TABLET | Freq: Four times a day (QID) | ORAL | 0 refills | Status: DC | PRN

## 2017-02-23 MED ORDER — HYDROXYZINE HCL 10 MG PO TABS: 10.0000 mg | ORAL_TABLET | Freq: Four times a day (QID) | ORAL | 0 refills | Status: DC | PRN

## 2017-02-23 NOTE — ED Notes (Signed)
Declined W/C at D/C and was escorted to lobby by RN. 

## 2017-02-23 NOTE — ED Triage Notes (Signed)
To ED for eval of redness and swelling in around eyes after working in yard yesterday. Had same thing happen last year after working in yard- possibly poison ivy per pt. Denies visual changes

## 2017-02-23 NOTE — Discharge Instructions (Signed)
Take your medications as prescribed. I recommend refraining from using the new detergent and soap you are previously using as this may be causing your rash. He may also apply calamine lotion to your rash excluding your face for additional itch relief. I recommend fine up with her primary care provider within the next week if your rash has not improved. Return to the emergency department if symptoms worsen or new onset of fever, facial swelling, difficulty swallowing, difficulty breathing, sensation of throat closing, vomiting, drainage, red streaking.  I recommend continuing to take your prescription of ibuprofen as prescribed. He may also try elevating and icing your toe for additional relief. I recommend wearing proper footwear such as tennis shoes.  I recommend following up with the podiatry clinic listed below in the next 1-2 weeks if your symptoms have not improved. Return to the emergency department with new onset of fever, redness, swelling, warmth, numbness, weakness, decreased range of motion.

## 2017-02-23 NOTE — ED Provider Notes (Signed)
[facility] DEPT Provider Note   CSN: [id number] Arrival date & time: 02/23/17  4782   By signing my name below, I, Antonio Lawson, attest that this documentation has been prepared under the direction and in the presence of [name], [place]. Electronically Signed: Evelene Lawson, Scribe. 02/23/2017. 9:38 AM.  History   Chief Complaint Chief Complaint  Patient presents with  . Eye Pain  . Allergic Reaction   The history is provided by the patient. No language interpreter was used.     HPI Comments:  Antonio Lawson is a 56 y.o. male who presents to the Emergency Department complaining of redness and swelling around the eyes since last night. He notes similar areas of pruritic redness to the groin and hands. Pt states that he was "kissed" by a dog on his face yesterday. He then worked in the garden without gloves and showered with a new soap, oil of olay. He is unsure what caused his symptoms. Pt denies vision changes, eye drainage, eye redness, fever, oral lesions, SOB, dysphagia,drainage and vomiting. No sick contacts at home. Denies applying anything to his rash at home.  Pt is also complaining pain to the left great toe, x 1 year. Pt states he has had an xray done by his PCP last month that showed arthritis in the toe. No acute injury to the toe. He has taken tylenol and naproxen with relief. Denies redness, swelling, warmth, numbness, decreased ROM.  Past Medical History:  Diagnosis Date  . Bipolar 1 disorder ([place])   . Chicken pox   . Diverticulitis   . Diverticulosis   . Fatty liver   . GERD (gastroesophageal reflux disease)   . Hyperplastic colon polyp   . Internal hemorrhoids     Patient Active Problem List   Diagnosis Date Noted  . Left foot pain 12/18/2016  . Hallux limitus of left foot 11/10/2016  . Osteoarthritis of spine with radiculopathy, lumbosacral region 11/09/2016  . Perianal irritation 06/07/2016  . Incontinence of feces 06/07/2016  . Arthralgia  06/01/2016  . Anxiety state 06/01/2016  . Hemorrhoids 03/28/2016  . GERD (gastroesophageal reflux disease) 03/28/2016  . Anal discharge 03/28/2016  . Sleep disturbance 03/28/2016  . Bipolar I disorder ([place]) 03/28/2016  . Overweight (BMI 25.0-29.9) 03/28/2016  . Tobacco use disorder 03/28/2016    Past Surgical History:  Procedure Laterality Date  . APPENDECTOMY    . HERNIA REPAIR         Home Medications    Prior to Admission medications   Medication Sig Start Date End Date Taking? Authorizing Provider  ALPRAZolam Duanne Moron) 0.5 MG tablet take 1 tablet by mouth twice a day if needed for anxiety 10/18/16   Golden Circle, FNP  fluticasone (FLONASE) 50 MCG/ACT nasal spray Place 1 spray into both nostrils 2 (two) times daily. 01/27/17 02/26/17  Martinique, Betty G, MD  hydrOXYzine (ATARAX/VISTARIL) 10 MG tablet Take 1 tablet (10 mg total) by mouth every 6 (six) hours as needed for itching. 02/23/17   Nona Dell, PA-C  ibuprofen (ADVIL,MOTRIN) 600 MG tablet Take 1 tablet (600 mg total) by mouth every 6 (six) hours as needed. 02/23/17   Nona Dell, PA-C  pantoprazole (PROTONIX) 40 MG tablet Take 1 tablet (40 mg total) by mouth daily. 10/17/16   Pyrtle, Lajuan Lines, MD  predniSONE (STERAPRED UNI-PAK 21 TAB) 10 MG (21) TBPK tablet Take by mouth daily. Take 6 tabs by mouth daily  for 2 days, then 5 tabs for 2 days, then  4 tabs for 2 days, then 3 tabs for 2 days, 2 tabs for 2 days, then 1 tab by mouth daily for 2 days 02/23/17   Nona Dell, PA-C  QUEtiapine (SEROQUEL) 100 MG tablet Take 100 mg by mouth at bedtime.    [provider]    Family History Family History  Problem Relation Age of Onset  . Pancreatic cancer Mother   . Heart attack Father   . Stroke Father   . Esophageal cancer Maternal Grandfather     Social History Social History  Substance Use Topics  . Smoking status: Current Every Day Smoker    Packs/day: 1.00    Types: Cigarettes    . Smokeless tobacco: Never Used  . Alcohol use No     Allergies   Patient has no known allergies.   Review of Systems Review of Systems  Constitutional: Negative for fever.  HENT: Positive for facial swelling. Negative for mouth sores and trouble swallowing.   Eyes: Positive for redness. Negative for visual disturbance.  Respiratory: Negative for shortness of breath.   Gastrointestinal: Negative for vomiting.  Musculoskeletal: Positive for arthralgias.  Skin: Positive for rash.     Physical Exam Updated Vital Signs BP (!) 136/92 (BP Location: Right Arm)   Pulse 96   Temp 97.9 F (36.6 C) (Oral)   Resp 17   Ht 6\' 1"  (1.854 m)   Wt 229 lb (103.9 kg)   SpO2 95%   BMI 30.21 kg/m   Physical Exam  Constitutional: He is oriented to person, place, and time. He appears well-developed and well-nourished. No distress.  HENT:  Head: Normocephalic and atraumatic.  Mouth/Throat: Uvula is midline, oropharynx is clear and moist and mucous membranes are normal. No oropharyngeal exudate, posterior oropharyngeal edema, posterior oropharyngeal erythema or tonsillar abscesses. No tonsillar exudate.  No oral lesions  Eyes: Conjunctivae, EOM and lids are normal. Pupils are equal, round, and reactive to light. Lids are everted and swept, no foreign bodies found. Right eye exhibits no chemosis, no discharge, no exudate and no hordeolum. No foreign body present in the right eye. Left eye exhibits no chemosis, no discharge, no exudate and no hordeolum. No foreign body present in the left eye. Right conjunctiva is not injected. Right conjunctiva has no hemorrhage. Left conjunctiva is not injected. Left conjunctiva has no hemorrhage. No scleral icterus.  Neck: Normal range of motion. Neck supple.  Cardiovascular: Normal rate and intact distal pulses.   Pulmonary/Chest: Effort normal.  Abdominal: Soft. He exhibits no distension.  Musculoskeletal: Normal range of motion. He exhibits tenderness. He  exhibits no edema or deformity.  Mild tenderness over left great toe without swelling, erythema, warmth, or rash  FROM of left toes, foot, and ankle Sensation grossly intact; 2+ DP pulse   Neurological: He is alert and oriented to person, place, and time.  Skin: Skin is warm and dry. Capillary refill takes less than 2 seconds. Rash noted. He is not diaphoretic.  Multiple areas of small scattered erythematous papules present to dorsal aspect of bilateral hands and wrist, bilateral inferior periobirtal region and left inguinal region with excoriations present No vesicles, pustules, bulla, or drainage No red streaking No lesions on palms or soles  Nursing note and vitals reviewed.    ED Treatments / Results  DIAGNOSTIC STUDIES:  Oxygen Saturation is 95% on RA, normal by my interpretation.    COORDINATION OF CARE:  9:34 AM Discussed treatment plan with pt at bedside and pt agreed to  plan.  Labs (all labs ordered are listed, but only abnormal results are displayed) Labs Reviewed - No data to display  EKG  EKG Interpretation None       Radiology No results found.  Procedures Procedures (including critical care time)  Medications Ordered in ED Medications - No data to display   Initial Impression / Assessment and Plan / ED Course  I have reviewed the triage vital signs and the nursing notes.  Pertinent labs & imaging results that were available during my care of the patient were reviewed by me and considered in my medical decision making (see chart for details).     Rash consistent with contact dermatitis. Rash does not appear consistent with poison ivy. Patient denies any difficulty breathing or swallowing.  Pt has a patent airway without stridor and is handling secretions without difficulty; no angioedema. No blisters, no pustules, no warmth, no draining sinus tracts, no superficial abscesses, no bullous impetigo, no vesicles, no desquamation, no target lesions with  dusky purpura or a central bulla. Not tender to touch. No concern for superimposed infection. No concern for SJS, TEN, TSS, tick borne illness, syphilis or other life-threatening condition. Will discharge home with short course of steroids, vistaril and recommend using Calamine lotion for pruritis.  Patient presents with left great toe pain that has been present for the past year. Denies any recent fall, trauma or injury. Denies fever, redness, swelling. VSS. Exam revealed mild tenderness over left great toe without swelling, erythema, warmth or rash. Full range of motion of left toe, left lower extremity neurovascularly intact. Suspect patient's symptoms are likely due to his history of arthritis. Do not suspect gout, infection, acute fracture warranting further workup or imaging at this time. Plan to discharge patient home with continued use of NSAIDs and discussed symptomatically treatment. Patient given information to follow up with outpatient podiatry clinic as needed. Discussed return precautions.    Final Clinical Impressions(s) / ED Diagnoses   Final diagnoses:  Contact dermatitis due to other chemical product, unspecified contact dermatitis type  Great toe pain, left    New Prescriptions New Prescriptions   HYDROXYZINE (ATARAX/VISTARIL) 10 MG TABLET    Take 1 tablet (10 mg total) by mouth every 6 (six) hours as needed for itching.   IBUPROFEN (ADVIL,MOTRIN) 600 MG TABLET    Take 1 tablet (600 mg total) by mouth every 6 (six) hours as needed.   PREDNISONE (STERAPRED UNI-PAK 21 TAB) 10 MG (21) TBPK TABLET    Take by mouth daily. Take 6 tabs by mouth daily  for 2 days, then 5 tabs for 2 days, then 4 tabs for 2 days, then 3 tabs for 2 days, 2 tabs for 2 days, then 1 tab by mouth daily for 2 days   I personally performed the services described in this documentation, which was scribed in my presence. The recorded information has been reviewed and is accurate.    Nona Dell,  PA-C 02/23/17 Candelaria Arenas, [place], DO 02/23/17 1631

## 2017-03-13 DIAGNOSIS — M659 Synovitis and tenosynovitis, unspecified: Secondary | ICD-10-CM | POA: Diagnosis not present

## 2017-03-13 DIAGNOSIS — M19072 Primary osteoarthritis, left ankle and foot: Secondary | ICD-10-CM | POA: Diagnosis not present

## 2017-03-13 DIAGNOSIS — M7752 Other enthesopathy of left foot: Secondary | ICD-10-CM | POA: Diagnosis not present

## 2017-03-13 DIAGNOSIS — M7751 Other enthesopathy of right foot: Secondary | ICD-10-CM | POA: Diagnosis not present

## 2017-03-13 DIAGNOSIS — M19071 Primary osteoarthritis, right ankle and foot: Secondary | ICD-10-CM | POA: Diagnosis not present

## 2017-03-19 DIAGNOSIS — F312 Bipolar disorder, current episode manic severe with psychotic features: Secondary | ICD-10-CM | POA: Diagnosis not present

## 2017-03-19 IMAGING — CT CT ABD-PELV W/ CM
2 of 5 series · 16 of 46 positions shown, 18 images · IV contrast (OMNIPAQUE 300)
Comparison: None.

CLINICAL DATA: Left lower quadrant abdominal pain which began on
[REDACTED].

EXAM:
CT ABDOMEN AND PELVIS WITH CONTRAST
TECHNIQUE: Multidetector CT imaging of the abdomen and pelvis was performed
using the standard protocol following bolus administration of
intravenous contrast.
CONTRAST:  50mL OMNIPAQUE IOHEXOL 300 MG/ML SOLN, 100mL OMNIPAQUE
IOHEXOL 300 MG/ML SOLN

[Series 2: abd/pel with · axial · 0.82mm/px · z∈[-530,-100]mm · 13 of 98 slices shown, 15 images]
[im 6/98  soft-tissue]
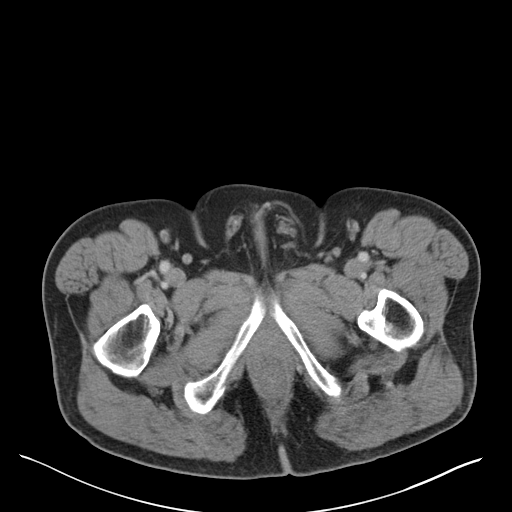
[im 6/98  bone]
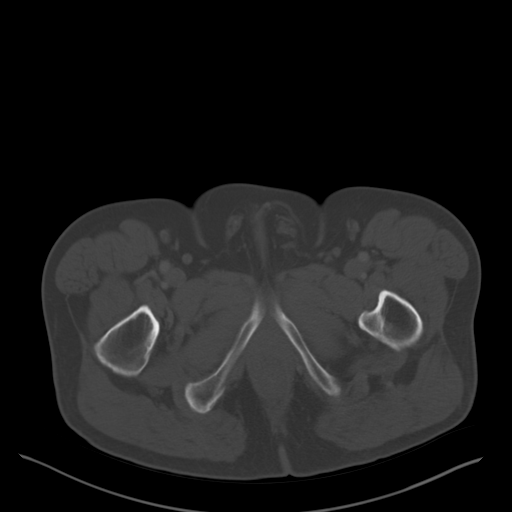
[im 12/98  soft-tissue]
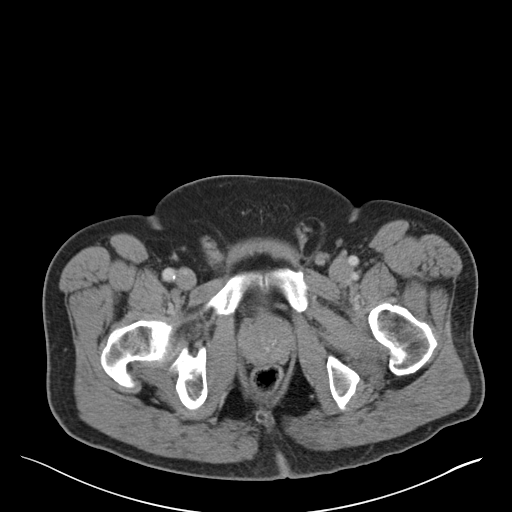
[im 23/98  soft-tissue]
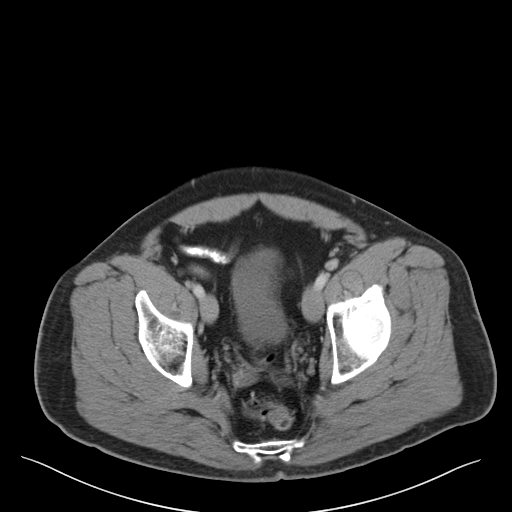
[im 29/98  soft-tissue]
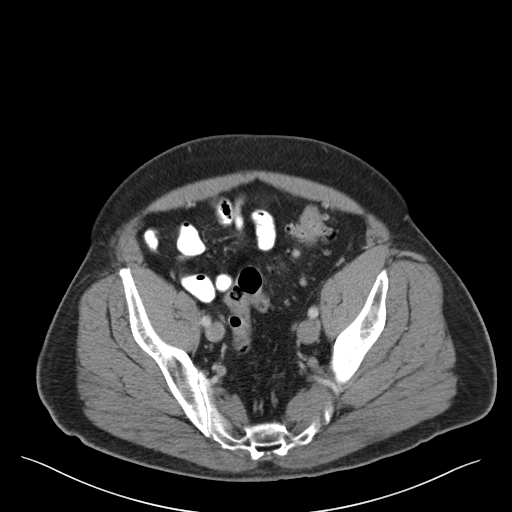
[im 35/98  soft-tissue]
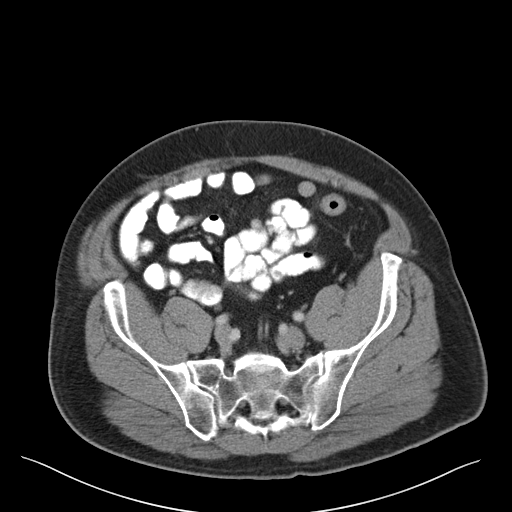
[im 40/98  soft-tissue]
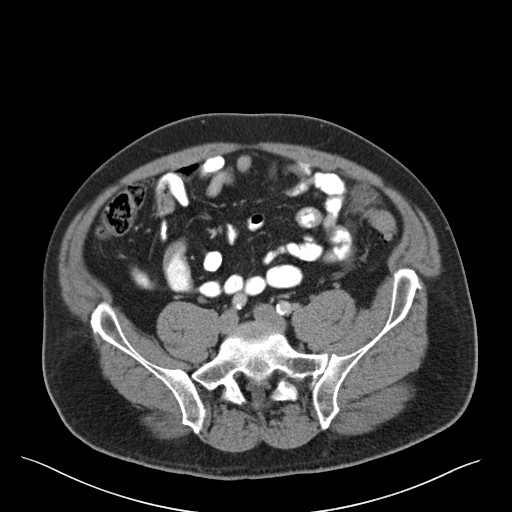
[im 52/98  soft-tissue]
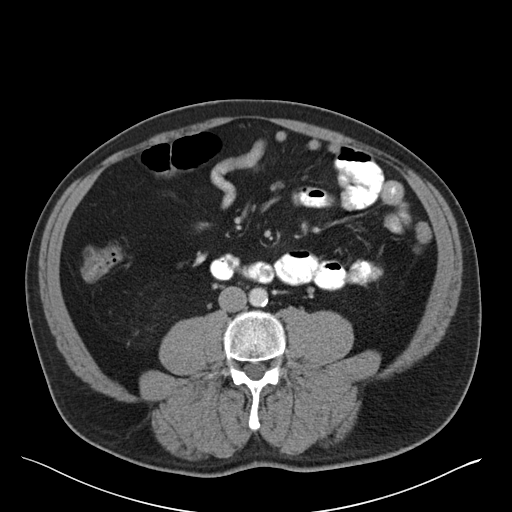
[im 58/98  soft-tissue]
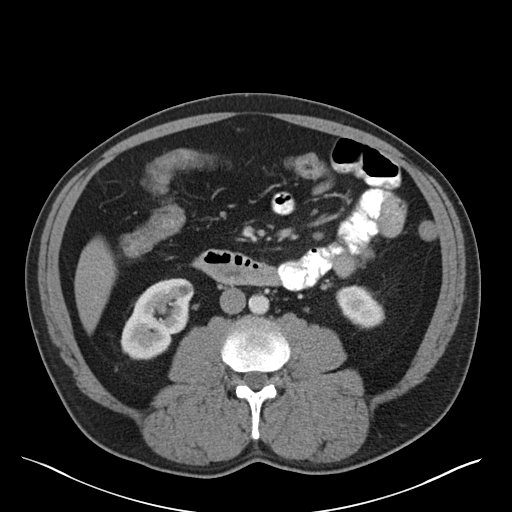
[im 63/98  soft-tissue]
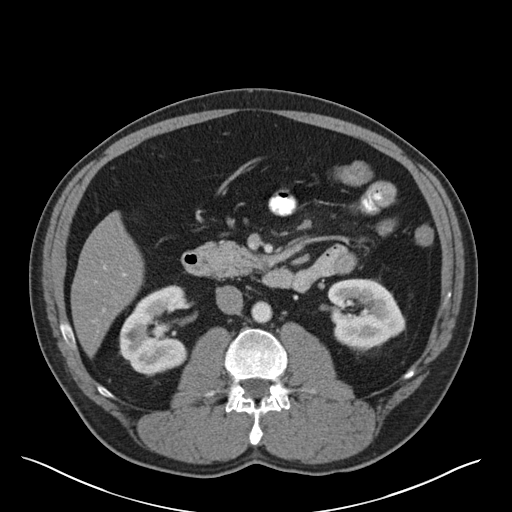
[im 63/98  bone]
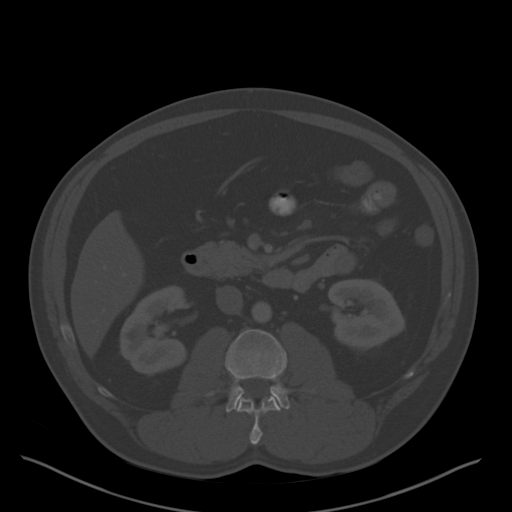
[im 69/98  soft-tissue]
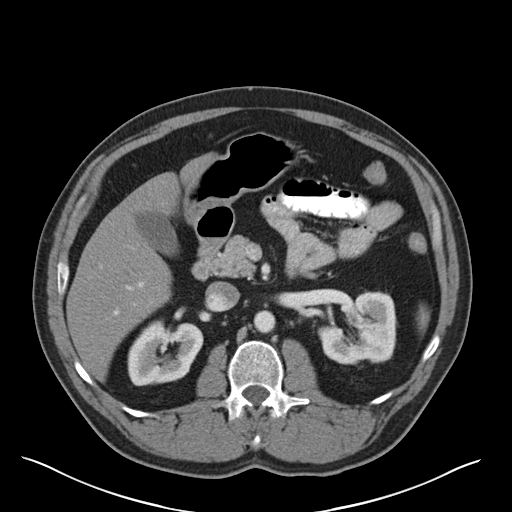
[im 75/98  soft-tissue]
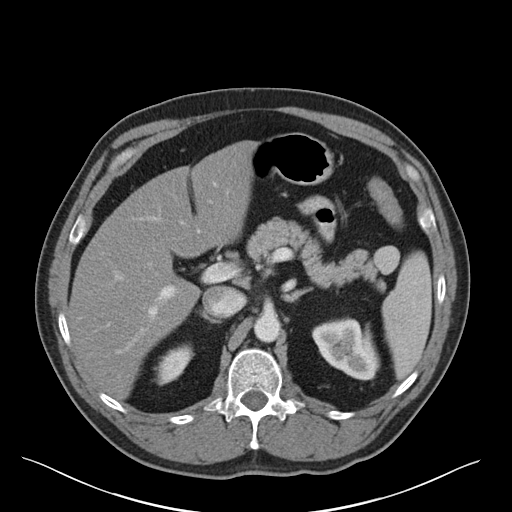
[im 86/98  soft-tissue]
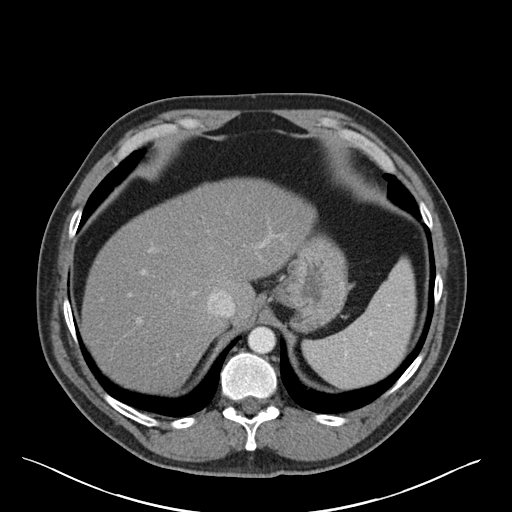
[im 92/98  soft-tissue]
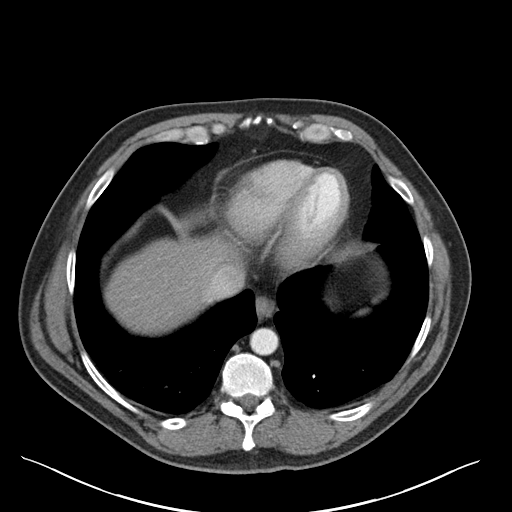

[Series 5: coronal a/|p · coronal · 0.78mm/px · 3 of 109 slices shown]
[im 37/109  soft-tissue]
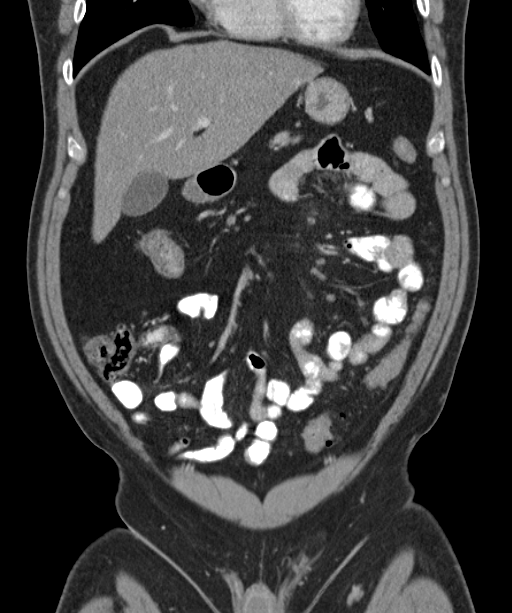
[im 49/109  soft-tissue]
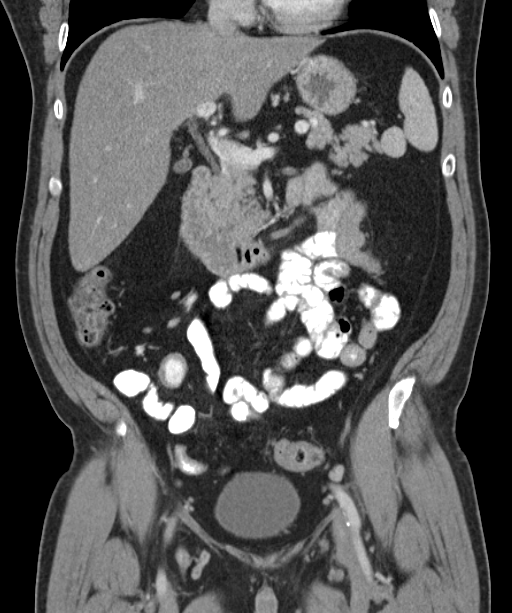
[im 61/109  soft-tissue]
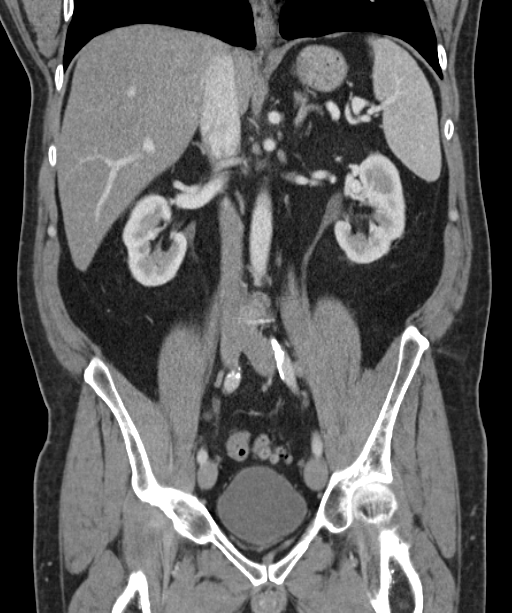

[16 of 46 positions shown; findings below may reference images not displayed]

FINDINGS: Lower chest: The lungs are clear except for dependent subpleural
atelectasis. No pleural effusion. No worrisome pulmonary lesions.
The heart is normal in size. No pericardial effusion. The distal
esophagus is grossly normal.

Hepatobiliary: Mild diffuse fatty infiltration of the liver but no
focal hepatic lesions or intrahepatic biliary dilatation. The
gallbladder is normal. No common bile duct dilatation.

Pancreas: No mass, inflammation or ductal dilatation.

Spleen: Normal size.  No focal lesions.

Adrenals/Urinary Tract: The adrenal glands are normal. No renal
mass, renal calculi or hydronephrosis. Minimal scarring changes are
noted. A few scattered small low-attenuation lesions are likely
benign cysts. No ureteral or bladder calculi or bladder mass.

Stomach/Bowel: The stomach, duodenum and small bowel are
unremarkable. No inflammatory changes, mass lesions or obstructive
findings. The terminal ileum is normal. The appendix is surgically
absent.

There is fairly significant diverticulosis involving the sigmoid
colon. There is a small focus of acute uncomplicated diverticulitis
involving the distal descending colon. No free air or abscess.

Vascular/Lymphatic: No mesenteric or retroperitoneal mass or
adenopathy. The aorta is normal in caliber. The branch vessels are
patent. Distal aortic and proximal iliac artery calcifications are
noted.

Other: The bladder, prostate gland and seminal vesicles are
unremarkable. No pelvic mass or adenopathy. No free pelvic fluid
collections. No inguinal mass or adenopathy.

Musculoskeletal: No significant bony findings.
IMPRESSION: Acute uncomplicated diverticulitis involving the distal descending
colon.

Advanced diverticulosis of the sigmoid colon.

Mild diffuse fatty infiltration of the liver.

## 2017-03-20 DIAGNOSIS — M7751 Other enthesopathy of right foot: Secondary | ICD-10-CM | POA: Diagnosis not present

## 2017-03-20 DIAGNOSIS — M659 Synovitis and tenosynovitis, unspecified: Secondary | ICD-10-CM | POA: Diagnosis not present

## 2017-03-20 DIAGNOSIS — M7752 Other enthesopathy of left foot: Secondary | ICD-10-CM | POA: Diagnosis not present

## 2017-03-27 DIAGNOSIS — M7751 Other enthesopathy of right foot: Secondary | ICD-10-CM | POA: Diagnosis not present

## 2017-03-27 DIAGNOSIS — M659 Synovitis and tenosynovitis, unspecified: Secondary | ICD-10-CM | POA: Diagnosis not present

## 2017-03-27 DIAGNOSIS — M7752 Other enthesopathy of left foot: Secondary | ICD-10-CM | POA: Diagnosis not present

## 2017-04-10 ENCOUNTER — Encounter: Payer: Self-pay | Admitting: Podiatry

## 2017-04-10 ENCOUNTER — Ambulatory Visit (INDEPENDENT_AMBULATORY_CARE_PROVIDER_SITE_OTHER): Payer: Medicare HMO | Admitting: Podiatry

## 2017-04-10 VITALS — BP 112/80 | HR 91 | Ht 73.0 in | Wt 228.0 lb

## 2017-04-10 DIAGNOSIS — M205X1 Other deformities of toe(s) (acquired), right foot: Secondary | ICD-10-CM

## 2017-04-10 DIAGNOSIS — M25579 Pain in unspecified ankle and joints of unspecified foot: Secondary | ICD-10-CM

## 2017-04-10 DIAGNOSIS — M21619 Bunion of unspecified foot: Secondary | ICD-10-CM | POA: Diagnosis not present

## 2017-04-10 DIAGNOSIS — M2021 Hallux rigidus, right foot: Secondary | ICD-10-CM

## 2017-04-10 DIAGNOSIS — M2022 Hallux rigidus, left foot: Secondary | ICD-10-CM

## 2017-04-10 NOTE — Progress Notes (Signed)
SUBJECTIVE: 56 y.o. year old male complaining of bilateral foot pain. Big toes are bothering him for over 2 years. He understands that he has lost cartilage in the big toe joint, which is making them hurt to walk.  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 Prior to this visit he was treated with injection, which did not help. Stated that he is not able to take time off more than a couple of weeks at the most.  He prefers not to have any implants since his sister had reaction with Titanium implant on knee joint.  Patient was referred by Dr. Leonette Nutting.   REVIEW OF SYSTEMS: Pertinent items noted in HPI and remainder of comprehensive ROS otherwise negative.  OBJECTIVE: DERMATOLOGIC EXAMINATION: No abnormal skin lesions.  VASCULAR EXAMINATION OF LOWER LIMBS: All pedal pulses are palpable with normal pulsation.  Capillary Filling times within 3 seconds in all digits.  No visible edema or erythema noted. Temperature gradient from tibial crest to dorsum of foot is within normal bilateral.  NEUROLOGIC EXAMINATION OF THE LOWER LIMBS: All epicritic and tactile sensations grossly intact. Sharp and Dull discriminatory sensations at the plantar ball of hallux is intact bilateral.   MUSCULOSKELETAL EXAMINATION: Enlarged first metatarsal head. Positive for elevated first metatarsal, limited dorsiflexion of the first MPJ with loading of fore foot. Pain with range of first MPJ motion.   Patient brought x-ray from Dr. Lindley Magnus office, which reveal, Enlarged, flat metatarsal head with narrowing of joint space at the first MPJ bilateral. Lateral view:  Excess bone formation at the joint margins of the first metatarsal head.  ASSESSMENT: Arthralgia first MPJ bilateral. Hallux limitus bilateral. Pain with ambulation;  PLAN: Reviewed clinical findings and available treatment options. Patient requested of Antonio Lawson type osteotomy that does not  requiring joint implat to reduce foot pain. Surgery consent form reviewed for left foot surgery.

## 2017-04-10 NOTE — Patient Instructions (Signed)
Seen for pain in first MPJ on both feet. Noted of loss of cartilage with pain upon joint motion. Reviewed available treatment options. As per request, Antonio Lawson bunionectomy procedure, (removal of the base of proximal phalangeal bone) was reviewed.

## 2017-04-16 ENCOUNTER — Telehealth: Payer: Self-pay | Admitting: Internal Medicine

## 2017-04-16 NOTE — Telephone Encounter (Signed)
Attempted to return call on the [phone] # and it is not a working number.  Unable to leave a message on the other phone number.

## 2017-04-17 NOTE — Telephone Encounter (Signed)
Pt had Hem banding by Dr. Hilarie Fredrickson and states that now he has some skin hanging out of his rectum and it is very painful. States he cannot sit down. Pt scheduled to see [name] PA tomorrow at 1:45pm. Pt aware of appt.

## 2017-04-17 NOTE — Telephone Encounter (Signed)
Left message for pt to call back  °

## 2017-04-17 NOTE — Telephone Encounter (Signed)
Patient left message with answering service on 04/16/17 @ 5:51pm stating that he has a piece of skin hanging out of rectum and is not sure what to do. # that patient called on was [phone]

## 2017-04-18 ENCOUNTER — Encounter: Payer: Self-pay | Admitting: Physician Assistant

## 2017-04-18 ENCOUNTER — Ambulatory Visit (INDEPENDENT_AMBULATORY_CARE_PROVIDER_SITE_OTHER): Payer: Medicare HMO | Admitting: Physician Assistant

## 2017-04-18 VITALS — BP 110/70 | HR 80 | Ht 73.0 in | Wt 227.0 lb

## 2017-04-18 DIAGNOSIS — K645 Perianal venous thrombosis: Secondary | ICD-10-CM

## 2017-04-18 MED ORDER — HYDROCORTISONE 2.5 % RE CREA: 1.0000 "application " | TOPICAL_CREAM | Freq: Two times a day (BID) | RECTAL | 1 refills | Status: DC

## 2017-04-18 NOTE — Progress Notes (Addendum)
Chief Complaint: "something hanging out of my rectum", Rectal pain  HPI:   [name] is a 56 year old male with a past medical history as listed below including internal hemorrhoids status post 3 separate bandings with the last 01/17/17. Patient presents to clinic today with a complaint of "something is hanging out of my rectum", and rectal pain.   Patient has followed with Dr. Hilarie Fredrickson for all of his banding's. When last done patient was also complaining of fecal seepage and smearing requiring him to change his underwear as well as some perianal itching due to this irritation.  At that time adjunctive treatments were recommended including Destin/Zinc oxide cream daily at bedtime and non-medicated talcum powder in the morning and after bowel movements to help with peri-anal wetness and moisture. It was discussed that if this did not improve his symptoms then he would need a surgical opinion.   Today, the patient presents to clinic and tells me that about 4 days ago he developed excruciating rectal pain, so much so that "I can't sit down". Patient tells me that he has had this occur off and on over the past 7 years," but nothing was ever done about it". He did undergo internal hemorrhoid banding which was successful per Dr. Hilarie Fredrickson, but patient tells me that he has always had this "horn of skin" sticking out by his rectum that he can feel and sometimes it hurts sometimes more than others. Patient did do a sitz bath last night and did apply Vaseline to this area which only helped a small amount. He has never had Hydrocortisone ointment for hemorrhoids in the past. He denies any bleeding.   Patient denies fever, chills, blood in the stool, melena, weight loss, fatigue, Nexium, nausea, going, heartburn or reflux or abdominal pain.  Past Medical History:  Diagnosis Date  . Bipolar 1 disorder ([place])   . Chicken pox   . Diverticulitis   . Diverticulosis   . Fatty liver   . GERD (gastroesophageal reflux  disease)   . Hyperplastic colon polyp   . Internal hemorrhoids     Past Surgical History:  Procedure Laterality Date  . APPENDECTOMY    . HERNIA REPAIR      Current Outpatient Prescriptions  Medication Sig Dispense Refill  . ALPRAZolam (XANAX) 0.5 MG tablet take 1 tablet by mouth twice a day if needed for anxiety (Patient not taking: Reported on 04/10/2017) 60 tablet 1  . fluticasone (FLONASE) 50 MCG/ACT nasal spray Place 1 spray into both nostrils 2 (two) times daily. 16 g 0  . hydrOXYzine (ATARAX/VISTARIL) 10 MG tablet Take 1 tablet (10 mg total) by mouth every 6 (six) hours as needed for itching. (Patient not taking: Reported on 04/10/2017) 30 tablet 0  . ibuprofen (ADVIL,MOTRIN) 600 MG tablet Take 1 tablet (600 mg total) by mouth every 6 (six) hours as needed. (Patient not taking: Reported on 04/10/2017) 30 tablet 0  . pantoprazole (PROTONIX) 40 MG tablet Take 1 tablet (40 mg total) by mouth daily. (Patient not taking: Reported on 04/10/2017) 30 tablet 3  . predniSONE (DELTASONE) 10 MG tablet TAKE 6 TABLETS BY MOUTH DAILY FOR 2 DAYS THEN 5 TABS FOR 2 DAYS T...  (REFER TO PRESCRIPTION NOTES).  0  . predniSONE (STERAPRED UNI-PAK 21 TAB) 10 MG (21) TBPK tablet Take by mouth daily. Take 6 tabs by mouth daily  for 2 days, then 5 tabs for 2 days, then 4 tabs for 2 days, then 3 tabs for 2 days, 2  tabs for 2 days, then 1 tab by mouth daily for 2 days (Patient not taking: Reported on 04/10/2017) 42 tablet 0  . QUEtiapine (SEROQUEL) 100 MG tablet Take 100 mg by mouth at bedtime.     No current facility-administered medications for this visit.     Allergies as of 04/18/2017  . (No Known Allergies)    Family History  Problem Relation Age of Onset  . Pancreatic cancer Mother   . Heart attack Father   . Stroke Father   . Esophageal cancer Maternal Grandfather     Social History   Social History  . Marital status: Single    Spouse name: N/A  . Number of children: 0  . Years of education: 12    Occupational History  . Disability     [employer]   Social History Main Topics  . Smoking status: Current Every Day Smoker    Packs/day: 1.00    Types: Cigarettes  . Smokeless tobacco: Never Used  . Alcohol use No  . Drug use: No  . Sexual activity: Not on file   Other Topics Concern  . Not on file   Social History Narrative  . No narrative on file    Review of Systems:    Constitutional: No weight loss, fever or chills Cardiovascular: No chest pain Respiratory: No SOB  Gastrointestinal: See HPI and otherwise negative   Physical Exam:  Vital signs: BP 110/70   Pulse 80   Ht 6\' 1"  (1.854 m)   Wt 227 lb (103 kg)   BMI 29.95 kg/m    Constitutional:   Pleasant Caucasian male appears to be in NAD, Well developed, Well nourished, alert and cooperative Respiratory: Respirations even and unlabored. Lungs clear to auscultation bilaterally.   No wheezes, crackles, or rhonchi.  Cardiovascular: Normal S1, S2. No MRG. Regular rate and rhythm. No peripheral edema, cyanosis or pallor.  Gastrointestinal:  Soft, nondistended, nontender. No rebound or guarding. Normal bowel sounds. No appreciable masses or hepatomegaly. Rectal:  External exam: thrombosed external hemorrhoid and multiple hemorrhoid tags as well as brown fecal material, tender to palpation; Internal exam not done today due to pain Psychiatric:  Demonstrates good judgement and reason.   No recent labs or imaging  Assessment: 1. Thrombosed external hemorrhoid:Seen at time of exam today, pain started 4 days ago, likely I&D at this point will not help, but patient would like urgent eval by surgical team, if for nothing else but to discuss long term options including hemorrhoidectomy   Plan: 1. Referred patient to [facility] to discuss external hemorrhoids and possible treatment including possible I&D now as patient is unable to sit 2. Prescribed Hydrocortisone ointment BID x14 days and then prn 3. Recommend High fiber diet 4.  Recommend patient do Sitz baths TID 5. He can use OTC Recta-care cream with lidocaine prn 6. Patient to follow in clinic with Dr. Hilarie Fredrickson as needed in the near future  [name], PA-C [facility] 04/18/2017, 2:00 PM  Cc: Hoyt Koch, *   Addendum: Reviewed and agree with initial management. Pyrtle, Lajuan Lines, MD

## 2017-04-18 NOTE — Patient Instructions (Signed)
Please purchase the following medications over the counter and take as directed: Reticare with lidocaine as needed.   We have sent a prescription for Hydrocortisone cream to your pharmacy. You should apply a pea size amount to your rectum twice a day x 14 days. Insert up into rectum until the first knuckle of your index finger.  Try to do sitz baths three times a day.   You have been scheduled for an appointment with [facility]. Your appointment is on 04/18/17 at 3:30 pm. Please arrive at 3 pm for registration. Make certain to bring a list of current medications, including any over the counter medications or vitamins. Also bring your co-pay if you have one as well as your insurance cards. [facility] is located at [address]. Should you need to reschedule your appointment, please contact them at [phone].

## 2017-04-19 ENCOUNTER — Telehealth: Payer: Self-pay | Admitting: Internal Medicine

## 2017-04-19 NOTE — Telephone Encounter (Signed)
Spoke with pt and let him know he needs to call [facility] at [phone], pt aware.

## 2017-04-26 ENCOUNTER — Ambulatory Visit (INDEPENDENT_AMBULATORY_CARE_PROVIDER_SITE_OTHER): Payer: Medicare HMO | Admitting: Podiatry

## 2017-04-26 ENCOUNTER — Encounter: Payer: Self-pay | Admitting: Podiatry

## 2017-04-26 ENCOUNTER — Ambulatory Visit (INDEPENDENT_AMBULATORY_CARE_PROVIDER_SITE_OTHER): Payer: Medicare HMO

## 2017-04-26 VITALS — BP 112/77 | HR 81 | Resp 18

## 2017-04-26 DIAGNOSIS — M205X2 Other deformities of toe(s) (acquired), left foot: Secondary | ICD-10-CM | POA: Diagnosis not present

## 2017-04-26 DIAGNOSIS — R52 Pain, unspecified: Secondary | ICD-10-CM

## 2017-04-26 NOTE — Progress Notes (Signed)
   Subjective:    Patient ID: Antonio Lawson, male    DOB: [birth year], 56 y.o.   MRN: [id number]  HPI  Antonio Lawson Since the office today for concerns of left big toe pain for second opinion in regards to surgery. He states that he originally saw Dr. Gershon Mussel and he was told that he had "cartilage loss" to his big toe on his left foot and a [place] to surgery. He followed up with Dr. Caffie Pinto and surgical intervention was discussed including the [facility] without implant bunionectomy. The patient states he is concerned about surgery and what the further discuss this. He's had no other significant treatment other than having multiple cortisone injections into the area. He has not tried orthotics were shoe modifications. No recent injury or trauma. The pain is worsening as lateral walking. Other concerns today.  Review of Systems  All other systems reviewed and are negative.      Objective:   Physical Exam General: AAO x3, NAD  Dermatological: Skin is warm, dry and supple bilateral. Nails x 10 are well manicured; remaining integument appears unremarkable at this time. There are no open sores, no preulcerative lesions, no rash or signs of infection present.  Vascular: Dorsalis Pedis artery and Posterior Tibial artery pedal pulses are 2/4 bilateral with immedate capillary fill time. There is no pain with calf compression, swelling, warmth, erythema.   Neruologic: Grossly intact via light touch bilateral. Vibratory intact via tuning fork bilateral. Protective threshold with Semmes Wienstein monofilament intact to all pedal sites bilateral.  Musculoskeletal: There is decreased range motion left first MTPJ mild crepitation with MPJ range of motion. There is tenderness with a long the MPJ. There is no other area tenderness. There is no significant edema, erythema, increase in warmth. Muscular strength 5/5 in all groups tested bilateral.  Gait: Unassisted, Nonantalgic.      Assessment & Plan:  Hallux  limitus left foot -Treatment options discussed including all alternatives, risks, and complications -Etiology of symptoms were discussed -X-rays were obtained and reviewed with the patient. Arthritic changes present in the first MPJ. No evidence of acute fracture identified today. -I had a long discussion today in regards to both conservative and surgical treatment options. He is very has not surgical intervention. I discussed with him conservative treatments including custom orthotics due to cost she wishes to hold off on this. I discussed with him a change of shoes and trying to wear stiffer type shoe to help prevent the toe from bending. He's, start with this. In regards to surgery were discussed various treatment options but he going to hold off on that at this point.  Celesta Gentile, DPM

## 2017-05-01 DIAGNOSIS — K644 Residual hemorrhoidal skin tags: Secondary | ICD-10-CM | POA: Diagnosis not present

## 2017-05-03 ENCOUNTER — Telehealth: Payer: Self-pay | Admitting: Internal Medicine

## 2017-05-03 NOTE — Telephone Encounter (Signed)
Discussed with pt that he needs to call [facility] back if he wants to have the hemorhoid removed. Explained that our doctors band the internal hemorrhoids, we do not remove external ones. Pt instructed to call [facility] back.

## 2017-06-05 ENCOUNTER — Encounter: Payer: Self-pay | Admitting: Podiatry

## 2017-06-05 ENCOUNTER — Ambulatory Visit (INDEPENDENT_AMBULATORY_CARE_PROVIDER_SITE_OTHER): Payer: Medicare HMO | Admitting: Podiatry

## 2017-06-05 DIAGNOSIS — M2021 Hallux rigidus, right foot: Secondary | ICD-10-CM | POA: Diagnosis not present

## 2017-06-05 DIAGNOSIS — M25579 Pain in unspecified ankle and joints of unspecified foot: Secondary | ICD-10-CM | POA: Diagnosis not present

## 2017-06-05 DIAGNOSIS — M205X2 Other deformities of toe(s) (acquired), left foot: Secondary | ICD-10-CM

## 2017-06-05 DIAGNOSIS — M2022 Hallux rigidus, left foot: Secondary | ICD-10-CM

## 2017-06-05 NOTE — Progress Notes (Signed)
SUBJECTIVE: 56 y.o. year old male complaining of bilateral foot pain. Left great toe is very painful and ready to have surgery done. He had pre op visit and consent form reviewed during his last visit. Then he cancelled surgery.   HPI: Big toes are bothering him for over 2 years. He understands that he has lost cartilage in the big toe joint, which is making them hurt to walk.  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 Prior to this visit he was treated with injection, which did not help. He prefers not to have any implants since his sister had reaction with Titanium implant on knee joint.  Patient was referred by Dr. Leonette Nutting.   REVIEW OF SYSTEMS: Pertinent items noted in HPI and remainder of comprehensive ROS otherwise negative.  OBJECTIVE: DERMATOLOGIC EXAMINATION: No abnormal skin lesions.  VASCULAR EXAMINATION OF LOWER LIMBS: All pedal pulses are palpable with normal pulsation.  Capillary Filling times within 3 seconds in all digits.  No visible edema or erythema noted. Temperature gradient from tibial crest to dorsum of foot is within normal bilateral.  NEUROLOGIC EXAMINATION OF THE LOWER LIMBS: All epicritic and tactile sensations grossly intact. Sharp and Dull discriminatory sensations at the plantar ball of hallux is intact bilateral.   MUSCULOSKELETAL EXAMINATION: Enlarged first metatarsal head. Positive for elevated first metatarsal, limited dorsiflexion of the first MPJ with loading of fore foot. Pain with range of first MPJ motion bilateral.   Patient brought x-ray from Dr. Lindley Magnus office, which reveal, Enlarged, flat metatarsal head with narrowing of joint space at the first MPJ bilateral. Lateral view:  Excess bone formation at the joint margins of the first metatarsal head left foot.  ASSESSMENT: Arthralgia first MPJ bilateral L>R. Hallux limitus bilateral. Pain with ambulation;  PLAN: As per  previous discussion, consent form reviewed for Keller type osteotomy that does not requiring joint implat to reduce foot pain. Answered all questions.

## 2017-06-05 NOTE — Patient Instructions (Signed)
Pain in left great toe joint. Pre op paper work done. Will schedule Keller bunionectomy of left foot at [facility] surgery center. Call with any question.

## 2017-06-14 ENCOUNTER — Other Ambulatory Visit: Payer: Self-pay | Admitting: Podiatry

## 2017-06-14 MED ORDER — OXYCODONE-ACETAMINOPHEN 7.5-325 MG PO TABS: 1.0000 | ORAL_TABLET | Freq: Four times a day (QID) | ORAL | 0 refills | Status: DC | PRN

## 2017-06-18 DIAGNOSIS — M2012 Hallux valgus (acquired), left foot: Secondary | ICD-10-CM | POA: Diagnosis not present

## 2017-06-20 ENCOUNTER — Encounter: Payer: Medicare HMO | Admitting: Podiatry

## 2017-06-21 ENCOUNTER — Ambulatory Visit (INDEPENDENT_AMBULATORY_CARE_PROVIDER_SITE_OTHER): Payer: Medicare HMO | Admitting: Podiatry

## 2017-06-21 ENCOUNTER — Encounter: Payer: Self-pay | Admitting: Podiatry

## 2017-06-21 DIAGNOSIS — M205X2 Other deformities of toe(s) (acquired), left foot: Secondary | ICD-10-CM

## 2017-06-21 MED ORDER — AMOXICILLIN-POT CLAVULANATE 875-125 MG PO TABS: 1.0000 | ORAL_TABLET | Freq: Two times a day (BID) | ORAL | 0 refills | Status: DC

## 2017-06-21 MED ORDER — PERCOCET 10-325 MG PO TABS
1.0000 | ORAL_TABLET | Freq: Four times a day (QID) | ORAL | 0 refills | Status: DC | PRN
Start: 2017-06-21 — End: 2017-06-25

## 2017-06-21 MED ORDER — OXYCODONE-ACETAMINOPHEN 10-325 MG PO TABS: 1.0000 | ORAL_TABLET | Freq: Four times a day (QID) | ORAL | 0 refills | Status: DC | PRN

## 2017-06-21 NOTE — Patient Instructions (Signed)
3 days post op wound healing normal. Pain medication re ordered. May take antibiotics as prescribed. Return in one week.

## 2017-06-21 NOTE — Progress Notes (Signed)
3 days post op following McBride bunionectomy left 06/15/17. Had pain at dorsum and medial aspect of the right foot. Patient complained of having generic pain medication that has not helped. Dressing changed. Incision area is dry. No drainage or open gap noted. Good range of motion but with pain. Inner dressing had small amount of dry blood.  Some erythema at medial aspect of the first metatarsal. Possible from tight bandage.  Post op pain medication and antibiotic prescribed.

## 2017-06-25 ENCOUNTER — Telehealth: Payer: Self-pay | Admitting: *Deleted

## 2017-06-25 MED ORDER — OXYCODONE-ACETAMINOPHEN 10-325 MG PO TABS: 1.0000 | ORAL_TABLET | Freq: Four times a day (QID) | ORAL | 0 refills | Status: DC | PRN

## 2017-06-25 NOTE — Telephone Encounter (Signed)
06/25/17 Patient called this am and requested generic prescription for Percocet, the regular percocet cost 900.00 he says. Can call into the [facility] on [address]. Call back ph# [phone]

## 2017-06-26 ENCOUNTER — Ambulatory Visit: Payer: Medicare HMO | Admitting: Nurse Practitioner

## 2017-06-28 ENCOUNTER — Ambulatory Visit (INDEPENDENT_AMBULATORY_CARE_PROVIDER_SITE_OTHER): Payer: Medicare HMO | Admitting: Podiatry

## 2017-06-28 ENCOUNTER — Encounter: Payer: Self-pay | Admitting: Podiatry

## 2017-06-28 DIAGNOSIS — Z9889 Other specified postprocedural states: Secondary | ICD-10-CM

## 2017-06-28 NOTE — Progress Notes (Signed)
S/P McBride bunionectomy left 06/15/17.  Noted of increased edema and erythema along the incision site. Stated that he had to do more things and on feet much. Advised to reduce activity. Do range of motion while sitting.  Return in one week.

## 2017-06-28 NOTE — Patient Instructions (Signed)
2 weeks post op wound healing with increased pain and swelling. Need be off of feet more. Suture removed. Finish antibiotics. Keep compression bandage. Return in 1 week.

## 2017-07-05 ENCOUNTER — Encounter: Payer: Medicare HMO | Admitting: Podiatry

## 2017-07-09 ENCOUNTER — Ambulatory Visit (INDEPENDENT_AMBULATORY_CARE_PROVIDER_SITE_OTHER): Payer: Medicare HMO | Admitting: Podiatry

## 2017-07-09 ENCOUNTER — Encounter: Payer: Self-pay | Admitting: Podiatry

## 2017-07-09 DIAGNOSIS — Z9889 Other specified postprocedural states: Secondary | ICD-10-CM

## 2017-07-09 MED ORDER — OXYCODONE-ACETAMINOPHEN 10-325 MG PO TABS: 1.0000 | ORAL_TABLET | Freq: Four times a day (QID) | ORAL | 0 refills | Status: DC | PRN

## 2017-07-09 NOTE — Patient Instructions (Signed)
2 weeks post op wound healing normal with some redness and swelling due to increased activity.  Need be off of feet more. Pain medication re ordered. Return in 2 weeks.

## 2017-07-09 NOTE — Progress Notes (Signed)
S/P McBride bunionectomy left 06/15/17. Swollen with mild hyperemia over surgical site. He was not able to stay off of his feet. Post op edema with increased activity. Advised to stay off of feet more. Return in 2 weesk.

## 2017-07-24 ENCOUNTER — Encounter: Payer: Self-pay | Admitting: Podiatry

## 2017-07-24 ENCOUNTER — Ambulatory Visit (INDEPENDENT_AMBULATORY_CARE_PROVIDER_SITE_OTHER): Payer: Medicare HMO | Admitting: Podiatry

## 2017-07-24 DIAGNOSIS — M21612 Bunion of left foot: Secondary | ICD-10-CM | POA: Diagnosis not present

## 2017-07-24 DIAGNOSIS — M21619 Bunion of unspecified foot: Secondary | ICD-10-CM | POA: Diagnosis not present

## 2017-07-24 DIAGNOSIS — M205X2 Other deformities of toe(s) (acquired), left foot: Secondary | ICD-10-CM | POA: Diagnosis not present

## 2017-07-24 MED ORDER — OXYCODONE-ACETAMINOPHEN 10-325 MG PO TABS: 1.0000 | ORAL_TABLET | Freq: Four times a day (QID) | ORAL | 0 refills | Status: DC | PRN

## 2017-07-24 NOTE — Patient Instructions (Signed)
6 weeks post op following left foot bunionectomy. Post op x-ray is normal. Pain medication re ordered. Continue to increase range of motion and increase activity as tolerated. Return as needed.

## 2017-07-24 NOTE — Progress Notes (Signed)
6 weeks S/P McBride bunionectomy left foot 06/15/17. States still sore at the surgery site. Been up and around on feet. Wound has healed well with mild edema over incision area of the first MPJ left foot. Post op X-ray done on left foot. Clean surgical area of affected metatarsal head with narrowing of the joint space. Reviewed findings with the patient. Explained the surgery consisted of cleaning spurs and excess abnormal bony growth. If pain continues, it may require further intervention such as biplane [facility] or Arthroplasty procedure. Pain medication prescribed as per request. Patient is to come back as needed.

## 2017-08-01 DIAGNOSIS — F312 Bipolar disorder, current episode manic severe with psychotic features: Secondary | ICD-10-CM | POA: Diagnosis not present

## 2017-08-03 ENCOUNTER — Telehealth: Payer: Self-pay | Admitting: *Deleted

## 2017-08-03 NOTE — Telephone Encounter (Signed)
08/03/17 Patient called this afternoon and requested a RX for Pain. He can pick up on Monday.

## 2017-08-06 MED ORDER — OXYCODONE-ACETAMINOPHEN 10-325 MG PO TABS: 1.0000 | ORAL_TABLET | Freq: Three times a day (TID) | ORAL | 0 refills | Status: DC | PRN

## 2017-08-20 ENCOUNTER — Ambulatory Visit (INDEPENDENT_AMBULATORY_CARE_PROVIDER_SITE_OTHER): Payer: Medicare HMO | Admitting: Podiatry

## 2017-08-20 DIAGNOSIS — Z9889 Other specified postprocedural states: Secondary | ICD-10-CM

## 2017-08-20 MED ORDER — OXYCODONE-ACETAMINOPHEN 10-325 MG PO TABS: 1.0000 | ORAL_TABLET | Freq: Three times a day (TID) | ORAL | 0 refills | Status: DC | PRN

## 2017-08-20 NOTE — Patient Instructions (Signed)
2 month post op left foot. Still experiencing pain. Cortisone injection given. Return as needed.

## 2017-08-20 NOTE — Progress Notes (Signed)
Left foot pain at the first MPJ at surgery area, McBride bunionectomy, 06/15/17.  Reviewed findings.   Assessment: Unresolved osteo artrhopathy first MPJ left foot.  Post op wound healing normal with minimum edema.  Plan: Reviewed findings. Left first MPJ injected with mixture of 4 mg Dexamethasone, 4 mg Triamcinolone, and 1 cc of 0.5% Marcaine plain. Patient tolerated well without difficulty.  If problem continues, may require more involved osteotomy of the first ray left foot. Return as needed.

## 2017-08-21 ENCOUNTER — Encounter: Payer: Self-pay | Admitting: Podiatry

## 2017-08-25 ENCOUNTER — Observation Stay (HOSPITAL_COMMUNITY): Payer: Medicare HMO

## 2017-08-25 ENCOUNTER — Encounter (HOSPITAL_COMMUNITY): Payer: Self-pay | Admitting: Emergency Medicine

## 2017-08-25 ENCOUNTER — Observation Stay (HOSPITAL_COMMUNITY)
Admission: EM | Admit: 2017-08-25 | Discharge: 2017-08-27 | Discharge status: Against Medical Advice | Payer: Medicare HMO | Attending: Internal Medicine | Admitting: Internal Medicine

## 2017-08-25 ENCOUNTER — Encounter: Payer: Self-pay | Admitting: Family Medicine

## 2017-08-25 ENCOUNTER — Ambulatory Visit (INDEPENDENT_AMBULATORY_CARE_PROVIDER_SITE_OTHER): Payer: Medicare HMO | Admitting: Family Medicine

## 2017-08-25 ENCOUNTER — Other Ambulatory Visit: Payer: Self-pay

## 2017-08-25 ENCOUNTER — Telehealth: Payer: Self-pay | Admitting: Family Medicine

## 2017-08-25 VITALS — BP 140/86 | HR 88 | Temp 97.7°F | Ht 73.0 in | Wt 205.0 lb

## 2017-08-25 DIAGNOSIS — E876 Hypokalemia: Secondary | ICD-10-CM | POA: Insufficient documentation

## 2017-08-25 DIAGNOSIS — R739 Hyperglycemia, unspecified: Secondary | ICD-10-CM

## 2017-08-25 DIAGNOSIS — E119 Type 2 diabetes mellitus without complications: Secondary | ICD-10-CM

## 2017-08-25 DIAGNOSIS — Z794 Long term (current) use of insulin: Secondary | ICD-10-CM | POA: Diagnosis not present

## 2017-08-25 DIAGNOSIS — R634 Abnormal weight loss: Secondary | ICD-10-CM

## 2017-08-25 DIAGNOSIS — Z87891 Personal history of nicotine dependence: Secondary | ICD-10-CM | POA: Diagnosis not present

## 2017-08-25 DIAGNOSIS — R631 Polydipsia: Secondary | ICD-10-CM

## 2017-08-25 DIAGNOSIS — Z79899 Other long term (current) drug therapy: Secondary | ICD-10-CM | POA: Diagnosis not present

## 2017-08-25 DIAGNOSIS — E871 Hypo-osmolality and hyponatremia: Secondary | ICD-10-CM | POA: Diagnosis not present

## 2017-08-25 DIAGNOSIS — E1110 Type 2 diabetes mellitus with ketoacidosis without coma: Principal | ICD-10-CM | POA: Diagnosis present

## 2017-08-25 DIAGNOSIS — R21 Rash and other nonspecific skin eruption: Secondary | ICD-10-CM | POA: Diagnosis not present

## 2017-08-25 DIAGNOSIS — G4700 Insomnia, unspecified: Secondary | ICD-10-CM | POA: Diagnosis not present

## 2017-08-25 DIAGNOSIS — K219 Gastro-esophageal reflux disease without esophagitis: Secondary | ICD-10-CM | POA: Diagnosis present

## 2017-08-25 DIAGNOSIS — R350 Frequency of micturition: Secondary | ICD-10-CM

## 2017-08-25 DIAGNOSIS — F319 Bipolar disorder, unspecified: Secondary | ICD-10-CM | POA: Diagnosis not present

## 2017-08-25 LAB — BASIC METABOLIC PANEL
ANION GAP: 13 (ref 5–15)
Anion gap: 16 — ABNORMAL HIGH (ref 5–15)
Anion gap: 15 (ref 5–15)
BUN: 13 mg/dL (ref 6–20)
BUN: 14 mg/dL (ref 6–20)
BUN: 13 mg/dL (ref 6–20)
CALCIUM: 9.4 mg/dL (ref 8.9–10.3)
CALCIUM: 8.3 mg/dL — AB (ref 8.9–10.3)
CHLORIDE: 94 mmol/L — AB (ref 101–111)
CHLORIDE: 100 mmol/L — AB (ref 101–111)
CO2: 16 mmol/L — AB (ref 22–32)
CO2: 18 mmol/L — AB (ref 22–32)
CO2: 14 mmol/L — AB (ref 22–32)
CREATININE: 1.21 mg/dL (ref 0.61–1.24)
Calcium: 8.5 mg/dL — ABNORMAL LOW (ref 8.9–10.3)
Chloride: 102 mmol/L (ref 101–111)
Creatinine, Ser: 1.02 mg/dL (ref 0.61–1.24)
Creatinine, Ser: 0.92 mg/dL (ref 0.61–1.24)
GFR calc Af Amer: >60 mL/min (ref >60)
GFR calc non Af Amer: >60 mL/min (ref >60)
GLUCOSE: 396 mg/dL — AB (ref 65–99)
Glucose, Bld: 582 mg/dL (ref 65–99)
Glucose, Bld: 289 mg/dL — ABNORMAL HIGH (ref 65–99)
POTASSIUM: 3.7 mmol/L (ref 3.5–5.1)
POTASSIUM: 3.7 mmol/L (ref 3.5–5.1)
Potassium: 3.9 mmol/L (ref 3.5–5.1)
SODIUM: 126 mmol/L — AB (ref 135–145)
Sodium: 131 mmol/L — ABNORMAL LOW (ref 135–145)
Sodium: 131 mmol/L — ABNORMAL LOW (ref 135–145)

## 2017-08-25 LAB — I-STAT VENOUS BLOOD GAS, ED
Acid-base deficit: 10 mmol/L — ABNORMAL HIGH (ref 0.0–2.0)
BICARBONATE: 14.1 mmol/L — AB (ref 20.0–28.0)
O2 Saturation: 85 %
PCO2 VEN: 25.9 mmHg — AB (ref 44.0–60.0)
PH VEN: 7.343 (ref 7.250–7.430)
PO2 VEN: 52 mmHg — AB (ref 32.0–45.0)
TCO2: 15 mmol/L — AB (ref 22–32)

## 2017-08-25 LAB — CBG MONITORING, ED: Glucose-Capillary: 326 mg/dL — ABNORMAL HIGH (ref 65–99)

## 2017-08-25 LAB — URINALYSIS, ROUTINE W REFLEX MICROSCOPIC
Bacteria, UA: NONE SEEN
Bilirubin Urine: NEGATIVE
Ketones, ur: 80 mg/dL — AB
Leukocytes, UA: NEGATIVE
Nitrite: NEGATIVE
PH: 5 (ref 5.0–8.0)
Protein, ur: NEGATIVE mg/dL
SPECIFIC GRAVITY, URINE: 1.031 — AB (ref 1.005–1.030)
SQUAMOUS EPITHELIAL / LPF: NONE SEEN

## 2017-08-25 LAB — CBC
HCT: 42.4 % (ref 39.0–52.0)
Hemoglobin: 15.4 g/dL (ref 13.0–17.0)
MCH: 31.9 pg (ref 26.0–34.0)
MCHC: 36.3 g/dL — ABNORMAL HIGH (ref 30.0–36.0)
MCV: 87.8 fL (ref 78.0–100.0)
PLATELETS: 237 10*3/uL (ref 150–400)
RBC: 4.83 MIL/uL (ref 4.22–5.81)
RDW: 12.3 % (ref 11.5–15.5)
WBC: 10.3 10*3/uL (ref 4.0–10.5)

## 2017-08-25 LAB — POCT GLUCOSE (DEVICE FOR HOME USE): POC Glucose: >600 mg/dl (ref 70–99)

## 2017-08-25 LAB — GLUCOSE, CAPILLARY
GLUCOSE-CAPILLARY: 337 mg/dL — AB (ref 65–99)
Glucose-Capillary: 293 mg/dL — ABNORMAL HIGH (ref 65–99)

## 2017-08-25 LAB — KETONES, URINE: Ketones, ur: 80 mg/dL — AB

## 2017-08-25 MED ORDER — INSULIN REGULAR HUMAN 100 UNIT/ML IJ SOLN
INTRAMUSCULAR | Status: DC
  Administered 2017-08-25: 2.3 [IU]/h via INTRAVENOUS
  Filled 2017-08-25 (×2): qty 1

## 2017-08-25 MED ORDER — SODIUM CHLORIDE 0.9 % IV SOLN
1.0000 g | Freq: Once | INTRAVENOUS | Status: AC
  Administered 2017-08-25: 1 g via INTRAVENOUS
  Filled 2017-08-25: qty 10

## 2017-08-25 MED ORDER — OXYCODONE-ACETAMINOPHEN 10-325 MG PO TABS: 1.0000 | ORAL_TABLET | Freq: Three times a day (TID) | ORAL | Status: DC | PRN

## 2017-08-25 MED ORDER — ONDANSETRON HCL 4 MG/2ML IJ SOLN: 4.0000 mg | Freq: Four times a day (QID) | INTRAMUSCULAR | Status: DC | PRN

## 2017-08-25 MED ORDER — ENOXAPARIN SODIUM 40 MG/0.4ML ~~LOC~~ SOLN
40.0000 mg | SUBCUTANEOUS | Status: DC
  Filled 2017-08-25: qty 0.4

## 2017-08-25 MED ORDER — DEXTROSE-NACL 5-0.45 % IV SOLN
INTRAVENOUS | Status: DC
  Administered 2017-08-26: 04:00:00 via INTRAVENOUS

## 2017-08-25 MED ORDER — POTASSIUM CHLORIDE 10 MEQ/100ML IV SOLN
10.0000 meq | INTRAVENOUS | Status: AC
  Administered 2017-08-25 (×2): 10 meq via INTRAVENOUS
  Filled 2017-08-25 (×2): qty 100

## 2017-08-25 MED ORDER — SODIUM CHLORIDE 0.9 % IV SOLN
INTRAVENOUS | Status: DC
  Administered 2017-08-25 – 2017-08-26 (×2): via INTRAVENOUS

## 2017-08-25 MED ORDER — SODIUM CHLORIDE 0.9 % IV BOLUS (SEPSIS)
2000.0000 mL | Freq: Once | INTRAVENOUS | Status: AC
  Administered 2017-08-25: 2000 mL via INTRAVENOUS

## 2017-08-25 MED ORDER — OXYCODONE HCL 5 MG PO TABS
5.0000 mg | ORAL_TABLET | Freq: Three times a day (TID) | ORAL | Status: DC | PRN
  Administered 2017-08-25 – 2017-08-26 (×4): 5 mg via ORAL
  Filled 2017-08-25 (×4): qty 1

## 2017-08-25 MED ORDER — PANTOPRAZOLE SODIUM 20 MG PO TBEC: 40.0000 mg | DELAYED_RELEASE_TABLET | Freq: Every day | ORAL | Status: DC | PRN

## 2017-08-25 MED ORDER — OXYCODONE-ACETAMINOPHEN 5-325 MG PO TABS
1.0000 | ORAL_TABLET | Freq: Three times a day (TID) | ORAL | Status: DC | PRN
  Administered 2017-08-25 – 2017-08-26 (×4): 1 via ORAL
  Filled 2017-08-25 (×4): qty 1

## 2017-08-25 MED ORDER — QUETIAPINE FUMARATE 50 MG PO TABS
50.0000 mg | ORAL_TABLET | Freq: Every day | ORAL | Status: DC
  Administered 2017-08-25: 50 mg via ORAL
  Filled 2017-08-25 (×3): qty 1

## 2017-08-25 MED ORDER — ONDANSETRON HCL 4 MG PO TABS: 4.0000 mg | ORAL_TABLET | Freq: Four times a day (QID) | ORAL | Status: DC | PRN

## 2017-08-25 NOTE — ED Triage Notes (Signed)
Pt. Stated, Antonio Lawson been drinking so much sweet tea and so thirrsty and went to [place] and they said I need to come here for high sugar.

## 2017-08-25 NOTE — ED Provider Notes (Signed)
[place] EMERGENCY DEPARTMENT Provider Note   CSN: [id number] Arrival date & time: 08/25/17  1238     History   Chief Complaint Chief Complaint  Patient presents with  . Hyperglycemia    HPI Antonio Lawson is a 56 y.o. male.  The history is provided by the patient and medical records.  Hyperglycemia  Blood sugar level PTA:  582 Severity:  Severe Onset quality:  Unable to specify Timing:  Constant Progression:  Unchanged Chronicity:  New Diabetes status:  Non-diabetic Context: new diabetes diagnosis   Relieved by:  None tried Ineffective treatments:  None tried Associated symptoms: dehydration, dizziness, increased thirst, polyuria and weight change   Associated symptoms: no abdominal pain, no chest pain, no dysuria, no fever, no nausea, no shortness of breath and no vomiting     Past Medical History:  Diagnosis Date  . Bipolar 1 disorder ([place])   . Chicken pox   . Diverticulitis   . Diverticulosis   . Fatty liver   . GERD (gastroesophageal reflux disease)   . Hyperplastic colon polyp   . Internal hemorrhoids     Patient Active Problem List   Diagnosis Date Noted  . Left foot pain 12/18/2016  . Hallux limitus of left foot 11/10/2016  . Osteoarthritis of spine with radiculopathy, lumbosacral region 11/09/2016  . Perianal irritation 06/07/2016  . Incontinence of feces 06/07/2016  . Arthralgia 06/01/2016  . Anxiety state 06/01/2016  . Hemorrhoids 03/28/2016  . GERD (gastroesophageal reflux disease) 03/28/2016  . Anal discharge 03/28/2016  . Sleep disturbance 03/28/2016  . Bipolar I disorder ([place]) 03/28/2016  . Overweight (BMI 25.0-29.9) 03/28/2016  . Tobacco use disorder 03/28/2016    Past Surgical History:  Procedure Laterality Date  . APPENDECTOMY    . HERNIA REPAIR         Home Medications    Prior to Admission medications   Medication Sig Start Date End Date Taking? Authorizing Provider  Esomeprazole Magnesium  (NEXIUM 24HR) 20 MG TBEC Take 20 mg by mouth daily as needed (for reflux symptoms).   Yes [provider]  oxyCODONE-acetaminophen (PERCOCET) 10-325 MG tablet Take 1 tablet by mouth every 8 (eight) hours as needed for pain. 08/20/17  Yes Sheard, Myeong O, DPM  QUEtiapine (SEROQUEL) 100 MG tablet Take 50 mg by mouth at bedtime.    Yes [provider]    Family History Family History  Problem Relation Age of Onset  . Pancreatic cancer Mother   . Heart attack Father   . Stroke Father   . Esophageal cancer Maternal Grandfather     Social History Social History   Tobacco Use  . Smoking status: Former Smoker    Packs/day: 1.00    Types: Cigarettes    Last attempt to quit: 08/18/2017    Years since quitting: 0.0  . Smokeless tobacco: Never Used  Substance Use Topics  . Alcohol use: No  . Drug use: No     Allergies   Patient has no known allergies.   Review of Systems Review of Systems  Constitutional: Positive for unexpected weight change. Negative for chills and fever.  HENT: Negative for rhinorrhea and sore throat.   Eyes: Negative for pain and visual disturbance.  Respiratory: Negative for cough and shortness of breath.   Cardiovascular: Negative for chest pain.  Gastrointestinal: Negative for abdominal pain, nausea and vomiting.  Endocrine: Positive for polydipsia and polyuria.  Genitourinary: Negative for dysuria and hematuria.  Musculoskeletal: Positive for arthralgias (foot  pain from recent surgery). Negative for back pain.  Skin: Negative for color change and rash.  Neurological: Positive for dizziness. Negative for seizures and syncope.  All other systems reviewed and are negative.    Physical Exam Updated Vital Signs BP 121/84 (BP Location: Right Arm)   Pulse 76   Temp 98.1 F (36.7 C) (Oral)   Resp 16   Ht 6\' 1"  (1.854 m)   Wt 93 kg (205 lb)   SpO2 96%   BMI 27.05 kg/m   Physical Exam  Constitutional: He is oriented to person,  place, and time. He appears well-developed and well-nourished.  HENT:  Head: Normocephalic and atraumatic.  Eyes: Conjunctivae are normal.  Neck: Neck supple.  Cardiovascular: Regular rhythm.  No murmur heard. Tachycardic  Pulmonary/Chest: Effort normal and breath sounds normal. No respiratory distress.  Abdominal: Soft. There is no tenderness.  Musculoskeletal: Normal range of motion. He exhibits no edema.  Neurological: He is alert and oriented to person, place, and time.  Skin: Skin is warm and dry.  Psychiatric: He has a normal mood and affect.  Nursing note and vitals reviewed.    ED Treatments / Results  Labs (all labs ordered are listed, but only abnormal results are displayed) Labs Reviewed  BASIC METABOLIC PANEL - Abnormal; Notable for the following components:      Result Value   Sodium 126 (*)    Chloride 94 (*)    CO2 16 (*)    Glucose, Bld 582 (*)    Anion gap 16 (*)    All other components within normal limits  CBC - Abnormal; Notable for the following components:   MCHC 36.3 (*)    All other components within normal limits  URINALYSIS, ROUTINE W REFLEX MICROSCOPIC - Abnormal; Notable for the following components:   Color, Urine STRAW (*)    Specific Gravity, Urine 1.031 (*)    Glucose, UA >=500 (*)    Hgb urine dipstick SMALL (*)    Ketones, ur 80 (*)    All other components within normal limits  KETONES, URINE - Abnormal; Notable for the following components:   Ketones, ur 80 (*)    All other components within normal limits  BASIC METABOLIC PANEL - Abnormal; Notable for the following components:   Sodium 131 (*)    Chloride 100 (*)    CO2 18 (*)    Glucose, Bld 396 (*)    Calcium 8.5 (*)    All other components within normal limits  CBG MONITORING, ED - Abnormal; Notable for the following components:   Glucose-Capillary 326 (*)    All other components within normal limits  I-STAT VENOUS BLOOD GAS, ED - Abnormal; Notable for the following  components:   pCO2, Ven 25.9 (*)    pO2, Ven 52.0 (*)    Bicarbonate 14.1 (*)    TCO2 15 (*)    Acid-base deficit 10.0 (*)    All other components within normal limits  BLOOD GAS, VENOUS    EKG  EKG Interpretation None       Radiology No results found.  Procedures Procedures (including critical care time)  Medications Ordered in ED Medications  sodium chloride 0.9 % bolus 2,000 mL (0 mLs Intravenous Stopped 08/25/17 1654)     Initial Impression / Assessment and Plan / ED Course  I have reviewed the triage vital signs and the nursing notes.  Pertinent labs & imaging results that were available during my care of the patient were  reviewed by me and considered in my medical decision making (see chart for details).     Pt presents with new-onset DM. Went to PCP and complained of several days of dry mouth, polydipsia, & polyuria; FSBG at the office was >600, so Pt was told to come to the ED. Says he had bunionectomy in October for which he's been taking Percocet which it what he thought was causing his symptoms; Pt did receive a cortisone injection to the left first MPJ on 12/17 for persistent pain. Denies F/C, HA, cough, CP, SOB, N/V/D.  VS & exam as above. NS bolus ordered on arrival. Labs remarkable for glucose 582, HCO3 16, Crt 1.21, AG 16. VBG: 7.3 / 25.9 / 52 / 15. UA w/glucose and ketones.  Repeat labs after IVF: glucose 396, HCO3 18, AG 13.  Will admit the Pt to medicine for further evaluation and treatment.  Final Clinical Impressions(s) / ED Diagnoses   Final diagnoses:  Diabetes mellitus, new onset [facility])    ED Discharge Orders    None       Jenny Reichmann, MD 08/25/17 Remus Loffler    Elnora Morrison, MD 08/26/17 [phone]

## 2017-08-25 NOTE — Progress Notes (Signed)
Subjective:    Patient ID: Antonio Lawson, male    DOB: [birth year], 56 y.o.   MRN: [id number]  HPI This is a 56 yo male who presents today with multiple complaints. He has had a dry mouth for a couple of days. Has been taking pain medication for recent foot surgery and has been taking pain medication- dry mouth has occurred since starting pain medication. Is very thirsty and drinking a lot. Urinating frequently.  Has rash on his arms for about a week. Noticed after watching a friend's dog. Has applied peroxide.   Past Medical History:  Diagnosis Date  . Bipolar 1 disorder ([place])   . Chicken pox   . Diverticulitis   . Diverticulosis   . Fatty liver   . GERD (gastroesophageal reflux disease)   . Hyperplastic colon polyp   . Internal hemorrhoids    Past Surgical History:  Procedure Laterality Date  . APPENDECTOMY    . HERNIA REPAIR     Family History  Problem Relation Age of Onset  . Pancreatic cancer Mother   . Heart attack Father   . Stroke Father   . Esophageal cancer Maternal Grandfather    Social History   Tobacco Use  . Smoking status: Current Every Day Smoker    Packs/day: 1.00    Types: Cigarettes  . Smokeless tobacco: Never Used  Substance Use Topics  . Alcohol use: No  . Drug use: No      Review of Systems  Constitutional: Positive for fatigue and unexpected weight change.  Endocrine: Positive for polydipsia and polyuria.  Skin: Positive for rash (bilateral arms).       Objective:   Physical Exam  Constitutional: He is oriented to person, place, and time. He appears well-developed and well-nourished. No distress.  HENT:  Head: Normocephalic and atraumatic.  Mouth/Throat: Oropharynx is clear and moist.  Eyes: Conjunctivae are normal.  Cardiovascular: Normal rate, regular rhythm and normal heart sounds.  Pulmonary/Chest: Effort normal and breath sounds normal.  Musculoskeletal: He exhibits no edema.  Neurological: He is alert and oriented to  person, place, and time.  Skin: Rash (resolving on bilateral arms, areas of scabbing, no erythema or drainage) noted. He is not diaphoretic.  Psychiatric: He has a normal mood and affect. His behavior is normal. Judgment and thought content normal.   Physical Exam  Constitutional: Oriented to person, place, and time. He appears well-developed and well-nourished.  HENT:  Head: Normocephalic and atraumatic.  Eyes: Conjunctivae are normal.  Neck: Normal range of motion. Neck supple.  Cardiovascular: Normal rate, regular rhythm and normal heart sounds.   Pulmonary/Chest: Effort normal and breath sounds normal.  Musculoskeletal: Normal range of motion. No edema.  Neurological: Alert and oriented to person, place, and time.  Skin: Skin is warm and dry.  Psychiatric: Normal mood and affect. Behavior is normal. Judgment and thought content normal.  Vitals reviewed.       BP 140/86 (BP Location: Right Arm, Patient Position: Sitting, Cuff Size: Normal)   Pulse 88   Temp 97.7 F (36.5 C) (Oral)   Ht 6\' 1"  (1.854 m)   Wt 205 lb (93 kg)   SpO2 97%   BMI 27.05 kg/m  Wt Readings from Last 3 Encounters:  08/25/17 205 lb (93 kg)  04/18/17 227 lb (103 kg)  04/10/17 228 lb (103.4 kg)   POCT glucose- above 600  Assessment & Plan:  1. Hyperglycemia - glucose greater than 600 on office glucometer, discussed with patient  who was sent across the street to [facility] - Called triage nurse at [facility] to provide report  2. Weight loss - POCT Glucose (Device for Home Use)  3. Urinary frequency - POCT Glucose (Device for Home Use)  4. Polydipsia - POCT Glucose (Device for Home Use)  5. Rash - resolving, encouraged him to keep clean and dry and avoid scratching  Clarene Reamer, FNP-BC  [facility], [place]  08/25/2017 10:09 AM

## 2017-08-25 NOTE — ED Notes (Signed)
Called no answer

## 2017-08-25 NOTE — Patient Instructions (Signed)
Please go to the [facility] now

## 2017-08-25 NOTE — H&P (Signed)
History and Physical    Antonio Lawson JJO:[id number] DOB: 03-01-1961 DOA: 08/25/2017  Referring MD/NP/PA: Dr. Elnora Morrison PCP: Hoyt Koch, MD  Patient coming from: PCP clinic  Chief Complaint: Elevated blood sugar  I have personally briefly reviewed patient's old medical records in [place]   HPI: Antonio Lawson is a 56 y.o. male with medical history significant of bipolar 1 disorder, GERD, and H/O diverticulosis/diverticulitis; who presents with complaints of elevated blood sugar.  For the last 2-3 days he has had complaints of dry mouth, increased thirst, polydipsia, and polyuria.  He reported drinking large amounts of fluid including water, sweet tea, and Gatorade.  Notes at baseline that he has a sweet tooth, but denies any family history of diabetes to his knowledge.  He reports being on Seroquel for insomnia and has been taking this medicine for 1 year now.  However, notes that he is in the process of trying to wean himself off of this medication at this time.  Other associated symptoms include weight loss possibly of 20 pounds, and Complaints of a rash on his arm for about a week after sitting a friend's pet that appears to be healing.  He had been trying to treat it with peroxide.  ED Course: Upon admission into the emergency department patient was seen to be afebrile, pulse 76-109, respirations 16-20, blood pressure 121/84-148/98, and O2 saturations 90-99% on room air.  Labs revealed sodium 126, potassium 3.9, chloride 94, CO2 16, BUN 13, creatinine 1.21, glucose 582, anion gap 16, and venous pH 7.343.  Patient was given 2 L of normal saline IV fluids and repeat labs revealed anion gap 13, and CBG 396.  Review of Systems  Constitutional: Positive for weight loss. Negative for chills, fever and malaise/fatigue.  HENT: Negative for ear discharge and nosebleeds.   Eyes: Negative for double vision and photophobia.  Respiratory: Negative for cough and shortness of  breath.   Cardiovascular: Negative for chest pain and orthopnea.  Gastrointestinal: Positive for nausea. Negative for abdominal pain and vomiting.  Genitourinary: Positive for frequency. Negative for dysuria.  Musculoskeletal: Negative for falls and joint pain.  Skin: Positive for rash. Negative for itching.  Neurological: Negative for seizures and loss of consciousness.  Endo/Heme/Allergies: Positive for polydipsia.  Psychiatric/Behavioral: Negative for substance abuse. The patient is not nervous/anxious.     Past Medical History:  Diagnosis Date  . Bipolar 1 disorder ([place])   . Chicken pox   . Diverticulitis   . Diverticulosis   . Fatty liver   . GERD (gastroesophageal reflux disease)   . Hyperplastic colon polyp   . Internal hemorrhoids     Past Surgical History:  Procedure Laterality Date  . APPENDECTOMY    . HERNIA REPAIR       reports that he quit smoking 7 days ago. His smoking use included cigarettes. He smoked 1.00 pack per day. he has never used smokeless tobacco. He reports that he does not drink alcohol or use drugs.  No Known Allergies  Family History  Problem Relation Age of Onset  . Pancreatic cancer Mother   . Heart attack Father   . Stroke Father   . Esophageal cancer Maternal Grandfather     Prior to Admission medications   Medication Sig Start Date End Date Taking? Authorizing Provider  esomeprazole (NEXIUM) 20 MG capsule Take 20 mg by mouth daily at 12 noon. OTC medication    [provider]  oxyCODONE-acetaminophen (PERCOCET) 10-325 MG tablet Take 1  tablet by mouth every 8 (eight) hours as needed for pain. 08/20/17   Sheard, Myeong O, DPM  QUEtiapine (SEROQUEL) 100 MG tablet Take 100 mg by mouth at bedtime.    [provider]    Physical Exam:  Constitutional: NAD, calm, comfortable Vitals:   08/25/17 1326 08/25/17 1327 08/25/17 1430 08/25/17 1530  BP:  (!) 121/94 (!) 148/98 (!) 132/98  Pulse:  97 (!) 109 90  Resp:  17 20     Temp:  98.1 F (36.7 C) 98.1 F (36.7 C)   TempSrc:  Oral Oral   SpO2:  98% 99% 90%  Weight: 93 kg (205 lb)     Height: 6\' 1"  (1.854 m)      Eyes: PERRL, lids and conjunctivae normal ENMT: Mucous membranes are moist. Posterior pharynx clear of any exudate or lesions.Normal dentition.  Neck: normal, supple, no masses, no thyromegaly Respiratory: clear to auscultation bilaterally, no wheezing, no crackles. Normal respiratory effort. No accessory muscle use.  Cardiovascular: Regular rate and rhythm, no murmurs / rubs / gallops. No extremity edema. 2+ pedal pulses. No carotid bruits.  Abdomen: no tenderness, no masses palpated. No hepatosplenomegaly. Bowel sounds positive.  Musculoskeletal: no clubbing / cyanosis. No joint deformity upper and lower extremities. Good ROM, no contractures. Normal muscle tone.  Skin: Rash her extremities appears to be improving Neurologic: CN 2-12 grossly intact. Sensation intact, DTR normal. Strength 5/5 in all 4.  Psychiatric: Normal judgment and insight. Alert and oriented x 3. Normal mood.     Labs on Admission: I have personally reviewed following labs and imaging studies  CBC: Recent Labs  Lab 08/25/17 1334  WBC 10.3  HGB 15.4  HCT 42.4  MCV 87.8  PLT 517   Basic Metabolic Panel: Recent Labs  Lab 08/25/17 1334 08/25/17 1703  NA 126* 131*  K 3.9 3.7  CL 94* 100*  CO2 16* 18*  GLUCOSE 582* 396*  BUN 13 14  CREATININE 1.21 1.02  CALCIUM 9.4 8.5*   GFR: Estimated Creatinine Clearance: 93.6 mL/min (by C-G formula based on SCr of 1.02 mg/dL). Liver Function Tests: No results for input(s): AST, ALT, ALKPHOS, BILITOT, PROT, ALBUMIN in the last 168 hours. No results for input(s): LIPASE, AMYLASE in the last 168 hours. No results for input(s): AMMONIA in the last 168 hours. Coagulation Profile: No results for input(s): INR, PROTIME in the last 168 hours. Cardiac Enzymes: No results for input(s): CKTOTAL, CKMB, CKMBINDEX, TROPONINI in  the last 168 hours. BNP (last 3 results) No results for input(s): PROBNP in the last 8760 hours. HbA1C: No results for input(s): HGBA1C in the last 72 hours. CBG: No results for input(s): GLUCAP in the last 168 hours. Lipid Profile: No results for input(s): CHOL, HDL, LDLCALC, TRIG, CHOLHDL, LDLDIRECT in the last 72 hours. Thyroid Function Tests: No results for input(s): TSH, T4TOTAL, FREET4, T3FREE, THYROIDAB in the last 72 hours. Anemia Panel: No results for input(s): VITAMINB12, FOLATE, FERRITIN, TIBC, IRON, RETICCTPCT in the last 72 hours. Urine analysis:    Component Value Date/Time   COLORURINE STRAW (A) 08/25/2017 1650   APPEARANCEUR CLEAR 08/25/2017 1650   LABSPEC 1.031 (H) 08/25/2017 1650   PHURINE 5.0 08/25/2017 1650   GLUCOSEU >=500 (A) 08/25/2017 1650   HGBUR SMALL (A) 08/25/2017 1650   BILIRUBINUR NEGATIVE 08/25/2017 1650   KETONESUR 80 (A) 08/25/2017 1650   KETONESUR 80 (A) 08/25/2017 1650   PROTEINUR NEGATIVE 08/25/2017 1650   NITRITE NEGATIVE 08/25/2017 1650   LEUKOCYTESUR NEGATIVE 08/25/2017  1650   Sepsis Labs: No results found for this or any previous visit (from the past 240 hour(s)).   Radiological Exams on Admission: No results found.  CXR: Independently reviewed.  No acute cardiopulmonary abnormality.  Assessment/Plan DKA, type II: Acute.  Patient presents with complaints of thirst, polyuria, and polydipsia.  Initial blood glucose 582 with CO2 16 and elevated anion gap of 16. Urinalysis positive for ketones and has concentrated specific gravity.  No previous family history/personal of diabetes.   - Admit to stepdown unit  - Glucose stabilizer protocol initiated  - CBGs q. one hour - Normal saline IV fluids at 125 mL/h - Change to D5 0.45-normal saline with CBG less than 250 - Serial BMPs, hemoglobin A1c in a.m.  - Correct electrolytes as needed - Monitoring for AG closure and will transition to subcutaneous insulin once able - Diabetes education  for possible need of   Hypocalcemia: Acute recheck BMP and noted calcium 8.5. - Give 1 g of calcium gluconate  Hyponatremia:  Initial sodium noted to be 126 on admission and when accounting for hyperglycemia noted to be 138 - Continue to monitor as correcting hyperglycemia  Bipolar 1 disorder/insomnia: Patient currently on Seroquel, but reports being in the process of trying to wean himself off this medication. Side effect of this medication includes diabetes. - Continue Seroquel  Rash - Continue to monitor   GERD - Nexium as needed  DVT prophylaxis: lovenox   Code Status: full Family Communication: No family present. Disposition Plan: TBD Consults called: none  Admission status: Observation  Norval Morton MD [facility] Pager [phone]   If 7PM-7AM, please contact night-coverage [email or web address] Password [facility]  08/25/2017, 7:11 PM

## 2017-08-25 NOTE — ED Notes (Addendum)
Glucose 582 called from labRalene Bathe MD notified

## 2017-08-25 NOTE — Telephone Encounter (Signed)
Patient advised to go to ER earlier today and it does not appear he has checked in. Attempted to call on both listed numbers to check on him. Both have voicemail with different name than patient, so did not leave message.

## 2017-08-26 ENCOUNTER — Other Ambulatory Visit: Payer: Self-pay

## 2017-08-26 DIAGNOSIS — E876 Hypokalemia: Secondary | ICD-10-CM | POA: Diagnosis not present

## 2017-08-26 DIAGNOSIS — E1110 Type 2 diabetes mellitus with ketoacidosis without coma: Principal | ICD-10-CM

## 2017-08-26 DIAGNOSIS — E119 Type 2 diabetes mellitus without complications: Secondary | ICD-10-CM

## 2017-08-26 DIAGNOSIS — K219 Gastro-esophageal reflux disease without esophagitis: Secondary | ICD-10-CM

## 2017-08-26 DIAGNOSIS — R739 Hyperglycemia, unspecified: Secondary | ICD-10-CM

## 2017-08-26 DIAGNOSIS — F319 Bipolar disorder, unspecified: Secondary | ICD-10-CM

## 2017-08-26 LAB — GLUCOSE, CAPILLARY
GLUCOSE-CAPILLARY: 126 mg/dL — AB (ref 65–99)
GLUCOSE-CAPILLARY: 151 mg/dL — AB (ref 65–99)
GLUCOSE-CAPILLARY: 203 mg/dL — AB (ref 65–99)
GLUCOSE-CAPILLARY: 306 mg/dL — AB (ref 65–99)
GLUCOSE-CAPILLARY: 310 mg/dL — AB (ref 65–99)
GLUCOSE-CAPILLARY: 317 mg/dL — AB (ref 65–99)
GLUCOSE-CAPILLARY: 318 mg/dL — AB (ref 65–99)
GLUCOSE-CAPILLARY: 331 mg/dL — AB (ref 65–99)
Glucose-Capillary: 327 mg/dL — ABNORMAL HIGH (ref 65–99)
Glucose-Capillary: 263 mg/dL — ABNORMAL HIGH (ref 65–99)
Glucose-Capillary: 123 mg/dL — ABNORMAL HIGH (ref 65–99)

## 2017-08-26 LAB — BASIC METABOLIC PANEL
ANION GAP: 6 (ref 5–15)
Anion gap: 5 (ref 5–15)
Anion gap: 5 (ref 5–15)
Anion gap: 6 (ref 5–15)
BUN: 13 mg/dL (ref 6–20)
BUN: 12 mg/dL (ref 6–20)
BUN: 12 mg/dL (ref 6–20)
BUN: 12 mg/dL (ref 6–20)
CO2: 18 mmol/L — ABNORMAL LOW (ref 22–32)
CO2: 20 mmol/L — ABNORMAL LOW (ref 22–32)
CO2: 21 mmol/L — ABNORMAL LOW (ref 22–32)
CO2: 19 mmol/L — ABNORMAL LOW (ref 22–32)
Calcium: 8.2 mg/dL — ABNORMAL LOW (ref 8.9–10.3)
Calcium: 8.2 mg/dL — ABNORMAL LOW (ref 8.9–10.3)
Calcium: 8.2 mg/dL — ABNORMAL LOW (ref 8.9–10.3)
Calcium: 8.4 mg/dL — ABNORMAL LOW (ref 8.9–10.3)
Chloride: 108 mmol/L (ref 101–111)
Chloride: 109 mmol/L (ref 101–111)
Chloride: 104 mmol/L (ref 101–111)
Chloride: 104 mmol/L (ref 101–111)
Creatinine, Ser: 0.78 mg/dL (ref 0.61–1.24)
Creatinine, Ser: 0.74 mg/dL (ref 0.61–1.24)
Creatinine, Ser: 0.79 mg/dL (ref 0.61–1.24)
Creatinine, Ser: 0.88 mg/dL (ref 0.61–1.24)
GFR calc Af Amer: >60 mL/min (ref >60)
GFR calc Af Amer: >60 mL/min (ref >60)
GFR calc Af Amer: >60 mL/min (ref >60)
GFR calc Af Amer: >60 mL/min (ref >60)
GFR calc non Af Amer: >60 mL/min (ref >60)
GFR calc non Af Amer: >60 mL/min (ref >60)
GFR calc non Af Amer: >60 mL/min (ref >60)
GLUCOSE: 255 mg/dL — AB (ref 65–99)
Glucose, Bld: 140 mg/dL — ABNORMAL HIGH (ref 65–99)
Glucose, Bld: 326 mg/dL — ABNORMAL HIGH (ref 65–99)
Glucose, Bld: 354 mg/dL — ABNORMAL HIGH (ref 65–99)
POTASSIUM: 3.2 mmol/L — AB (ref 3.5–5.1)
Potassium: 2.9 mmol/L — ABNORMAL LOW (ref 3.5–5.1)
Potassium: 3.7 mmol/L (ref 3.5–5.1)
Potassium: 3.7 mmol/L (ref 3.5–5.1)
SODIUM: 132 mmol/L — AB (ref 135–145)
Sodium: 134 mmol/L — ABNORMAL LOW (ref 135–145)
Sodium: 130 mmol/L — ABNORMAL LOW (ref 135–145)
Sodium: 129 mmol/L — ABNORMAL LOW (ref 135–145)

## 2017-08-26 LAB — CBC
HEMATOCRIT: 35 % — AB (ref 39.0–52.0)
HEMOGLOBIN: 12.6 g/dL — AB (ref 13.0–17.0)
MCH: 31.6 pg (ref 26.0–34.0)
MCHC: 36 g/dL (ref 30.0–36.0)
MCV: 87.7 fL (ref 78.0–100.0)
Platelets: 185 10*3/uL (ref 150–400)
RBC: 3.99 MIL/uL — AB (ref 4.22–5.81)
RDW: 12.4 % (ref 11.5–15.5)
WBC: 6.4 10*3/uL (ref 4.0–10.5)

## 2017-08-26 LAB — HIV ANTIBODY (ROUTINE TESTING W REFLEX): HIV SCREEN 4TH GENERATION: NONREACTIVE

## 2017-08-26 LAB — TSH: TSH: 3.045 u[IU]/mL (ref 0.350–4.500)

## 2017-08-26 LAB — MRSA PCR SCREENING: MRSA BY PCR: NEGATIVE

## 2017-08-26 LAB — HEMOGLOBIN A1C
HEMOGLOBIN A1C: 14.5 % — AB (ref 4.8–5.6)
Mean Plasma Glucose: 369.45 mg/dL

## 2017-08-26 MED ORDER — LIVING WELL WITH DIABETES BOOK
Freq: Once | Status: DC
  Filled 2017-08-26: qty 1

## 2017-08-26 MED ORDER — POTASSIUM CHLORIDE 10 MEQ/100ML IV SOLN
10.0000 meq | INTRAVENOUS | Status: AC
  Administered 2017-08-26: 10 meq via INTRAVENOUS
  Filled 2017-08-26 (×2): qty 100

## 2017-08-26 MED ORDER — INSULIN GLARGINE 100 UNIT/ML ~~LOC~~ SOLN
15.0000 [IU] | Freq: Every day | SUBCUTANEOUS | Status: DC
  Administered 2017-08-26: 15 [IU] via SUBCUTANEOUS
  Filled 2017-08-26 (×3): qty 0.15

## 2017-08-26 MED ORDER — INSULIN ASPART 100 UNIT/ML ~~LOC~~ SOLN
0.0000 [IU] | SUBCUTANEOUS | Status: DC
  Administered 2017-08-26 (×2): 11 [IU] via SUBCUTANEOUS
  Administered 2017-08-26: 8 [IU] via SUBCUTANEOUS
  Administered 2017-08-27: 11 [IU] via SUBCUTANEOUS
  Administered 2017-08-27: 3 [IU] via SUBCUTANEOUS

## 2017-08-26 MED ORDER — INSULIN ASPART 100 UNIT/ML ~~LOC~~ SOLN
5.0000 [IU] | Freq: Three times a day (TID) | SUBCUTANEOUS | Status: DC
  Administered 2017-08-26: 5 [IU] via SUBCUTANEOUS

## 2017-08-26 MED ORDER — POTASSIUM CHLORIDE CRYS ER 20 MEQ PO TBCR
40.0000 meq | EXTENDED_RELEASE_TABLET | Freq: Once | ORAL | Status: AC
  Administered 2017-08-26: 40 meq via ORAL
  Filled 2017-08-26: qty 2

## 2017-08-26 NOTE — Progress Notes (Signed)
PROGRESS NOTE  Antonio Lawson ION:[id number] DOB: [birth year] DOA: 08/25/2017 PCP: Hoyt Koch, MD  HPI/Recap of past 24 hours: HPI from Dr Fuller Plan on 08/25/17 Antonio Lawson is a 56 y.o. male with medical history significant of bipolar 1 disorder, GERD, and H/O diverticulosis/diverticulitis; who presents with complaints of elevated blood sugar.  For the last 2-3 days he has had complaints of dry mouth, increased thirst, polydipsia, and polyuria.  He reported drinking large amounts of fluid including water, sweet tea, and Gatorade.  Notes at baseline that he has a sweet tooth, but denies any family history of diabetes to his knowledge.  He reports being on Seroquel for insomnia and has been taking this medicine for 1 year now.  However, notes that he is in the process of trying to wean himself off of this medication at this time.  Other associated symptoms include weight loss possibly of 20 pounds. In the ED, glucose noted to be 582, anion gap 16, and venous pH 7.343.  Patient was given 2 L of normal saline IV fluids and repeat labs revealed anion gap 13, and CBG 396. Pt admitted for further management  Today, pt noted to be feeling "ok", denied any chest pain, SOB, abdominal pain, fever/chills, N/V/D/C. Pt concerned about cutting down sweets and sugary drinks. Pt was counseled extensively by writer on the need for lifestyle modification and medication adherence.  Assessment/Plan: Principal Problem:   DKA, type 2 ([place]) Active Problems:   GERD (gastroesophageal reflux disease)   Bipolar I disorder (H[facility])   Hypocalcemia  #New onset DM with DKA Resolving, A1c 14.5 New onset DM with BG 582, CO2 16 and elevated anion gap of 16, +ketones in U/A - S/P Glucose stabilizer protocol initiated, d/c on 12/23 after closed gap - Start [place] glargine 15U, aspart 5U TID, SSI with Q4H CBG, serial BMP - Normal saline IV fluids at 169mL/h - Diabetes education/Nutrition - Plan to d/c with  insulin, with close follow up with PCP   #Hyponatremia Resolving  Initial sodium noted to be 126 on admission, likely due to hyperglycemia Daily BMP  #Hypokalemia Likely due to insulin use in the setting of hyperglycemia Replace prn  #Bipolar 1 disorder/insomnia Patient currently on Seroquel (side effect includes diabetes) No HI/SI, refuses to be seen by mental health for other meds options, PCP to follow and refer pt on the outside Continue Seroquel for now   #GERD - Nexium as needed   Code Status: Full  Family Communication: None at bedside  Disposition Plan: Likely home   Consultants:  None  Procedures:  None  Antimicrobials:  None  DVT prophylaxis: Lovenox   Objective: Vitals:   08/25/17 2200 08/25/17 2320 08/26/17 0401 08/26/17 0823  BP: 118/80 111/81 94/62 105/74  Pulse: 71 78 64 68  Resp: 10 13 18 15   Temp: 97.6 F (36.4 C) 97.6 F (36.4 C) 98 F (36.7 C) 98 F (36.7 C)  TempSrc: Oral Oral Oral Oral  SpO2: 97% 96% 95% 95%  Weight:      Height:        Intake/Output Summary (Last 24 hours) at 08/26/2017 1128 Last data filed at 08/26/2017 0858 Gross per 24 hour  Intake 6323.39 ml  Output 1300 ml  Net 5023.39 ml   Filed Weights   08/25/17 1326  Weight: 93 kg (205 lb)    Exam:   General: Alert awake oriented x3  Cardiovascular: S1-S2 present, no added heart sound  Respiratory: Chest clear bilaterally  Abdomen:  Soft, nontender, nondistended, bowel sounds present  Musculoskeletal: No bilateral pedal edema  Skin: Normal  Psychiatry: Normal mood   Data Reviewed: CBC: Recent Labs  Lab 08/25/17 1334 08/26/17 0248  WBC 10.3 6.4  HGB 15.4 12.6*  HCT 42.4 35.0*  MCV 87.8 87.7  PLT 237 366   Basic Metabolic Panel: Recent Labs  Lab 08/25/17 1334 08/25/17 1703 08/25/17 2147 08/26/17 0248 08/26/17 0555  NA 126* 131* 131* 132* 134*  K 3.9 3.7 3.7 3.2* 2.9*  CL 94* 100* 102 108 109  CO2 16* 18* 14* 18* 20*  GLUCOSE  582* 396* 289* 255* 140*  BUN 13 14 13 13 12   CREATININE 1.21 1.02 0.92 0.78 0.74  CALCIUM 9.4 8.5* 8.3* 8.2* 8.2*   GFR: Estimated Creatinine Clearance: 119.3 mL/min (by C-G formula based on SCr of 0.74 mg/dL). Liver Function Tests: No results for input(s): AST, ALT, ALKPHOS, BILITOT, PROT, ALBUMIN in the last 168 hours. No results for input(s): LIPASE, AMYLASE in the last 168 hours. No results for input(s): AMMONIA in the last 168 hours. Coagulation Profile: No results for input(s): INR, PROTIME in the last 168 hours. Cardiac Enzymes: No results for input(s): CKTOTAL, CKMB, CKMBINDEX, TROPONINI in the last 168 hours. BNP (last 3 results) No results for input(s): PROBNP in the last 8760 hours. HbA1C: Recent Labs    08/26/17 0248  HGBA1C 14.5*   CBG: Recent Labs  Lab 08/26/17 0241 08/26/17 0338 08/26/17 0529 08/26/17 0628 08/26/17 0822  GLUCAP 263* 203* 151* 126* 123*   Lipid Profile: No results for input(s): CHOL, HDL, LDLCALC, TRIG, CHOLHDL, LDLDIRECT in the last 72 hours. Thyroid Function Tests: Recent Labs    08/26/17 0248  TSH 3.045   Anemia Panel: No results for input(s): VITAMINB12, FOLATE, FERRITIN, TIBC, IRON, RETICCTPCT in the last 72 hours. Urine analysis:    Component Value Date/Time   COLORURINE STRAW (A) 08/25/2017 1650   APPEARANCEUR CLEAR 08/25/2017 1650   LABSPEC 1.031 (H) 08/25/2017 1650   PHURINE 5.0 08/25/2017 1650   GLUCOSEU >=500 (A) 08/25/2017 1650   HGBUR SMALL (A) 08/25/2017 1650   BILIRUBINUR NEGATIVE 08/25/2017 1650   KETONESUR 80 (A) 08/25/2017 1650   KETONESUR 80 (A) 08/25/2017 1650   PROTEINUR NEGATIVE 08/25/2017 1650   NITRITE NEGATIVE 08/25/2017 1650   LEUKOCYTESUR NEGATIVE 08/25/2017 1650   Sepsis Labs: @LABRCNTIP (procalcitonin:4,lacticidven:4)  ) Recent Results (from the past 240 hour(s))  MRSA PCR Screening     Status: None   Collection Time: 08/26/17  6:42 AM  Result Value Ref Range Status   MRSA by PCR NEGATIVE  NEGATIVE Final    Comment:        The GeneXpert MRSA Assay (FDA approved for NASAL specimens only), is one component of a comprehensive MRSA colonization surveillance program. It is not intended to diagnose MRSA infection nor to guide or monitor treatment for MRSA infections.       Studies: Portable Chest X-ray (1 View)  Result Date: 08/25/2017 CLINICAL DATA:  Hyperglycemia. EXAM: PORTABLE CHEST 1 VIEW COMPARISON:  07/26/2015 FINDINGS: Lungs are adequately inflated without focal airspace consolidation or effusion. Cardiomediastinal silhouette and remainder of the exam is unchanged. IMPRESSION: No active disease. Electronically Signed   By: Marin Olp M.D.   On: 08/25/2017 20:02    Scheduled Meds: . enoxaparin (LOVENOX) injection  40 mg Subcutaneous Q24H  . insulin aspart  0-15 Units Subcutaneous Q4H  . insulin aspart  5 Units Subcutaneous TID WC  . insulin glargine  15 Units Subcutaneous  Daily  . living well with diabetes book   Does not apply Once  . potassium chloride  40 mEq Oral Once  . QUEtiapine  50 mg Oral QHS    Continuous Infusions: . sodium chloride 125 mL/hr at 08/26/17 0530  . dextrose 5 % and 0.45% NaCl Stopped (08/26/17 0858)     LOS: 0 days     Alma Friendly, MD [facility]   If 7PM-7AM, please contact night-coverage [email or web address] Password [facility] 08/26/2017, 11:28 AM

## 2017-08-26 NOTE — Progress Notes (Signed)
Inpatient Diabetes Program Recommendations  AACE/ADA: New Consensus Statement on Inpatient Glycemic Control (2015)  Target Ranges:  Prepandial:   less than 140 mg/dL      Peak postprandial:   less than 180 mg/dL (1-2 hours)      Critically ill patients:  140 - 180 mg/dL   Results for Antonio Lawson, Antonio Lawson (MRN [id number]) as of 08/26/2017 08:45  Ref. Range 08/26/2017 02:48  Hemoglobin A1C Latest Ref Range: 4.8 - 5.6 % 14.5 (H)   Results for Antonio Lawson, Antonio Lawson (MRN [id number]) as of 08/26/2017 08:45  Ref. Range 08/25/2017 13:34  Glucose Latest Ref Range: 65 - 99 mg/dL 582 (HH)    Admit with: DKA/ New Diagnosis of DM   Current Insulin Orders: IV Insulin Drip with transition plan to Lantus and Novolog today      Lantus 15 units daily      Novolog Moderate Correction Scale/ SSI (0-15 units) Q4 hours      Novolog 5 units TID       Note patient with new diagnosis of DM.  BMET reveals CO2 and Anion Gap back to normal this AM.  Note plan to transition to SQ insulin this AM as well.  DM Coordinator not present on campus on the weekends, however, educational materials will be ordered for patient and will ask nursing staff to begin basic diabetes survival skills education with patient including CBG meter teaching, insulin education, sick day rules, etc.    Will also order RD consult for diet education and Outpatient DM Education so patient can follow-up with a Certified Diabetes Educator for additional education after discharge.  This DM Coordinator plans to call patient by phone today to discuss new diagnosis as well.      --Will follow patient during hospitalization--  Wyn Quaker RN, MSN, CDE Diabetes Coordinator Inpatient Glycemic Control Team Team Pager: [phone] (8a-5p)

## 2017-08-26 NOTE — Care Management Note (Addendum)
Case Management Note  Patient Details  Name: Antonio Lawson MRN: [id number] Date of Birth: [birth year]  Subjective/Objective:  Pt is a 56 yo M who has [employer] with secondary Medicaid and would have co-pay for all medications. Would also be eligible for GoodRx. CM eill be able to more effective;y assist when specific needs/meds are known                   Action/Plan:CM will follow closely for disposition/discharge needs.    Expected Discharge Date:                  Expected Discharge Plan:     In-House Referral:     Discharge planning Services  CM Consult, Medication Assistance  Post Acute Care Choice:    Choice offered to:     DME Arranged:    DME Agency:     HH Arranged:    HH Agency:     Status of Service:  In process, will continue to follow  If discussed at Long Length of Stay Meetings, dates discussed:    Additional Comments:  Delrae Sawyers, RN 08/26/2017, 3:02 PM

## 2017-08-26 NOTE — Progress Notes (Addendum)
Inpatient Diabetes Program Recommendations  AACE/ADA: New Consensus Statement on Inpatient Glycemic Control (2015)  Target Ranges:  Prepandial:   less than 140 mg/dL      Peak postprandial:   less than 180 mg/dL (1-2 hours)      Critically ill patients:  140 - 180 mg/dL    MD- Patient seems as if he may have difficulty taking insulin 4 times per day at home.  Pt is very worried about the cost of insulin and CBG meter and supplies.  States he has Medicare and [place] Medicaid (do not see Medicaid listed on insurance list).  States he doesn't have a car and lives on a very fixed income.    May be best to send pt home on very simple insulin regimen with follow-up with his PCP for further DM management.  Recommend we convert pt to 70/30 insulin and have him d/c home on BID dosing of insulin for cost considerations and ease of administration.  Based on his current doses (and based on weight), recommend the following:  1. Stop Lantus and Stop Novolog 5 units TID (meal coverage)  2. Increase Novolog SSI to Moderate scale (0-15 units) TID AC + HS  3. Start 70/30 Insulin- 15 units BID with meals (this would give pt about 21 units long-acting insulin divided into two doses throughout the day and 4.5 units quick acting insulin BID with meals)     Spoke with pt about new diagnosis by phone (DM Coordinator available by phone consult over the weekend).  Discussed A1C results with him and explained what an A1C is, basic pathophysiology of DM Type 2, basic home care, basic diabetes diet nutrition principles, importance of checking CBGs and maintaining good CBG control to prevent long-term and short-term complications.  Reviewed signs and symptoms of hyperglycemia and hypoglycemia and how to treat both conditions at home.  Also reviewed blood sugar goals and A1c goals for home.  Encouraged pt to purchase glucose tablets or juice boxes for HYPO treatment at home.  RNs to provide ongoing basic DM education at  bedside with this patient.  Have ordered educational booklet and DM videos.  Have also placed RD consult for DM diet education for this patient and have also ordered Outpatient DM Education for pt to follow up at [facility] and DM management center after d/c.  Patient was very talkative by phone, however, he talked over me quite a bit and focused heavily on the cost of medication, the cost of food, and the fact that he does not have transportation.  Seemed very nervous about this new diagnosis and was very negative about most of the information I provided to him.  For example, I discussed with patient ways to cut back on carbohydrates and ways to incorporate more protein and vegetables into the diet.  Patient told me meat and vegetables are expensive and that he just wants to go to store and buy already prepared meals.  I discussed with patient that pre-packaged meals can be expensive and not very healthy.  Patient then proceeded to tell me that he just wishes there was a store you go to buy "ready-made" platters of food.  I also discussed avoiding beverages with sugar like sweet tea, regular soda and fruit juice.  Pt proceeded to tell me that he doesn't have money to go buy water and would rather drink tea.  I suggested to pt that he doesn't have to buy water but can drink tap water.  Pt told me the tap water at his house tastes bad.  I encouraged pt to buy a water filter for the tap or a Brita water filter for a pitcher, but patient went on to tell me that he has a water filter but that it doesn't fit his faucet.  Patient also went on to tell me that he is planning a trip to [place] on 09/11/17 and that this new diagnosis will ruin his trip.  Stated to me that "this is not how I wanted to spend Jesus' birthday".    I have asked the RNs working with pt to continue to teach pt how to check his CBGs and draw up and give insulin.  Patient will require must reinforcement as he appears to be very  negative and worried about this new diagnosis.     --Will follow patient during hospitalization--  Wyn Quaker RN, MSN, CDE Diabetes Coordinator Inpatient Glycemic Control Team Team Pager: [phone] (8a-5p)

## 2017-08-27 ENCOUNTER — Telehealth: Payer: Self-pay | Admitting: Internal Medicine

## 2017-08-27 ENCOUNTER — Ambulatory Visit: Payer: Self-pay | Admitting: *Deleted

## 2017-08-27 DIAGNOSIS — E119 Type 2 diabetes mellitus without complications: Secondary | ICD-10-CM | POA: Diagnosis not present

## 2017-08-27 DIAGNOSIS — R739 Hyperglycemia, unspecified: Secondary | ICD-10-CM | POA: Diagnosis not present

## 2017-08-27 DIAGNOSIS — F319 Bipolar disorder, unspecified: Secondary | ICD-10-CM | POA: Diagnosis not present

## 2017-08-27 DIAGNOSIS — E1110 Type 2 diabetes mellitus with ketoacidosis without coma: Secondary | ICD-10-CM | POA: Diagnosis not present

## 2017-08-27 DIAGNOSIS — E876 Hypokalemia: Secondary | ICD-10-CM | POA: Diagnosis not present

## 2017-08-27 DIAGNOSIS — K219 Gastro-esophageal reflux disease without esophagitis: Secondary | ICD-10-CM | POA: Diagnosis not present

## 2017-08-27 LAB — BASIC METABOLIC PANEL
ANION GAP: 6 (ref 5–15)
BUN: 11 mg/dL (ref 6–20)
CALCIUM: 8.6 mg/dL — AB (ref 8.9–10.3)
CO2: 20 mmol/L — ABNORMAL LOW (ref 22–32)
Chloride: 109 mmol/L (ref 101–111)
Creatinine, Ser: 0.7 mg/dL (ref 0.61–1.24)
Glucose, Bld: 172 mg/dL — ABNORMAL HIGH (ref 65–99)
Potassium: 3.3 mmol/L — ABNORMAL LOW (ref 3.5–5.1)
SODIUM: 135 mmol/L (ref 135–145)

## 2017-08-27 LAB — GLUCOSE, CAPILLARY: Glucose-Capillary: 182 mg/dL — ABNORMAL HIGH (ref 65–99)

## 2017-08-27 MED ORDER — LORAZEPAM 2 MG/ML IJ SOLN
1.0000 mg | Freq: Once | INTRAMUSCULAR | Status: AC
  Administered 2017-08-27: 1 mg via INTRAVENOUS
  Filled 2017-08-27: qty 1

## 2017-08-27 NOTE — Telephone Encounter (Signed)
Called in c/o leaving the hospital because "he was sick of being stuck and poked"   "They wouldn't let me eat"   "They told me I had type II diabetes"   "I just got tired of it all and left"  The agent went ahead and made him an appt with Dr. Sharlet Salina for 9:00 am Wednesday morning right after Christmas.  I allowed the pt to ventilate his frustration with being in the hospital.  No advice given.   Most of the conversation was letting him ventilate.  I instructed him to be sure and go to the dr appt on Wed. At 9:00.    He assured me he would go.

## 2017-08-27 NOTE — Progress Notes (Signed)
Pt left AMA with all belongings. Signed AMA form. Notified MD.

## 2017-08-27 NOTE — Progress Notes (Signed)
Pt verbally abusive, agitated, and anxious. Requesting pain meds/muscle relaxant.  Refused tele monitor.  Refuse all care. Doesn't want blood sugar taken and insulin. Want to go home, but want to wait for social worker. On call NP notified. Order Ativan.  Will continue to monitor pt.

## 2017-08-27 NOTE — Discharge Summary (Signed)
Discharge Summary  Antonio Lawson OMV:[id number] DOB: 1961-03-10  PCP: Hoyt Koch, MD  Admit date: 08/25/2017 Discharge date: 08/27/2017  Time spent: Signed AMA  Recommendations for Outpatient Follow-up:  Signed AMA  Discharge Diagnoses:  Active Hospital Problems   Diagnosis Date Noted  . DKA, type 2 ([place]) 08/25/2017  . Hypocalcemia 08/26/2017  . Bipolar I disorder ([place]) 03/28/2016  . GERD (gastroesophageal reflux disease) 03/28/2016    Resolved Hospital Problems  No resolved problems to display.    Discharge Condition: Signed AMA  Diet recommendation:Signed AMA   Vitals:   08/26/17 2006 08/26/17 2346  BP: 123/78 109/69  Pulse: 90 73  Resp: 18 (!) 25  Temp: 98.3 F (36.8 C) 98.2 F (36.8 C)  SpO2: 97% 94%    History of present illness:  Antonio Lawson a 56 y.o.malewith medical history significant ofbipolar 1 disorder, GERD,and H/Odiverticulosis/diverticulitis; who presents with complaints of elevated blood sugar.For the last 2-3days he has had complaints of dry mouth, increased thirst, polydipsia, and polyuria. He reported drinking large amounts of fluid including water,sweet tea, and Gatorade. Notes at baseline that he has a sweet tooth, but denies any family history of diabetes to his knowledge. He reports being on Seroquel for insomnia and has been taking this medicine for 1 year now. However, notes that he is in the process of trying to wean himself off of this medication at this time. Other associated symptoms include weight loss possibly of 20 pounds. In the ED, glucose noted to be 582, anion gap 16,and venous pH 7.343. Patient was given 2 L of normal saline IV fluids and repeat labs revealed anion gap 13, and CBG 396. Pt admitted for further management  Hospital Course:  Principal Problem:   DKA, type 2 ([place]) Active Problems:   GERD (gastroesophageal reflux disease)   Bipolar I disorder (HCC)   Hypocalcemia  #New onset DM  with DKA Resolving, A1c 14.5 New onset DM with BG 582, CO2 16 and elevated anion gap of 16, +ketones in U/A - S/P Glucose stabilizer protocol initiated, d/c on 12/23 after closed gap - Start [place] glargine 15U, aspart 5U TID, SSI with Q4H CBG, serial BMP - Diabetes education/Nutrition - Plan to d/c with insulin, with close follow up with PCP, but pt signed AMA  #Hyponatremia Resolving Initial sodium noted to be 126 on admission, likely due to hyperglycemia  #Hypokalemia Likely due to insulin use in the setting of hyperglycemia Replace prn  #Bipolar1disorder/insomnia Patient currently on Seroquel (side effect includes diabetes) No HI/SI, refuses to be seen by mental health for other meds options, PCP to follow and refer pt on the outside Continue Seroquel for now  #GERD -Nexium as needed    Procedures:  None  Consultations:  None  Discharge Exam: BP 109/69 (BP Location: Left Arm)   Pulse 73   Temp 98.2 F (36.8 C) (Oral)   Resp (!) 25   Ht 6\' 1"  (1.854 m)   Wt 93 kg (205 lb)   SpO2 94%   BMI 27.05 kg/m   General: Not done, signed AMA Cardiovascular: ''  Respiratory: "  Discharge Instructions You were cared for by a hospitalist during your hospital stay. If you have any questions about your discharge medications or the care you received while you were in the hospital after you are discharged, you can call the unit and asked to speak with the hospitalist on call if the hospitalist that took care of you is not available. Once you are discharged,  your primary care physician will handle any further medical issues. Please note that NO REFILLS for any discharge medications will be authorized once you are discharged, as it is imperative that you return to your primary care physician (or establish a relationship with a primary care physician if you do not have one) for your aftercare needs so that they can reassess your need for medications and monitor your lab  values.  Discharge Instructions    Ambulatory referral to Nutrition and Diabetic Education   Complete by:  As directed    New Diagnosis of DM.  Current A1c= 14.5%.  PCP: Dr. Sharlet Salina with [facility].     Allergies as of 08/27/2017   No Known Allergies     Medication List    ASK your doctor about these medications   NEXIUM 24HR 20 MG Tbec Generic drug:  Esomeprazole Magnesium Take 20 mg by mouth daily as needed (for reflux symptoms).   oxyCODONE-acetaminophen 10-325 MG tablet Commonly known as:  PERCOCET Take 1 tablet by mouth every 8 (eight) hours as needed for pain.   QUEtiapine 100 MG tablet Commonly known as:  SEROQUEL Take 50 mg by mouth at bedtime.      No Known Allergies    The results of significant diagnostics from this hospitalization (including imaging, microbiology, ancillary and laboratory) are listed below for reference.    Significant Diagnostic Studies: Portable Chest X-ray (1 View)  Result Date: 08/25/2017 CLINICAL DATA:  Hyperglycemia. EXAM: PORTABLE CHEST 1 VIEW COMPARISON:  07/26/2015 FINDINGS: Lungs are adequately inflated without focal airspace consolidation or effusion. Cardiomediastinal silhouette and remainder of the exam is unchanged. IMPRESSION: No active disease. Electronically Signed   By: Marin Olp M.D.   On: 08/25/2017 20:02    Microbiology: Recent Results (from the past 240 hour(s))  MRSA PCR Screening     Status: None   Collection Time: 08/26/17  6:42 AM  Result Value Ref Range Status   MRSA by PCR NEGATIVE NEGATIVE Final    Comment:        The GeneXpert MRSA Assay (FDA approved for NASAL specimens only), is one component of a comprehensive MRSA colonization surveillance program. It is not intended to diagnose MRSA infection nor to guide or monitor treatment for MRSA infections.      Labs: Basic Metabolic Panel: Recent Labs  Lab 08/26/17 0248 08/26/17 0555 08/26/17 1136 08/26/17 1554 08/27/17 0212  NA  132* 134* 130* 129* 135  K 3.2* 2.9* 3.7 3.7 3.3*  CL 108 109 104 104 109  CO2 18* 20* 21* 19* 20*  GLUCOSE 255* 140* 326* 354* 172*  BUN 13 12 12 12 11   CREATININE 0.78 0.74 0.79 0.88 0.70  CALCIUM 8.2* 8.2* 8.2* 8.4* 8.6*   Liver Function Tests: No results for input(s): AST, ALT, ALKPHOS, BILITOT, PROT, ALBUMIN in the last 168 hours. No results for input(s): LIPASE, AMYLASE in the last 168 hours. No results for input(s): AMMONIA in the last 168 hours. CBC: Recent Labs  Lab 08/25/17 1334 08/26/17 0248  WBC 10.3 6.4  HGB 15.4 12.6*  HCT 42.4 35.0*  MCV 87.8 87.7  PLT 237 185   Cardiac Enzymes: No results for input(s): CKTOTAL, CKMB, CKMBINDEX, TROPONINI in the last 168 hours. BNP: BNP (last 3 results) No results for input(s): BNP in the last 8760 hours.  ProBNP (last 3 results) No results for input(s): PROBNP in the last 8760 hours.  CBG: Recent Labs  Lab 08/26/17 1226 08/26/17 1710 08/26/17 2040 08/26/17 2350  08/27/17 0521  GLUCAP 310* 318* 331* 317* 182*       Signed:  Alma Friendly, MD [facility] 08/27/2017, 10:42 PM

## 2017-08-29 ENCOUNTER — Inpatient Hospital Stay: Payer: Medicare HMO | Admitting: Internal Medicine

## 2017-08-30 ENCOUNTER — Other Ambulatory Visit (INDEPENDENT_AMBULATORY_CARE_PROVIDER_SITE_OTHER): Payer: Medicare HMO

## 2017-08-30 ENCOUNTER — Encounter: Payer: Self-pay | Admitting: Family Medicine

## 2017-08-30 ENCOUNTER — Ambulatory Visit (INDEPENDENT_AMBULATORY_CARE_PROVIDER_SITE_OTHER): Payer: Medicare HMO | Admitting: Family Medicine

## 2017-08-30 VITALS — BP 138/86 | HR 86 | Temp 98.6°F | Ht 73.0 in | Wt 211.0 lb

## 2017-08-30 DIAGNOSIS — E119 Type 2 diabetes mellitus without complications: Secondary | ICD-10-CM | POA: Insufficient documentation

## 2017-08-30 DIAGNOSIS — E118 Type 2 diabetes mellitus with unspecified complications: Secondary | ICD-10-CM

## 2017-08-30 DIAGNOSIS — F319 Bipolar disorder, unspecified: Secondary | ICD-10-CM

## 2017-08-30 LAB — BASIC METABOLIC PANEL
BUN: 13 mg/dL (ref 6–23)
CALCIUM: 9.4 mg/dL (ref 8.4–10.5)
CO2: 20 mEq/L (ref 19–32)
Chloride: 100 mEq/L (ref 96–112)
Creatinine, Ser: 0.85 mg/dL (ref 0.40–1.50)
GFR: 99.8 mL/min (ref >60.00)
Glucose, Bld: 363 mg/dL — ABNORMAL HIGH (ref 70–99)
Potassium: 3.5 mEq/L (ref 3.5–5.1)
SODIUM: 133 meq/L — AB (ref 135–145)

## 2017-08-30 NOTE — Progress Notes (Signed)
Antonio Lawson - 56 y.o. male MRN [id number]  Date of birth: 03-18-1961  SUBJECTIVE:  Including CC & ROS.  Chief Complaint  Patient presents with  . Follow-up    Antonio Lawson is a 56 y.o. male that is for excessive thirst . He was seen in the ER on 08/25/17 was admitted.  He states he was tested for diabetes and needs further testing. He reports that he has not taken any medications for his diabetes. He has not convinced that he does have diabetes. He has some family members with diabetes. He hasn't financial hardships and cannot afford a lot of medications.  Patient was seen on 12/22 and found to be hyperglycemic. He was advised to be seen in the emergency room. He was subsequently admitted for new onset diabetes with DKA. An A1c was performed and found to be 14.5. During this admission he left AMA.  Patient expresses concerns with insulin and that he feels that his problems will be cured by [facility]. He feels that the insulin is a poison. He reports that someone with diabetes came over to his house and cursed him with having diabetes.   Review of Systems  Constitutional: Negative for fever.  Respiratory: Negative for shortness of breath.   Cardiovascular: Negative for chest pain.  Gastrointestinal: Negative for abdominal pain.  Endocrine: Positive for polyphagia. Negative for polyuria.  Musculoskeletal: Negative for gait problem.  Skin: Negative for color change.  Neurological: Negative for weakness.  Hematological: Negative for adenopathy.  Psychiatric/Behavioral: Positive for agitation and behavioral problems. Negative for suicidal ideas.    HISTORY: Past Medical, Surgical, Social, and Family History Reviewed & Updated per EMR.   Pertinent Historical Findings include:  Past Medical History:  Diagnosis Date  . Bipolar 1 disorder ([place])   . Chicken pox   . Diverticulitis   . Diverticulosis   . Fatty liver   . GERD (gastroesophageal reflux disease)   . Hyperplastic  colon polyp   . Internal hemorrhoids     Past Surgical History:  Procedure Laterality Date  . APPENDECTOMY    . HERNIA REPAIR      No Known Allergies  Family History  Problem Relation Age of Onset  . Pancreatic cancer Mother   . Heart attack Father   . Stroke Father   . Esophageal cancer Maternal Grandfather      Social History   Socioeconomic History  . Marital status: Single    Spouse name: Not on file  . Number of children: 0  . Years of education: 85  . Highest education level: Not on file  Social Needs  . Financial resource strain: Not on file  . Food insecurity - worry: Not on file  . Food insecurity - inability: Not on file  . Transportation needs - medical: Not on file  . Transportation needs - non-medical: Not on file  Occupational History  . Occupation: Disability    Comment: Personel  Tobacco Use  . Smoking status: Former Smoker    Packs/day: 1.00    Types: Cigarettes    Last attempt to quit: 08/18/2017    Years since quitting: 0.0  . Smokeless tobacco: Never Used  Substance and Sexual Activity  . Alcohol use: No  . Drug use: No  . Sexual activity: Not on file  Other Topics Concern  . Not on file  Social History Narrative  . Not on file     PHYSICAL EXAM:  VS: BP 138/86 (BP Location: Left Arm, Patient Position:  Sitting, Cuff Size: Normal)   Pulse 86   Temp 98.6 F (37 C) (Oral)   Ht 6\' 1"  (1.854 m)   Wt 211 lb (95.7 kg)   SpO2 98%   BMI 27.84 kg/m  Physical Exam Gen: NAD, alert, cooperative with exam,  ENT: normal lips, normal nasal mucosa,  Eye: normal EOM, normal conjunctiva and lids CV:  no edema, +2 pedal pulses   Resp: no accessory muscle use, non-labored,  Skin: no rashes, no areas of induration  Neuro: normal tone, normal sensation to touch Psych: Lack of insight, denies SI or HI MSK: Normal strength, normal gait      ASSESSMENT & PLAN:   Bipolar I disorder (HCC) He has some paranoia associated with the treatment for  diabetes. Interaction today suggests that he has delusions associated with acquiring diabetes and also the treatment for it. Denies any history of previous behavioral health admission. Denies any harm to himself or others - Referral to psychiatry  DM (diabetes mellitus) ([place]) Uncontrolled. Has an unclear understanding of diabetes and its treatment. It appears that his Behavioral Health history is complicating his treatment. He may need help with his Behavioral Health condition before achieving control of his diabetes. He reports financial problems and is not able to afford insulin. - BMP today - Discussed and counseled that if his blood sugars severely elevated then he will need to be seen in the emergency department. Discussed the role of insulin and how it is used to help treat diabetes when the A1c is elevated such as his. He declined diabetic education today. He will likely need diabetic education and instructions on insulin use.

## 2017-08-30 NOTE — Patient Instructions (Signed)
We will call you with the results from today. If your blood sugar is elevated then you may need to go into the hospital to get your blood sugar under control.

## 2017-08-30 NOTE — Telephone Encounter (Signed)
Pt called and upset that no one called him with lab results from today. Pt can be reached at [phone] or [phone]. Please call him.

## 2017-08-31 ENCOUNTER — Telehealth: Payer: Self-pay | Admitting: Family Medicine

## 2017-08-31 ENCOUNTER — Telehealth: Payer: Self-pay | Admitting: Internal Medicine

## 2017-08-31 NOTE — Assessment & Plan Note (Signed)
Uncontrolled. Has an unclear understanding of diabetes and its treatment. It appears that his Behavioral Health history is complicating his treatment. He may need help with his Behavioral Health condition before achieving control of his diabetes. He reports financial problems and is not able to afford insulin. - BMP today - Discussed and counseled that if his blood sugars severely elevated then he will need to be seen in the emergency department. Discussed the role of insulin and how it is used to help treat diabetes when the A1c is elevated such as his. He declined diabetic education today. He will likely need diabetic education and instructions on insulin use.

## 2017-08-31 NOTE — Telephone Encounter (Signed)
Copied from [place] [id number]. Topic: Quick Communication - Rx Refill/Question >> Aug 31, 2017 10:42 AM Antonio Lawson wrote: Reason for CRM: pt called Lawson/c he is not able to get in contact w/ his insurance provider(no one has returned his call), he doesn't know what to do about the insulin. He also cant afford to buy it. He tried contacting [employer] as well but no reply either and is just wanting some counsel on some alternatives or other routes that he can deal w/ this. Contact pt to advise

## 2017-08-31 NOTE — Assessment & Plan Note (Signed)
He has some paranoia associated with the treatment for diabetes. Interaction today suggests that he has delusions associated with acquiring diabetes and also the treatment for it. Denies any history of previous behavioral health admission. Denies any harm to himself or others - Referral to psychiatry

## 2017-08-31 NOTE — Telephone Encounter (Signed)
He returned a call to the office regarding his lab results.  I read the lab results that Dr. Clearance Coots had put in his note for [place] to relay to the pt to Mr. Lacount.  Mr Kam responded,  "I'm not taking this insulin shit"  "I'm not going to a class for them to tell me how to shoot insulin in my body". "I can't afford all of this"   "I left the hospital because I got fed up with them sticking and poking me constantly".   "They don't know what they are doing".   "The hospital didn't do nothing for me".  I allowed him to ventilate.   I encouraged him to go to the diabetes class Dr. Raeford Razor recommended.   "I don't have time to do that".     He informed me he is leaving for [place] to visit a friend.   He is leaving Jan 8th and will be gone for 10 days..  I routed a note to Dr. Doristine Locks nurse pool requesting he call the pt back as he had directed in his note.

## 2017-08-31 NOTE — Telephone Encounter (Signed)
Pt called and upset that no one called him with lab results from today. Pt can be reached at [phone] or [phone]. Please call him.

## 2017-08-31 NOTE — Telephone Encounter (Signed)
New telephone encounter created for 12/28, message below should have been separate as the original message was created for Podiatry

## 2017-08-31 NOTE — Telephone Encounter (Signed)
Pt called and upset because he is just diagnosed with diabetes and has no supplies, has insurance concerns, and that no one has returned his calls; attempted to give pt the message per Dr Raeford Razor but he was not receptive to delivery; conference call initiated with Wells Guiles and Edd Arbour and they will schedule the pt an appointment for diabetic education and deal with insurance issues because when the pt calls the insurance company he is told that they are on vacation until September 05, 2017. Pt is adamant that insulin prescription be sent to pharmacy and "he doesn't want to talk anymore".

## 2017-08-31 NOTE — Telephone Encounter (Signed)
Left VM for patient. If he calls back please have him speak with a nurse/CMA and inform that his blood sugar is still elevated but his kidney function is normal. Could start metformin but this will not bring down his sugar as much as we need it to. He will need to be placed on some form of insulin. Insulin can be expensive so he needs to call his insurance carrier and ask what insulin is covered and which diabetic supplies are preferred. He will need education on how to use and administer insulin as well as continuing education about diabetes. Please see if he can be seen in a nurse clinic at [facility] for diabetic education.   If any questions then please take the best time and phone number to call and I will try to call him back.   Rosemarie Ax, MD [facility] 08/31/2017, 9:25 AM

## 2017-09-03 NOTE — Telephone Encounter (Signed)
I am not sure what the whole plan was to call and give him a response. Routing to you since Raeford Razor saw him for this

## 2017-09-03 NOTE — Telephone Encounter (Signed)
Pt is calling back today and is upset that he still hasn't gotten any instructions of what to do about his insulin, instructions for diabetes, how to sign up for the class to take, etc. Pt would like a call back with some instructions. Pt was not aware of his appt last Wednesday with Dr. Sharlet Salina.

## 2017-09-03 NOTE — Telephone Encounter (Signed)
Just routing to you as an [place]

## 2017-09-05 ENCOUNTER — Encounter: Payer: Self-pay | Admitting: Podiatry

## 2017-09-05 ENCOUNTER — Ambulatory Visit: Payer: Medicare HMO | Admitting: Family

## 2017-09-05 ENCOUNTER — Ambulatory Visit (INDEPENDENT_AMBULATORY_CARE_PROVIDER_SITE_OTHER): Payer: Medicare HMO | Admitting: Podiatry

## 2017-09-05 DIAGNOSIS — Z9889 Other specified postprocedural states: Secondary | ICD-10-CM

## 2017-09-05 MED ORDER — OXYCODONE-ACETAMINOPHEN 10-325 MG PO TABS: 1.0000 | ORAL_TABLET | Freq: Three times a day (TID) | ORAL | 0 refills | Status: DC | PRN

## 2017-09-05 NOTE — Patient Instructions (Signed)
Still having some pain at the left big joint. Meantime diagnosed with diabetes and is still not controlled. Scheduled to go to [facility] and pain medication prescribed as per request. Return as needed.

## 2017-09-05 NOTE — Progress Notes (Signed)
10 weeks S/P [facility] bunionectomy, 06/15/17.  Using a new shoe and still having pain at the joint.  Was in hospital with blood sugar over 600 on 08/26/17. Been on Insulin since then. 355 was the last reading 10 days ago. A1c was 14. Having difficulty coping with change in diet. He is planning on moving to [facility] and in need of pain medication before his travel. Reviewed findings and effect of diabetes on feet.  Pain medication prescribed as per request.

## 2017-09-05 NOTE — Telephone Encounter (Signed)
Patient advised on 08/31/17 per Dr. Doristine Locks instructions he was to call his insurance company to see which insulin was covered. He was very verbal and used profanity. Offered to discuss further with a nurse to schedule an insulin education appointment which he declined. Dr. Raeford Razor aware. Patient scheduled with PCP 09/06/17.

## 2017-09-06 ENCOUNTER — Telehealth: Payer: Self-pay | Admitting: Internal Medicine

## 2017-09-06 ENCOUNTER — Encounter: Payer: Self-pay | Admitting: Internal Medicine

## 2017-09-06 ENCOUNTER — Ambulatory Visit (INDEPENDENT_AMBULATORY_CARE_PROVIDER_SITE_OTHER): Payer: Medicare HMO | Admitting: Internal Medicine

## 2017-09-06 ENCOUNTER — Telehealth: Payer: Self-pay

## 2017-09-06 VITALS — BP 110/78 | HR 85 | Temp 98.1°F | Ht 73.0 in | Wt 208.0 lb

## 2017-09-06 DIAGNOSIS — E1142 Type 2 diabetes mellitus with diabetic polyneuropathy: Secondary | ICD-10-CM

## 2017-09-06 DIAGNOSIS — F319 Bipolar disorder, unspecified: Secondary | ICD-10-CM | POA: Diagnosis not present

## 2017-09-06 MED ORDER — BLOOD GLUCOSE METER KIT: PACK | 0 refills | Status: DC

## 2017-09-06 MED ORDER — METFORMIN HCL 1000 MG PO TABS: 1000.0000 mg | ORAL_TABLET | Freq: Two times a day (BID) | ORAL | 0 refills | Status: AC

## 2017-09-06 MED ORDER — DICLOFENAC SODIUM 1 % TD GEL: 2.0000 g | Freq: Four times a day (QID) | TRANSDERMAL | 0 refills | Status: AC

## 2017-09-06 NOTE — Telephone Encounter (Signed)
Per dr schmitz--patient called and was very abusive with staff at [facility] office and also [facility] on the telephone, and for the safety of the staff, patient should be dismissed from our practice if he is not willing to be seen and helped by behavioral health---patient is scheduled to be seen by dr crawford on 09/06/17 and I have advised dr Sharlet Salina of dr schmitz recommendations.  She will discuss with patient in office visit

## 2017-09-06 NOTE — Patient Instructions (Addendum)
We have sent in metformin to take for the sugars. Take 1 pill twice a day with meals.   We have put in a referral for the mental health to get you better care.   We have sent in a cream to use on your side for pain.   Call us with any problems.   You did fine today but were rude to staff on the phone and at the office with Dr. Kallie Locks. This behavior is not okay and if you are rude on the phone more we will not be able to see you at this office anymore.

## 2017-09-06 NOTE — Telephone Encounter (Signed)
Have removed PCP attribution as he is not mine and I have never seen patient.

## 2017-09-06 NOTE — Telephone Encounter (Signed)
Copied from [place] [id number]. Topic: Quick Communication - Rx Refill/Question >> Sep 06, 2017  6:03 PM Tye [place] wrote: Pt got to the pharmacy and they did not have the  blood glucose meter kit and supplies [[id number]] available, [facility] [address], contact the [facility] if needed

## 2017-09-07 ENCOUNTER — Other Ambulatory Visit: Payer: Self-pay

## 2017-09-07 MED ORDER — BLOOD GLUCOSE METER KIT: PACK | 0 refills | Status: DC

## 2017-09-07 MED ORDER — BLOOD GLUCOSE METER KIT: PACK | 0 refills | Status: AC

## 2017-09-07 NOTE — Telephone Encounter (Signed)
Pharmacy contacted they are just waiting for the lancets to come in. Patient contacted and informed

## 2017-09-07 NOTE — Assessment & Plan Note (Signed)
Referral to psychiatry. Currently going to [facility] and feels that they are not caring for him well. He was advised that his behavior previously at another location with being rude to staff is not acceptable and that this is not tolerated. If any recurrence he will be dismissed.

## 2017-09-07 NOTE — Assessment & Plan Note (Signed)
Unclear how long he has had diabetes. Will start metformin 1000 mg BID and bring back in several months with sugar log. He is willing to make serious changes to diet and food intake. He will likely need additional treatment in several months but getting him invested and behind his health care will long term be more effective management than starting him on multiple medications today without his approval. He is in agreement with this plan.

## 2017-09-07 NOTE — Progress Notes (Signed)
   Subjective:    Patient ID: Antonio Lawson, male    DOB: [birth year], 57 y.o.   MRN: [id number]  HPI The patient is a 57 YO man coming in for follow up of newly diagnosed diabetes. He is willing to do what needs to be done for his care. He feels that he was not treated well in the hospital. He has been very thirsty and drinking sodas, gatorade and sugary drinks previously. Since finding out he has diabetes he has not been doing this as much. Drinking more water. Some abdominal discomfort and mild nausea as well as thirst. He denies confusion. Sometimes he has some blurred vision. He is still taking his seroquel and feels that his mental health provider is not taking good care of him.   Review of Systems  Constitutional: Positive for appetite change. Negative for activity change, fatigue, fever and unexpected weight change.  HENT: Negative.   Eyes: Positive for visual disturbance.  Respiratory: Negative for cough, chest tightness and shortness of breath.   Cardiovascular: Negative for chest pain, palpitations and leg swelling.  Gastrointestinal: Positive for abdominal pain and nausea. Negative for abdominal distention, constipation, diarrhea and vomiting.  Endocrine: Positive for polydipsia, polyphagia and polyuria.  Musculoskeletal: Positive for arthralgias.  Skin: Negative.   Neurological: Positive for numbness.  Psychiatric/Behavioral: Positive for dysphoric mood. The patient is nervous/anxious and is hyperactive.       Objective:   Physical Exam  Constitutional: He is oriented to person, place, and time. He appears well-developed and well-nourished.  HENT:  Head: Normocephalic and atraumatic.  Eyes: EOM are normal.  Neck: Normal range of motion.  Cardiovascular: Normal rate and regular rhythm.  Pulmonary/Chest: Effort normal and breath sounds normal. No respiratory distress. He has no wheezes. He has no rales.  Abdominal: Soft. Bowel sounds are normal. He exhibits no distension  and no mass. There is no tenderness. There is no rebound and no guarding.  Neurological: He is alert and oriented to person, place, and time. Coordination normal.  Skin: Skin is warm and dry.   Vitals:   09/06/17 1555  BP: 110/78  Pulse: 85  Temp: 98.1 F (36.7 C)  TempSrc: Oral  SpO2: 99%  Weight: 208 lb (94.3 kg)  Height: 6\' 1"  (1.854 m)      Assessment & Plan:  Visit time 25 minutes: greater than 50% of that was spent in face to face counseling and coordination of care with the patient: counseling about his new diabetes, mental health, appropriate behavior.

## 2017-09-14 ENCOUNTER — Ambulatory Visit: Payer: Self-pay

## 2017-09-14 NOTE — Telephone Encounter (Signed)
rec'd call from pt. With blood sugar of 398. Reported he is at his cousin's home, who is a Marine scientist. Gave permission to speak with his cousin for Triage questions; stated "I'm not feeling that well."  Per pt's cousin, he has been running elevated BS since he arrived on Tuesday. Reported a BS on Tues. of 355.  Stated the pt. Is a new diabetic, and taking Metformin 1000 mg BID.  Stated his BS at 9:00 AM was 398; rechecked at noon, and was still 398. Reported the pt. C/o nausea, frequent urination, and vision not clear; described that "eyes are starry, like when the blood sugar is high."  Pt. talking in the background.  Advised that pt. Should go to Urgent Care or the ER at this time, since he is in [facility].  Per the cousin, the pt. Will return to home next week.  Appt. was scheduled for pt. to f/u with his PCP next week, on 09/19/17. Cousin verb. Understanding. Stated she will assist pt. In getting emergency care at this time.  Pt's cousin inquired about referral to Endocrinologist.         Reason for Disposition . [1] Blood glucose > 300 mg/dl (16.5 mmol/l) AND [2] two or more times in a row  Answer Assessment - Initial Assessment Questions 1. BLOOD GLUCOSE: "What is your blood glucose level?"      398 @ 9:00 AM ; 398 @ 12:00 PM 2. ONSET: "When did you check the blood glucose?"     At 9:00 AM 3. USUAL RANGE: "What is your glucose level usually?" (e.g., usual fasting morning value, usual evening value)     Was hospitalized 2-3 weeks ago for BS over 600 ; AIC 14.0; just rec'd glucose meter  4. KETONES: "Do you check for ketones (urine or blood test strips)?" If yes, ask: "What does the test show now?"      *No Answer* 5. TYPE 1 or 2:  "Do you know what type of diabetes you have?"  (e.g., Type 1, Type 2, Gestational; doesn't know)      Type 2 6. INSULIN: "Do you take insulin?" If yes, ask: "Have you missed any shots recently?"     no 7. DIABETES PILLS: "Do you take any pills for your diabetes?" If yes, ask:  "Have you missed taking any pills recently?"    Metformin 1000 mg BID; no missed doses  8. OTHER SYMPTOMS: "Do you have any symptoms?" (e.g., fever, frequent urination, difficulty breathing, dizziness, weakness, vomiting)    No freq urination;  C/o nausea, seeing stars with eyes  9. PREGNANCY: "Is there any chance you are pregnant?" "When was your last menstrual period?"     n/a  Protocols used: DIABETES - HIGH BLOOD SUGAR-A-AH

## 2017-09-19 ENCOUNTER — Ambulatory Visit (INDEPENDENT_AMBULATORY_CARE_PROVIDER_SITE_OTHER): Payer: Medicare HMO | Admitting: Internal Medicine

## 2017-09-19 ENCOUNTER — Telehealth: Payer: Self-pay | Admitting: Internal Medicine

## 2017-09-19 ENCOUNTER — Other Ambulatory Visit: Payer: Self-pay

## 2017-09-19 ENCOUNTER — Encounter: Payer: Self-pay | Admitting: Internal Medicine

## 2017-09-19 DIAGNOSIS — F319 Bipolar disorder, unspecified: Secondary | ICD-10-CM

## 2017-09-19 DIAGNOSIS — E1142 Type 2 diabetes mellitus with diabetic polyneuropathy: Secondary | ICD-10-CM

## 2017-09-19 MED ORDER — GLIPIZIDE ER 10 MG PO TB24: 10.0000 mg | ORAL_TABLET | Freq: Every day | ORAL | 0 refills | Status: DC

## 2017-09-19 NOTE — Telephone Encounter (Addendum)
Pt called stating that his prescription (glipiZIDE (GLUCOTROL XL) 10 MG 24 hr tablet) [facility] [address].

## 2017-09-19 NOTE — Progress Notes (Signed)
   Subjective:    Patient ID: Antonio Lawson, male    DOB: May 11, 1961, 57 y.o.   MRN: [id number]  HPI The patient is a 57 YO man with mental illness coming in for follow up. He is being loud in the lobby and frustrated about his health. He is brought back and calmed down. He is able to state that his sugars are still running high after being on metformin and he feels it is burning his stomach. He has been working on food but is not sure what to eat. He is wondering what to eat. His food stamps have been cut a lot and this is bothering him. His family members have been telling him he should get on insulin but he does not know what to do. He is not sure if he should go back to school or work or what. He does not know what god wants him to do. He is concerned that his meter is not working or he does not know how to use it. The lancet is the part he is having problems with. The device he has not used to know if it works. He is not checking his sugars. He then states that he does not think he could prick himself or stick himself with insulin and does not think he should do that.   Review of Systems  Unable to perform ROS: Psychiatric disorder      Objective:   Physical Exam  Constitutional:  Patient declines exam today   Vitals:   09/19/17 1121  BP: (!) 142/90  Pulse: (!) 104  Temp: 98 F (36.7 C)  TempSrc: Oral  SpO2: 99%  Height: 6\' 1"  (1.854 m)      Assessment & Plan:  Visit time 40 minutes: greater than 50% of that time was spent in face to face counseling and coordination of care: counseling with the patient about what diabetes is, foods that are appropriate, trying to get resources so he can have transportation, talking to him about different medications and insulins for comparison

## 2017-09-19 NOTE — Telephone Encounter (Signed)
Pt     Called     Very  Agitated   Stating  He  Has   Abdominal  Pain  And  His   Blood  Sugar is  Elevated    He  Has  An appointment  Today   With  Dr  Sharlet Salina  He  Stated  He  Had  No  Way to  Get  To  The  Office    And  Was  Having   severe  abd  Pain  Pt  Advised  He  Could call  911   If ne needed    And  He  Refused  Saying  He  Was  Not  Riding  In  An  Ambulance   Pt  [place]  Up    On  .  Attempted   To  Call  Patient back   .  Another  Person  Answered  And  In the  Background  I  Could  Here  Him  Saying  He  Did  Not  Want  To  Talk  To anyone .

## 2017-09-19 NOTE — Telephone Encounter (Signed)
[facility] [address]

## 2017-09-19 NOTE — Patient Instructions (Addendum)
We would like you to take the metformin twice a day.   We have sent in the medicine glipizide to take once a day to help the sugars.   [facility] is a place that helps with people with food needs or homelessness to get them in housing.  [place], [place], [place] [address]  Phone: [phone]  Fresh fruits and vegetables are good to eat. Oatmeal is good to eat.    Diabetes Mellitus and Nutrition When you have diabetes (diabetes mellitus), it is very important to have healthy eating habits because your blood sugar (glucose) levels are greatly affected by what you eat and drink. Eating healthy foods in the appropriate amounts, at about the same times every day, can help you:  Control your blood glucose.  Lower your risk of heart disease.  Improve your blood pressure.  Reach or maintain a healthy weight.  Every person with diabetes is different, and each person has different needs for a meal plan. Your health care provider may recommend that you work with a diet and nutrition specialist (dietitian) to make a meal plan that is best for you. Your meal plan may vary depending on factors such as:  The calories you need.  The medicines you take.  Your weight.  Your blood glucose, blood pressure, and cholesterol levels.  Your activity level.  Other health conditions you have, such as heart or kidney disease.  How do carbohydrates affect me? Carbohydrates affect your blood glucose level more than any other type of food. Eating carbohydrates naturally increases the amount of glucose in your blood. Carbohydrate counting is a method for keeping track of how many carbohydrates you eat. Counting carbohydrates is important to keep your blood glucose at a healthy level, especially if you use insulin or take certain oral diabetes medicines. It is important to know how many carbohydrates you can safely have in each meal. This is different for every person. Your dietitian can  help you calculate how many carbohydrates you should have at each meal and for snack. Foods that contain carbohydrates include:  Bread, cereal, rice, pasta, and crackers.  Potatoes and corn.  Peas, beans, and lentils.  Milk and yogurt.  Fruit and juice.  Desserts, such as cakes, cookies, ice cream, and candy.  How does alcohol affect me? Alcohol can cause a sudden decrease in blood glucose (hypoglycemia), especially if you use insulin or take certain oral diabetes medicines. Hypoglycemia can be a life-threatening condition. Symptoms of hypoglycemia (sleepiness, dizziness, and confusion) are similar to symptoms of having too much alcohol. If your health care provider says that alcohol is safe for you, follow these guidelines:  Limit alcohol intake to no more than 1 drink per day for nonpregnant women and 2 drinks per day for men. One drink equals 12 oz of beer, 5 oz of wine, or 1 oz of hard liquor.  Do not drink on an empty stomach.  Keep yourself hydrated with water, diet soda, or unsweetened iced tea.  Keep in mind that regular soda, juice, and other mixers may contain a lot of sugar and must be counted as carbohydrates.  What are tips for following this plan? Reading food labels  Start by checking the serving size on the label. The amount of calories, carbohydrates, fats, and other nutrients listed on the label are based on one serving of the food. Many foods contain more than one serving per package.  Check the total grams (g) of carbohydrates in  one serving. You can calculate the number of servings of carbohydrates in one serving by dividing the total carbohydrates by 15. For example, if a food has 30 g of total carbohydrates, it would be equal to 2 servings of carbohydrates.  Check the number of grams (g) of saturated and trans fats in one serving. Choose foods that have low or no amount of these fats.  Check the number of milligrams (mg) of sodium in one serving. Most  people should limit total sodium intake to less than 2,300 mg per day.  Always check the nutrition information of foods labeled as "low-fat" or "nonfat". These foods may be higher in added sugar or refined carbohydrates and should be avoided.  Talk to your dietitian to identify your daily goals for nutrients listed on the label. Shopping  Avoid buying canned, premade, or processed foods. These foods tend to be high in fat, sodium, and added sugar.  Shop around the outside edge of the grocery store. This includes fresh fruits and vegetables, bulk grains, fresh meats, and fresh dairy. Cooking  Use low-heat cooking methods, such as baking, instead of high-heat cooking methods like deep frying.  Cook using healthy oils, such as olive, canola, or sunflower oil.  Avoid cooking with butter, cream, or high-fat meats. Meal planning  Eat meals and snacks regularly, preferably at the same times every day. Avoid going long periods of time without eating.  Eat foods high in fiber, such as fresh fruits, vegetables, beans, and whole grains. Talk to your dietitian about how many servings of carbohydrates you can eat at each meal.  Eat 4-6 ounces of lean protein each day, such as lean meat, chicken, fish, eggs, or tofu. 1 ounce is equal to 1 ounce of meat, chicken, or fish, 1 egg, or 1/4 cup of tofu.  Eat some foods each day that contain healthy fats, such as avocado, nuts, seeds, and fish. Lifestyle   Check your blood glucose regularly.  Exercise at least 30 minutes 5 or more days each week, or as told by your health care provider.  Take medicines as told by your health care provider.  Do not use any products that contain nicotine or tobacco, such as cigarettes and e-cigarettes. If you need help quitting, ask your health care provider.  Work with a Social worker or diabetes educator to identify strategies to manage stress and any emotional and social challenges. What are some questions to ask my  health care provider?  Do I need to meet with a diabetes educator?  Do I need to meet with a dietitian?  What number can I call if I have questions?  When are the best times to check my blood glucose? Where to find more information:  American Diabetes Association: diabetes.org/food-and-fitness/food  Academy of Nutrition and Dietetics: PokerClues.dk  [employer] of Diabetes and Digestive and Kidney Diseases (NIH): ContactWire.be Summary  A healthy meal plan will help you control your blood glucose and maintain a healthy lifestyle.  Working with a diet and nutrition specialist (dietitian) can help you make a meal plan that is best for you.  Keep in mind that carbohydrates and alcohol have immediate effects on your blood glucose levels. It is important to count carbohydrates and to use alcohol carefully. This information is not intended to replace advice given to you by your health care provider. Make sure you discuss any questions you have with your health care provider. Document Released: 05/18/2005 Document Revised: 09/25/2016 Document Reviewed: 09/25/2016 Elsevier Interactive Patient Education  2018 [place].

## 2017-09-20 ENCOUNTER — Telehealth: Payer: Self-pay | Admitting: Internal Medicine

## 2017-09-20 ENCOUNTER — Telehealth: Payer: Self-pay

## 2017-09-20 NOTE — Telephone Encounter (Signed)
Copied from [place] [phone]. Topic: Referral - Request >> Sep 20, 2017  9:04 AM Valla Leaver wrote: Reason for CRM: Patient would like to get a referral to endocrinology. He does not feel like anyone's doing a great job of treating it so far and is newly diagnosed. Please f/u w/ him to let him know when it's placed or to discuss preferred location. He currently has transportation issues.

## 2017-09-20 NOTE — Telephone Encounter (Signed)
Pt called requesting an rx for percocet stating that he is having pain. Please advise if rx is appropriate.

## 2017-09-20 NOTE — Telephone Encounter (Signed)
Attempted to call pt. back approx. 10:40 AM; left voice message to call back to discuss his concerns.  Since previous attempt to reach pt, he called back and spoke with an Agent.  The agent reported the pt. Is upset and yelling and swearing; the Agent had difficulty communicating with the pt. due to his belligerence.  A CRM was sent to the office to make them aware of the pt's call, and c/o glucose meter not working properly.

## 2017-09-20 NOTE — Telephone Encounter (Signed)
Please initiate patient dismissal from [facility] due to recurrent foul language and rudeness to staff. I will be happy to sign.

## 2017-09-20 NOTE — Telephone Encounter (Signed)
Copied from [place] [phone]. Topic: Inquiry >> Sep 20, 2017 10:12 AM Conception Chancy, NT wrote: Reason for CRM: this patient is very irate and using very bad foul language. States he was given a broken meter. He hung up before I could get help or any further information. Please advise and contact patient.

## 2017-09-20 NOTE — Telephone Encounter (Signed)
Copied from [place] [phone]. Topic: Quick Communication - Rx Refill/Question >> Sep 20, 2017  8:57 AM Marin Olp L wrote: Medication: the "pricker" in his diabetes equipment is broken Has the patient contacted their pharmacy? Yes.   (Agent: If no, request that the patient contact the pharmacy for the refill.) Preferred Pharmacy (with phone number or street name): [facility] [place] [place] Agent: Please be advised that RX refills may take up to 3 business days. We ask that you follow-up with your pharmacy. Patient says the part to prick his finger is broken on his diabetes supplies. He is highly frustrated with not being show how to use everything. Please contact patient to let him know if it will be filled our to get clarification on what he needs.

## 2017-09-20 NOTE — Telephone Encounter (Signed)
Copied from [place] [phone]. Topic: Quick Communication - See Telephone Encounter >> Sep 20, 2017 11:40 AM Bea Graff, NT wrote: CRM for notification. See Telephone encounter for: Pt calling very upset needing a new machine to be able to check his blood sugar. He states his is broke. [facility] [address]  09/20/17.

## 2017-09-20 NOTE — Telephone Encounter (Signed)
Will not be able to place referral due to [place] not providing his ongoing care. He can call endocrinology to see if they will be willing to see him. Dr. Buddy Duty [phone]. Dismissal letter printed.

## 2017-09-20 NOTE — Telephone Encounter (Signed)
Likely insurance will not pay for another meter. He should ask pharmacy if he can just get another lancet device to check sugar as this is the part he is having problems with.

## 2017-09-20 NOTE — Telephone Encounter (Signed)
Called pt. Unable to reach. Will call back later.

## 2017-09-20 NOTE — Telephone Encounter (Signed)
Cannot give prescription till next month.

## 2017-09-21 DIAGNOSIS — F329 Major depressive disorder, single episode, unspecified: Secondary | ICD-10-CM | POA: Diagnosis not present

## 2017-09-21 DIAGNOSIS — K219 Gastro-esophageal reflux disease without esophagitis: Secondary | ICD-10-CM | POA: Diagnosis not present

## 2017-09-21 DIAGNOSIS — E1165 Type 2 diabetes mellitus with hyperglycemia: Secondary | ICD-10-CM | POA: Diagnosis not present

## 2017-09-21 NOTE — Assessment & Plan Note (Signed)
He is asked again to return for mental health as I feel that his mental health is limiting our ability to care for his physical health. He declines stating that he has been in a lot of mental health and they just write things down about him and he is not currently taking any mental health medications and is actively manic. He is not insightful about his condition. He is reminded that any more rude behavior and we will not be able to care for him at this office anymore.

## 2017-09-21 NOTE — Telephone Encounter (Signed)
Due to repeated verbally abusive calls to staff, pt has been dismissed. I informed pt today 09/21/17 that he will need to seek care elsewhere.  Due to past behavior, pt was warned during his OV 09/06/17 that this could not continue. Pt made repeated calls with abusive language 09/19/17 & 09/20/17.

## 2017-09-21 NOTE — Assessment & Plan Note (Signed)
He has been taking metformin and not checking sugars. Previous HgA1c >10 and added amaryl today. Will need close follow up.

## 2017-09-24 ENCOUNTER — Telehealth: Payer: Self-pay | Admitting: Internal Medicine

## 2017-09-24 NOTE — Telephone Encounter (Signed)
Patient dismissed from [facility] by Pricilla Holm MD , effective September 20, 2017. Dismissal letter sent out by certified / registered mail.  daj

## 2017-09-25 ENCOUNTER — Telehealth: Payer: Self-pay

## 2017-09-25 NOTE — Telephone Encounter (Signed)
Opened in error

## 2017-09-25 NOTE — Telephone Encounter (Signed)
Pt notified that rx will not be filled at this time.

## 2017-09-30 ENCOUNTER — Encounter (HOSPITAL_COMMUNITY): Payer: Self-pay

## 2017-09-30 ENCOUNTER — Emergency Department (HOSPITAL_COMMUNITY)
Admission: EM | Admit: 2017-09-30 | Discharge: 2017-09-30 | Payer: Medicare HMO | Attending: Emergency Medicine | Admitting: Emergency Medicine

## 2017-09-30 ENCOUNTER — Other Ambulatory Visit: Payer: Self-pay

## 2017-09-30 DIAGNOSIS — Z79899 Other long term (current) drug therapy: Secondary | ICD-10-CM | POA: Diagnosis not present

## 2017-09-30 DIAGNOSIS — Z87891 Personal history of nicotine dependence: Secondary | ICD-10-CM | POA: Insufficient documentation

## 2017-09-30 DIAGNOSIS — S0083XA Contusion of other part of head, initial encounter: Secondary | ICD-10-CM | POA: Insufficient documentation

## 2017-09-30 DIAGNOSIS — Z7984 Long term (current) use of oral hypoglycemic drugs: Secondary | ICD-10-CM | POA: Diagnosis not present

## 2017-09-30 DIAGNOSIS — Y9289 Other specified places as the place of occurrence of the external cause: Secondary | ICD-10-CM | POA: Insufficient documentation

## 2017-09-30 DIAGNOSIS — W228XXA Striking against or struck by other objects, initial encounter: Secondary | ICD-10-CM | POA: Insufficient documentation

## 2017-09-30 DIAGNOSIS — Y9389 Activity, other specified: Secondary | ICD-10-CM | POA: Diagnosis not present

## 2017-09-30 DIAGNOSIS — E1110 Type 2 diabetes mellitus with ketoacidosis without coma: Secondary | ICD-10-CM | POA: Diagnosis not present

## 2017-09-30 DIAGNOSIS — Y998 Other external cause status: Secondary | ICD-10-CM | POA: Diagnosis not present

## 2017-09-30 DIAGNOSIS — S0990XA Unspecified injury of head, initial encounter: Secondary | ICD-10-CM | POA: Diagnosis present

## 2017-09-30 NOTE — ED Notes (Signed)
Patient belligerent and cursing at staff calling staff "whore" and "bitch."

## 2017-09-30 NOTE — ED Notes (Signed)
Writer went into room to help pt because pt called out to "spit into something". Writer gave pt an emesis bag. After helping clean spit off pt and his shirt, he began yelling for an attorney and Probation officer informed pt he was still in police custody and an attorney was unavailable at this time due to it being 92. Pt still yelling for an attorney and to be "released". Pt being very disruptive towards staff and pt's in surrounding rooms.

## 2017-09-30 NOTE — ED Provider Notes (Signed)
Antonio Lawson DEPT Provider Note   CSN: [id number] Arrival date & time: 09/30/17  0221     History   Chief Complaint Chief Complaint  Patient presents with  . Head Injury    HPI Antonio Lawson is a 57 y.o. male.  Patient presents to the emergency department for medical clearance at the request of police.  Patient was arrested tonight, but jail refused him because he had banged his head multiple times on the Plexiglas inside the police cruiser.  He reports that he is very angry over being arrested for "nothing".  He has not lost consciousness.  He is refusing any treatments.  Patient agitated, yelling and uncooperative.      Past Medical History:  Diagnosis Date  . Bipolar 1 disorder ([place])   . Chicken pox   . Diverticulitis   . Diverticulosis   . Fatty liver   . GERD (gastroesophageal reflux disease)   . Hyperplastic colon polyp   . Internal hemorrhoids     Patient Active Problem List   Diagnosis Date Noted  . DM (diabetes mellitus) ([place]) 08/30/2017  . Hypocalcemia 08/26/2017  . DKA, type 2 ([place]) 08/25/2017  . Left foot pain 12/18/2016  . Hallux limitus of left foot 11/10/2016  . Osteoarthritis of spine with radiculopathy, lumbosacral region 11/09/2016  . Perianal irritation 06/07/2016  . Incontinence of feces 06/07/2016  . Arthralgia 06/01/2016  . Anxiety state 06/01/2016  . Hemorrhoids 03/28/2016  . GERD (gastroesophageal reflux disease) 03/28/2016  . Anal discharge 03/28/2016  . Sleep disturbance 03/28/2016  . Bipolar I disorder ([place]) 03/28/2016  . Overweight (BMI 25.0-29.9) 03/28/2016  . Tobacco use disorder 03/28/2016    Past Surgical History:  Procedure Laterality Date  . APPENDECTOMY    . HERNIA REPAIR         Home Medications    Prior to Admission medications   Medication Sig Start Date End Date Taking? Authorizing Provider  blood glucose meter kit and supplies Dispense based on patient and insurance  preference. Use up to BID as directed. (FOR ICD-10 E10.9, E11.9). 09/07/17  Yes Hoyt Koch, MD  diclofenac sodium (VOLTAREN) 1 % GEL Apply 2 g topically 4 (four) times daily. Patient taking differently: Apply 2 g topically 4 (four) times daily as needed (pain).  09/06/17  Yes Hoyt Koch, MD  Esomeprazole Magnesium (NEXIUM 24HR) 20 MG TBEC Take 20 mg by mouth daily as needed (for reflux symptoms).   Yes [provider]  glipiZIDE (GLUCOTROL XL) 10 MG 24 hr tablet Take 1 tablet (10 mg total) by mouth daily with breakfast. 09/19/17  Yes Hoyt Koch, MD  metFORMIN (GLUCOPHAGE) 1000 MG tablet Take 1 tablet (1,000 mg total) by mouth 2 (two) times daily with a meal. 09/06/17  Yes Hoyt Koch, MD  oxyCODONE-acetaminophen (PERCOCET) 10-325 MG tablet Take 1 tablet by mouth every 8 (eight) hours as needed for pain.  09/11/17  Yes [provider]  QUEtiapine (SEROQUEL) 100 MG tablet Take 50 mg by mouth at bedtime.    Yes [provider]    Family History Family History  Problem Relation Age of Onset  . Pancreatic cancer Mother   . Heart attack Father   . Stroke Father   . Esophageal cancer Maternal Grandfather     Social History Social History   Tobacco Use  . Smoking status: Former Smoker    Packs/day: 1.00    Types: Cigarettes    Last attempt to quit: 08/18/2017  Years since quitting: 0.1  . Smokeless tobacco: Never Used  Substance Use Topics  . Alcohol use: No  . Drug use: No     Allergies   Patient has no known allergies.   Review of Systems Review of Systems  Unable to perform ROS: Psychiatric disorder     Physical Exam Updated Vital Signs BP (!) 130/92 (BP Location: Right Arm)   Pulse (!) 126   Temp 98.8 F (37.1 C) (Oral)   Resp (!) 22   Ht '6\' 1"'  (1.854 m)   Wt 113.4 kg (250 lb)   SpO2 96%   BMI 32.98 kg/m   Physical Exam  Constitutional: He is oriented to person, place, and time. He appears  well-developed and well-nourished. No distress.  HENT:  Head: Normocephalic. Head is with contusion (Central forehead).  Right Ear: Hearing normal.  Left Ear: Hearing normal.  Nose: Nose normal.  Mouth/Throat: Oropharynx is clear and moist and mucous membranes are normal.  Eyes: Conjunctivae and EOM are normal. Pupils are equal, round, and reactive to light.  Neck: Normal range of motion. Neck supple.  Cardiovascular: Regular rhythm, S1 normal and S2 normal. Exam reveals no gallop and no friction rub.  No murmur heard. Pulmonary/Chest: Effort normal and breath sounds normal. No respiratory distress. He exhibits no tenderness.  Abdominal: Soft. Normal appearance and bowel sounds are normal. There is no hepatosplenomegaly. There is no tenderness. There is no rebound, no guarding, no tenderness at McBurney's point and negative Murphy's sign. No hernia.  Musculoskeletal: Normal range of motion.  Neurological: He is alert and oriented to person, place, and time. He has normal strength. No cranial nerve deficit or sensory deficit. Coordination normal. GCS eye subscore is 4. GCS verbal subscore is 5. GCS motor subscore is 6.  Skin: Skin is warm, dry and intact. No rash noted. No cyanosis.  Psychiatric: He has a normal mood and affect. His speech is normal and behavior is normal. Thought content normal.  Nursing note and vitals reviewed.    ED Treatments / Results  Labs (all labs ordered are listed, but only abnormal results are displayed) Labs Reviewed - No data to display  EKG  EKG Interpretation None       Radiology No results found.  Procedures Procedures (including critical care time)  Medications Ordered in ED Medications - No data to display   Initial Impression / Assessment and Plan / ED Course  I have reviewed the triage vital signs and the nursing notes.  Pertinent labs & imaging results that were available during my care of the patient were reviewed by me and  considered in my medical decision making (see chart for details).     Patient arrives in police custody.  He is agitated and belligerent.  Restraints to prevent self-harm and harm of others.  He is brought here for medical clearance.  The jail reportedly had concern over possible head injury.  Patient has a small contusion on his forehead, but is awake and alert.  He is moving all 4 extremities equally.  I do not believe that the patient requires neuro imaging at this time.  He will be released into police custody.  Final Clinical Impressions(s) / ED Diagnoses   Final diagnoses:  Forehead contusion, initial encounter    ED Discharge Orders    None       Luster Hechler, Gwenyth Allegra, MD 09/30/17 [phone]

## 2017-09-30 NOTE — ED Notes (Signed)
Pt was [facility] his [facility] on the glass [facility] the jail and they wanted him medically cleared, pt is in custody for assault, pt is still in custody

## 2017-09-30 NOTE — ED Notes (Signed)
Patient refusing vital signs and to sign for discharge. EDP and charge nurse aware.

## 2017-10-01 NOTE — Telephone Encounter (Signed)
Received signed domestic return receipt verifying delivery of certified letter on January 25,2019. Article number [id number] [phone]

## 2017-10-10 ENCOUNTER — Ambulatory Visit: Payer: Medicare HMO | Admitting: Internal Medicine

## 2017-10-16 ENCOUNTER — Other Ambulatory Visit: Payer: Self-pay | Admitting: Internal Medicine

## 2017-10-30 ENCOUNTER — Telehealth: Payer: Self-pay | Admitting: *Deleted

## 2017-10-30 ENCOUNTER — Encounter: Payer: Self-pay | Admitting: Podiatry

## 2017-10-30 ENCOUNTER — Ambulatory Visit (INDEPENDENT_AMBULATORY_CARE_PROVIDER_SITE_OTHER): Payer: Medicare HMO | Admitting: Podiatry

## 2017-10-30 DIAGNOSIS — Z9889 Other specified postprocedural states: Secondary | ICD-10-CM

## 2017-10-30 MED ORDER — OXYCODONE-ACETAMINOPHEN 10-325 MG PO TABS: 1.0000 | ORAL_TABLET | Freq: Three times a day (TID) | ORAL | 0 refills | Status: DC | PRN

## 2017-10-30 NOTE — Progress Notes (Signed)
Patient walked in asking prescription for pain medication after he was instructed that we no longer treat chronic pain. Stated that he is on his way moving to [employer].  Advised to seek pain management center when he gets into his destination. Final prescription written.

## 2017-10-30 NOTE — Telephone Encounter (Signed)
Patient notified and scheduled appt for 245 today. Patient said will come at 200

## 2017-10-30 NOTE — Patient Instructions (Signed)
Final prescription written for pain medication. Patient is advised to seek pain management center or another foot doctor to discuss further for foot pain.

## 2017-10-30 NOTE — Telephone Encounter (Signed)
He need to come in be check out. If indicated we may prescribe 7 day supply of pain medication.

## 2017-10-30 NOTE — Telephone Encounter (Signed)
Patient called requesting a refill on percocet. The patient states he needs the medication today because he is moving today. He stated he needed 60 to 90 days worth. Patient was advised that the guidelines have changed as far as the amount and frequency of controlled substances prescribed by podiatrist. A 60 or 90 days supply of medication may not be written today. A seven day supply may be written and he may be offered a  referral to pain management. Advised patient  will send a phone note to provide for her recommendation.

## 2017-11-13 DIAGNOSIS — Z7984 Long term (current) use of oral hypoglycemic drugs: Secondary | ICD-10-CM | POA: Diagnosis not present

## 2017-11-13 DIAGNOSIS — R079 Chest pain, unspecified: Secondary | ICD-10-CM | POA: Diagnosis not present

## 2017-11-13 DIAGNOSIS — R0789 Other chest pain: Secondary | ICD-10-CM | POA: Diagnosis not present

## 2017-11-13 DIAGNOSIS — F17210 Nicotine dependence, cigarettes, uncomplicated: Secondary | ICD-10-CM | POA: Diagnosis not present

## 2017-11-13 DIAGNOSIS — M79672 Pain in left foot: Secondary | ICD-10-CM | POA: Diagnosis not present

## 2017-11-13 DIAGNOSIS — E119 Type 2 diabetes mellitus without complications: Secondary | ICD-10-CM | POA: Diagnosis not present

## 2017-11-13 DIAGNOSIS — I2510 Atherosclerotic heart disease of native coronary artery without angina pectoris: Secondary | ICD-10-CM | POA: Diagnosis not present

## 2017-11-13 DIAGNOSIS — R002 Palpitations: Secondary | ICD-10-CM | POA: Diagnosis not present

## 2017-11-14 DIAGNOSIS — I2510 Atherosclerotic heart disease of native coronary artery without angina pectoris: Secondary | ICD-10-CM | POA: Diagnosis not present

## 2017-11-14 DIAGNOSIS — M21612 Bunion of left foot: Secondary | ICD-10-CM | POA: Diagnosis not present

## 2017-11-14 DIAGNOSIS — I259 Chronic ischemic heart disease, unspecified: Secondary | ICD-10-CM | POA: Diagnosis not present

## 2017-11-14 DIAGNOSIS — E118 Type 2 diabetes mellitus with unspecified complications: Secondary | ICD-10-CM | POA: Diagnosis not present

## 2017-11-21 DIAGNOSIS — F319 Bipolar disorder, unspecified: Secondary | ICD-10-CM | POA: Diagnosis not present

## 2017-12-02 ENCOUNTER — Other Ambulatory Visit: Payer: Self-pay | Admitting: Internal Medicine

## 2017-12-05 ENCOUNTER — Ambulatory Visit: Payer: Medicare HMO | Admitting: Nurse Practitioner

## 2017-12-24 DIAGNOSIS — K644 Residual hemorrhoidal skin tags: Secondary | ICD-10-CM | POA: Diagnosis not present

## 2017-12-24 DIAGNOSIS — M79672 Pain in left foot: Secondary | ICD-10-CM | POA: Diagnosis not present

## 2017-12-24 DIAGNOSIS — K649 Unspecified hemorrhoids: Secondary | ICD-10-CM | POA: Diagnosis not present

## 2018-01-10 DIAGNOSIS — F319 Bipolar disorder, unspecified: Secondary | ICD-10-CM | POA: Diagnosis not present

## 2018-03-13 ENCOUNTER — Telehealth: Payer: Self-pay

## 2018-03-13 NOTE — Telephone Encounter (Signed)
Pt called stating that he requested records be sent to his new podiatrist office a while ago and that the HIM department sent " 73 pages of crap that had nothing to do with feet" to the new office. Pt spoke aggressively demanding records be sent without providing information needed to resolve the problem. Advised pt to have his new office fax [place] a records release and that we would forward it on to the HIM department. Pt verbalized understanding.

## 2019-10-13 ENCOUNTER — Emergency Department (HOSPITAL_COMMUNITY)
Admission: EM | Admit: 2019-10-13 | Discharge: 2019-10-13 | Disposition: A | Discharge status: Home / Self Care | Payer: Medicare HMO | Attending: Emergency Medicine | Admitting: Emergency Medicine

## 2019-10-13 ENCOUNTER — Encounter (HOSPITAL_COMMUNITY): Payer: Self-pay

## 2019-10-13 ENCOUNTER — Other Ambulatory Visit: Payer: Self-pay

## 2019-10-13 ENCOUNTER — Emergency Department (HOSPITAL_COMMUNITY): Payer: Medicare HMO

## 2019-10-13 DIAGNOSIS — M25521 Pain in right elbow: Secondary | ICD-10-CM | POA: Diagnosis not present

## 2019-10-13 DIAGNOSIS — E119 Type 2 diabetes mellitus without complications: Secondary | ICD-10-CM | POA: Diagnosis not present

## 2019-10-13 DIAGNOSIS — M79671 Pain in right foot: Secondary | ICD-10-CM | POA: Insufficient documentation

## 2019-10-13 DIAGNOSIS — M19079 Primary osteoarthritis, unspecified ankle and foot: Secondary | ICD-10-CM | POA: Insufficient documentation

## 2019-10-13 DIAGNOSIS — Z87891 Personal history of nicotine dependence: Secondary | ICD-10-CM | POA: Insufficient documentation

## 2019-10-13 DIAGNOSIS — Z79899 Other long term (current) drug therapy: Secondary | ICD-10-CM | POA: Insufficient documentation

## 2019-10-13 DIAGNOSIS — M25522 Pain in left elbow: Secondary | ICD-10-CM

## 2019-10-13 DIAGNOSIS — M19072 Primary osteoarthritis, left ankle and foot: Secondary | ICD-10-CM | POA: Diagnosis not present

## 2019-10-13 DIAGNOSIS — M79672 Pain in left foot: Secondary | ICD-10-CM | POA: Insufficient documentation

## 2019-10-13 MED ORDER — ACETAMINOPHEN 325 MG PO TABS: 650.0000 mg | ORAL_TABLET | Freq: Once | ORAL | Status: DC

## 2019-10-13 MED ORDER — NAPROXEN 500 MG PO TABS: 500.0000 mg | ORAL_TABLET | Freq: Two times a day (BID) | ORAL | 0 refills | Status: DC

## 2019-10-13 NOTE — Discharge Instructions (Addendum)
You were seen in the emergency department today for foot pain, elbow pain, and back pain.  Your x-rays of your feet showed arthritis to your big toe on both sides.  You also had some additional stress reaction in the left foot.  Please try to rest as much as possible.  Please take naproxen to help with discomfort.  - Naproxen is a nonsteroidal anti-inflammatory medication that will help with pain and swelling. Be sure to take this medication as prescribed with food, 1 pill every 12 hours,  It should be taken with food, as it can cause stomach upset, and more seriously, stomach bleeding. Do not take other nonsteroidal anti-inflammatory medications with this such as Advil, Motrin, Aleve, Mobic, Goodie Powder, or Motrin.     You make take Tylenol per over the counter dosing with these medications.   We have prescribed you new medication(s) today. Discuss the medications prescribed today with your pharmacist as they can have adverse effects and interactions with your other medicines including over the counter and prescribed medications. Seek medical evaluation if you start to experience new or abnormal symptoms after taking one of these medicines, seek care immediately if you start to experience difficulty breathing, feeling of your throat closing, facial swelling, or rash as these could be indications of a more serious allergic reaction  We would like you to follow-up with an orthopedic specialist, please call Dr. Aurea Graff office to make an appointment within 1 week.  Return to the emergency department for new or worsening symptoms including but not limited to worsening pain, numbness, tingling, weakness, open wounds, fever, or any other concerns

## 2019-10-13 NOTE — ED Notes (Signed)
Patient transported to XR. 

## 2019-10-13 NOTE — ED Provider Notes (Signed)
[facility] DEPT Provider Note   CSN: [id number] Arrival date & time: 10/13/19  0856     History Chief Complaint  Patient presents with  . Foot Pain  . Elbow Pain  . Back Pain    Antonio Lawson is a 59 y.o. male with a hx of bipolar disorder, GERD, anxiety, & DM who presents to the ED with complaints of pain in multiple locations for varying periods of time. Patient reports bilateral feet pain- primarily to the 1st ITP joint, occurring for a few years, has had prior bunion surgery to the L foot, states he was told he needed "joint fusion" surgery on both of his 1st toes at some point. He also reports L elbow pain since June when he lifted a refrigerator. Also mentions some right upper back pain which he is unsure how long this has been occurring for. All pain is worse with movement. No alleviating factors. No recent trauma. Denies numbness, tingling, weakness, saddle anesthesia, incontinence to bowel/bladder, fever, chills, IV drug use, dysuria, or hx of cancer. Patient has not had prior back surgeries. Denies chest pain or dyspnea. Denies redness, warmth, wounds or fevers.    HPI     Past Medical History:  Diagnosis Date  . Bipolar 1 disorder ([place])   . Chicken pox   . Diverticulitis   . Diverticulosis   . Fatty liver   . GERD (gastroesophageal reflux disease)   . Hyperplastic colon polyp   . Internal hemorrhoids     Patient Active Problem List   Diagnosis Date Noted  . DM (diabetes mellitus) ([place]) 08/30/2017  . Hypocalcemia 08/26/2017  . DKA, type 2 ([place]) 08/25/2017  . Left foot pain 12/18/2016  . Hallux limitus of left foot 11/10/2016  . Osteoarthritis of spine with radiculopathy, lumbosacral region 11/09/2016  . Perianal irritation 06/07/2016  . Incontinence of feces 06/07/2016  . Arthralgia 06/01/2016  . Anxiety state 06/01/2016  . Hemorrhoids 03/28/2016  . GERD (gastroesophageal reflux disease) 03/28/2016  . Anal discharge  03/28/2016  . Sleep disturbance 03/28/2016  . Bipolar I disorder ([place]) 03/28/2016  . Overweight (BMI 25.0-29.9) 03/28/2016  . Tobacco use disorder 03/28/2016    Past Surgical History:  Procedure Laterality Date  . APPENDECTOMY    . HERNIA REPAIR         Family History  Problem Relation Age of Onset  . Pancreatic cancer Mother   . Heart attack Father   . Stroke Father   . Esophageal cancer Maternal Grandfather     Social History   Tobacco Use  . Smoking status: Former Smoker    Packs/day: 1.00    Types: Cigarettes    Quit date: 08/18/2017    Years since quitting: 2.1  . Smokeless tobacco: Never Used  Substance Use Topics  . Alcohol use: No  . Drug use: No    Home Medications Prior to Admission medications   Medication Sig Start Date End Date Taking? Authorizing Provider  blood glucose meter kit and supplies Dispense based on patient and insurance preference. Use up to BID as directed. (FOR ICD-10 E10.9, E11.9). 09/07/17   Hoyt Koch, MD  diclofenac sodium (VOLTAREN) 1 % GEL Apply 2 g topically 4 (four) times daily. Patient taking differently: Apply 2 g topically 4 (four) times daily as needed (pain).  09/06/17   Hoyt Koch, MD  Esomeprazole Magnesium (NEXIUM 24HR) 20 MG TBEC Take 20 mg by mouth daily as needed (for reflux symptoms).  [provider]  glipiZIDE (GLUCOTROL XL) 10 MG 24 hr tablet TAKE 1 TABLET (10 MG TOTAL) BY MOUTH DAILY WITH BREAKFAST. 10/16/17   Hoyt Koch, MD  metFORMIN (GLUCOPHAGE) 1000 MG tablet Take 1 tablet (1,000 mg total) by mouth 2 (two) times daily with a meal. 09/06/17   Hoyt Koch, MD  naproxen (NAPROSYN) 500 MG tablet Take 1 tablet (500 mg total) by mouth 2 (two) times daily. 10/13/19   Paulette Lynch, Glynda Jaeger, PA-C  oxyCODONE-acetaminophen (PERCOCET) 10-325 MG tablet Take 1 tablet by mouth every 8 (eight) hours as needed for pain. 10/30/17   Sheard, Myeong O, DPM  QUEtiapine (SEROQUEL) 100 MG  tablet Take 50 mg by mouth at bedtime.     [provider]    Allergies    Morphine and related  Review of Systems   Review of Systems Constitutional: Negative for chills, fever and unexpected weight change.  Respiratory: Negative for shortness of breath.  Cardiovascular: Negative for chest pain.  Gastrointestinal: Negative for abdominal pain, nausea and vomiting.  Genitourinary: Negative for dysuria.  Musculoskeletal: Positive for arthralgias and back pain.  Skin: Negative for color change, rash and wound.  Neurological: Negative for weakness and numbness.  Negative for saddle anesthesia or bowel/bladder incontinence.   Physical Exam Updated Vital Signs BP (!) 148/101 (BP Location: Right Arm)   Pulse 85   Temp 98.1 F (36.7 C) (Oral)   Resp 15   Ht '6\' 1"'  (1.854 m)   Wt 81.6 kg   SpO2 96%   BMI 23.75 kg/m   Physical Exam Vitals and nursing note reviewed.  Constitutional:  General: He is not in acute distress. Appearance: He is well-developed. He is not toxic-appearing.  HENT:  Head: Normocephalic and atraumatic.  Eyes:  General:  Right eye: No discharge.  Left eye: No discharge.  Conjunctiva/sclera: Conjunctivae normal.  Cardiovascular:  Rate and Rhythm: Normal rate and regular rhythm.  Comments: 2+ symmetric radial DP and PT pulses bilaterally. Pulmonary:  Effort: Pulmonary effort is normal. No respiratory distress.  Breath sounds: Normal breath sounds. No wheezing, rhonchi or rales.  Abdominal:  General: There is no distension.  Palpations: Abdomen is soft.  Tenderness: There is no abdominal tenderness. There is no guarding or rebound.  Musculoskeletal:  Cervical back: Normal range of motion and neck supple. Muscular tenderness (Right side) present. No spinous process tenderness.  Comments: No obvious deformity, appreciable swelling, erythema, ecchymosis, significant open wounds, or increased warmth.  Back: No midline tenderness palpation. Upper  extremities: Intact active range of motion throughout. Tenderness palpation to the diffuse left posterior elbow. Upper extremities are otherwise nontender. Lower extremities: Intact active range of motion throughout. Tender over the bilateral first MTP joint. Otherwise nontender.  Skin:  General: Skin is warm and dry.  Findings: No rash.  Neurological:  Mental Status: He is alert.  Deep Tendon Reflexes:  Reflex Scores:  Patellar reflexes are 2+ on the right side and 2+ on the left side. Comments: Sensation grossly intact to bilateral upper and lower extremities. 5/5 symmetric strength with grip strength and plantar/dorsiflexion bilaterally. Gait is intact without obvious foot drop.  Psychiatric:  Behavior: Behavior normal.   ED Results / Procedures / Treatments   Labs (all labs ordered are listed, but only abnormal results are displayed) Labs Reviewed - No data to display  EKG None  Radiology DG Elbow Complete Left  Result Date: 10/13/2019 CLINICAL DATA:  Persistent posterior elbow pain since lifting a refrigerator. EXAM: LEFT ELBOW -  COMPLETE 3+ VIEW COMPARISON:  None. FINDINGS: There is no evidence of fracture, dislocation, or joint effusion. There is no evidence of arthropathy or other focal bone abnormality. Soft tissues are unremarkable. IMPRESSION: Negative. Electronically Signed   By: Titus Dubin M.D.   On: 10/13/2019 13:37   DG Foot Complete Left  Result Date: 10/13/2019 CLINICAL DATA:  Diffuse left foot pain. No known injury. EXAM: LEFT FOOT - COMPLETE 3+ VIEW COMPARISON:  Radiographs dated 04/26/2017 and 11/09/2016 FINDINGS: There has been significant progression now severe arthritis of the first MTP joint with increased joint space narrowing and subcortical cyst formation and erosions. There is a new exostosis at the medial aspect of the distal shaft of the first metatarsal. Has the patient had interval surgery at that site? Slight arthritis at the IP joint of the great toe.  The patient has new cortical thickening of the midshaft of the second metatarsal consistent with a stress reaction. No discrete stress fracture. IMPRESSION: 1. 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 2. New stress reaction of the second metatarsal. 3. New exostosis at the medial aspect of the distal shaft of the first metatarsal versus postsurgical periosteal reaction if the patient has had surgery at that site. Electronically Signed   By: Lorriane Shire M.D.   On: 10/13/2019 13:38   DG Foot Complete Right  Result Date: 10/13/2019 CLINICAL DATA:  Pain. EXAM: RIGHT FOOT COMPLETE - 3+ VIEW COMPARISON:  None. FINDINGS: There is moderate arthritis of the first MTP joint. No fracture or dislocation. The patient has an os navicular, an anatomic variant with degenerative changes at that pseudoarticulation. IMPRESSION: No acute abnormality. Arthritic changes of the first MTP joint. Electronically Signed   By: Lorriane Shire M.D.   On: 10/13/2019 14:18    Procedures Procedures (including critical care time)  Medications Ordered in ED Medications - No data to display  ED Course  I have reviewed the triage vital signs and the nursing notes.  Pertinent labs & imaging results that were available during my care of the patient were reviewed by me and considered in my medical decision making (see chart for details).    MDM Rules/Calculators/A&P                      Patient presents to the emergency department with complaints of pain in multiple locations for variable durations of time.  Nontoxic, resting comfortably, vitals WNL with exception of elevated blood pressure, doubt HTN emergency.  No signs of infection, no erythema, no warmth, no fever, not consistent with septic joint, cellulitis, septic bursitis, osteomyelitis, or epidural abscess. Xray of elbow negative, xray of right foot w/o acute abnormality- arthritic changes of the 1st MTP Joint. L foot xray with progressive arthritis of MTP, new  stress reaction to 2nd metatarsal, exostosis @ first metatarsal vs. Post surgical changes- patient has had prior surgery in this location. No significant acute process. R back pain is reproducible with trapezius muscle palpation.  Neurovascularly intact distal to all areas of pain. Will trial naproxen, chart reviewed, last creatinine WNL, orthopedics follow up. I discussed results, treatment plan, need for follow-up, and return precautions with the patient. Provided opportunity for questions, patient confirmed understanding and is in agreement with plan.   Findings and plan of care discussed with supervising physician Dr. Stark Jock who is in agreement.    Final Clinical Impression(s) / ED Diagnoses Final diagnoses:  Foot pain, bilateral  Left elbow pain  1st MTP arthritis  Rx / DC Orders ED Discharge Orders         Ordered    naproxen (NAPROSYN) 500 MG tablet  2 times daily     10/13/19 1458           Xoey Warmoth, Glynda Jaeger, PA-C 10/13/19 1506    Veryl Speak, MD 10/14/19 1626

## 2019-10-13 NOTE — ED Notes (Signed)
Patient removed monitors prior to completion of obtaining full set of vital signs.

## 2019-10-13 NOTE — ED Triage Notes (Signed)
Patient c/o bilateral foot pain. Patient states he was suppose to have surgery last year in [place], but Covid came and did not have the surgery. patient also c/o left elbow pain. Patient states he was lifting a refrigerator and is now having pain. Patient has a 3rd pain in his upper back. Patient states he went to a massage therapist and now his upper back pain is worst.

## 2019-10-13 NOTE — ED Notes (Signed)
Patient requesting narcotic pain medication to treat pain. PA at bedside reviewing prescriptions with patient. Patient continues to request additional pain medications while refusing tylenol ordered. Patient ambulatory out of department without difficulty.

## 2019-11-20 DIAGNOSIS — M79671 Pain in right foot: Secondary | ICD-10-CM | POA: Diagnosis not present

## 2019-11-20 DIAGNOSIS — M2022 Hallux rigidus, left foot: Secondary | ICD-10-CM | POA: Diagnosis not present

## 2019-11-20 DIAGNOSIS — M2021 Hallux rigidus, right foot: Secondary | ICD-10-CM | POA: Diagnosis not present

## 2019-11-20 DIAGNOSIS — M79672 Pain in left foot: Secondary | ICD-10-CM | POA: Diagnosis not present

## 2020-02-16 ENCOUNTER — Emergency Department (HOSPITAL_COMMUNITY)
Admission: EM | Admit: 2020-02-16 | Discharge: 2020-02-16 | Disposition: A | Discharge status: Home / Self Care | Payer: Medicare HMO | Attending: Emergency Medicine | Admitting: Emergency Medicine

## 2020-02-16 ENCOUNTER — Encounter (HOSPITAL_COMMUNITY): Payer: Self-pay | Admitting: Emergency Medicine

## 2020-02-16 ENCOUNTER — Emergency Department (HOSPITAL_COMMUNITY): Payer: Medicare HMO

## 2020-02-16 DIAGNOSIS — W108XXA Fall (on) (from) other stairs and steps, initial encounter: Secondary | ICD-10-CM | POA: Diagnosis not present

## 2020-02-16 DIAGNOSIS — M25521 Pain in right elbow: Secondary | ICD-10-CM | POA: Insufficient documentation

## 2020-02-16 DIAGNOSIS — Y999 Unspecified external cause status: Secondary | ICD-10-CM | POA: Insufficient documentation

## 2020-02-16 DIAGNOSIS — Y9389 Activity, other specified: Secondary | ICD-10-CM | POA: Diagnosis not present

## 2020-02-16 DIAGNOSIS — Y929 Unspecified place or not applicable: Secondary | ICD-10-CM | POA: Diagnosis not present

## 2020-02-16 DIAGNOSIS — Z7984 Long term (current) use of oral hypoglycemic drugs: Secondary | ICD-10-CM | POA: Diagnosis not present

## 2020-02-16 DIAGNOSIS — E119 Type 2 diabetes mellitus without complications: Secondary | ICD-10-CM | POA: Diagnosis not present

## 2020-02-16 DIAGNOSIS — R339 Retention of urine, unspecified: Secondary | ICD-10-CM | POA: Insufficient documentation

## 2020-02-16 DIAGNOSIS — M545 Low back pain, unspecified: Secondary | ICD-10-CM

## 2020-02-16 DIAGNOSIS — R1011 Right upper quadrant pain: Secondary | ICD-10-CM | POA: Insufficient documentation

## 2020-02-16 DIAGNOSIS — M549 Dorsalgia, unspecified: Secondary | ICD-10-CM

## 2020-02-16 LAB — URINALYSIS, ROUTINE W REFLEX MICROSCOPIC
Bilirubin Urine: NEGATIVE
Glucose, UA: NEGATIVE mg/dL
Ketones, ur: NEGATIVE mg/dL
Nitrite: NEGATIVE
Protein, ur: NEGATIVE mg/dL
Specific Gravity, Urine: 1.013 (ref 1.005–1.030)
WBC, UA: >50 WBC/hpf — ABNORMAL HIGH (ref 0–5)
pH: 5 (ref 5.0–8.0)

## 2020-02-16 LAB — I-STAT CHEM 8, ED
BUN: 17 mg/dL (ref 6–20)
BUN: 13 mg/dL (ref 6–20)
Calcium, Ion: 1.09 mmol/L — ABNORMAL LOW (ref 1.15–1.40)
Calcium, Ion: 1.16 mmol/L (ref 1.15–1.40)
Chloride: 103 mmol/L (ref 98–111)
Chloride: 101 mmol/L (ref 98–111)
Creatinine, Ser: 1 mg/dL (ref 0.61–1.24)
Creatinine, Ser: 0.9 mg/dL (ref 0.61–1.24)
Glucose, Bld: 136 mg/dL — ABNORMAL HIGH (ref 70–99)
Glucose, Bld: 129 mg/dL — ABNORMAL HIGH (ref 70–99)
HCT: 44 % (ref 39.0–52.0)
HCT: 44 % (ref 39.0–52.0)
Hemoglobin: 15 g/dL (ref 13.0–17.0)
Hemoglobin: 15 g/dL (ref 13.0–17.0)
Potassium: 6.1 mmol/L — ABNORMAL HIGH (ref 3.5–5.1)
Potassium: 3.6 mmol/L (ref 3.5–5.1)
Sodium: 138 mmol/L (ref 135–145)
Sodium: 141 mmol/L (ref 135–145)
TCO2: 26 mmol/L (ref 22–32)
TCO2: 27 mmol/L (ref 22–32)

## 2020-02-16 LAB — URINALYSIS, MICROSCOPIC (REFLEX)
Squamous Epithelial / HPF: NONE SEEN (ref 0–5)
WBC, UA: >50 WBC/hpf (ref 0–5)

## 2020-02-16 MED ORDER — IOHEXOL 300 MG/ML  SOLN
100.0000 mL | Freq: Once | INTRAMUSCULAR | Status: AC | PRN
  Administered 2020-02-16: 100 mL via INTRAVENOUS

## 2020-02-16 MED ORDER — FENTANYL CITRATE (PF) 100 MCG/2ML IJ SOLN
50.0000 ug | Freq: Once | INTRAMUSCULAR | Status: AC
  Administered 2020-02-16: 50 ug via INTRAVENOUS
  Filled 2020-02-16: qty 2

## 2020-02-16 MED ORDER — METHOCARBAMOL 500 MG PO TABS
500.0000 mg | ORAL_TABLET | Freq: Two times a day (BID) | ORAL | 0 refills | Status: DC
Start: 2020-02-16 — End: 2020-04-28

## 2020-02-16 MED ORDER — ONDANSETRON HCL 4 MG/2ML IJ SOLN
4.0000 mg | Freq: Once | INTRAMUSCULAR | Status: AC
  Administered 2020-02-16: 4 mg via INTRAVENOUS
  Filled 2020-02-16: qty 2

## 2020-02-16 NOTE — ED Provider Notes (Signed)
Pt's care asummed from [facility] [place] at 4pm.  Ct abdomen and low back pending.   Results for orders placed or performed during the hospital encounter of 02/16/20  Urinalysis, Routine w reflex microscopic  Result Value Ref Range   Color, Urine YELLOW YELLOW   APPearance HAZY (A) CLEAR   Specific Gravity, Urine 1.013 1.005 - 1.030   pH 5.0 5.0 - 8.0   Glucose, UA NEGATIVE NEGATIVE mg/dL   Hgb urine dipstick MODERATE (A) NEGATIVE   Bilirubin Urine NEGATIVE NEGATIVE   Ketones, ur NEGATIVE NEGATIVE mg/dL   Protein, ur NEGATIVE NEGATIVE mg/dL   Nitrite NEGATIVE NEGATIVE   Leukocytes,Ua LARGE (A) NEGATIVE   RBC / HPF 6-10 0 - 5 RBC/hpf   WBC, UA >50 (H) 0 - 5 WBC/hpf   Bacteria, UA RARE (A) NONE SEEN   Mucus PRESENT   Urinalysis, Microscopic (reflex)  Result Value Ref Range   RBC / HPF 6-10 0 - 5 RBC/hpf   WBC, UA >50 0 - 5 WBC/hpf   Bacteria, UA RARE (A) NONE SEEN   Squamous Epithelial / LPF NONE SEEN 0 - 5   Mucus PRESENT   I-stat chem 8, ED (not at [facility] or [facility])  Result Value Ref Range   Sodium 138 135 - 145 mmol/L   Potassium 6.1 (H) 3.5 - 5.1 mmol/L   Chloride 103 98 - 111 mmol/L   BUN 17 6 - 20 mg/dL   Creatinine, Ser 1.00 0.61 - 1.24 mg/dL   Glucose, Bld 136 (H) 70 - 99 mg/dL   Calcium, Ion 1.09 (L) 1.15 - 1.40 mmol/L   TCO2 26 22 - 32 mmol/L   Hemoglobin 15.0 13.0 - 17.0 g/dL   HCT 44.0 39 - 52 %  I-stat chem 8, ED (not at [facility] or [facility])  Result Value Ref Range   Sodium 141 135 - 145 mmol/L   Potassium 3.6 3.5 - 5.1 mmol/L   Chloride 101 98 - 111 mmol/L   BUN 13 6 - 20 mg/dL   Creatinine, Ser 0.90 0.61 - 1.24 mg/dL   Glucose, Bld 129 (H) 70 - 99 mg/dL   Calcium, Ion 1.16 1.15 - 1.40 mmol/L   TCO2 27 22 - 32 mmol/L   Hemoglobin 15.0 13.0 - 17.0 g/dL   HCT 44.0 39 - 52 %   DG Elbow Complete Right  Result Date: 02/16/2020 CLINICAL DATA:  RIGHT elbow pain, low back pain this morning while moving television EXAM: RIGHT ELBOW - COMPLETE 3+ VIEW COMPARISON:  None FINDINGS:  There is no evidence of fracture, dislocation, or joint effusion. There is no evidence of arthropathy or other focal bone abnormality. Soft tissues are unremarkable. IMPRESSION: Negative evaluation of the RIGHT elbow. Electronically Signed   By: Zetta Bills M.D.   On: 02/16/2020 10:24   CT ABDOMEN PELVIS W CONTRAST  Result Date: 02/16/2020 CLINICAL DATA:  Flank pain after trauma. EXAM: CT ABDOMEN AND PELVIS WITH CONTRAST TECHNIQUE: Multidetector CT imaging of the abdomen and pelvis was performed using the standard protocol following bolus administration of intravenous contrast. CONTRAST:  151mL OMNIPAQUE IOHEXOL 300 MG/ML  SOLN COMPARISON:  August 17, 2015. FINDINGS: Lower chest: No acute abnormality. Hepatobiliary: No gallstones or biliary dilatation is noted. Hepatic steatosis is noted. Pancreas: Unremarkable. No pancreatic ductal dilatation or surrounding inflammatory changes. Spleen: Normal in size without focal abnormality. Adrenals/Urinary Tract: Adrenal glands are unremarkable. Kidneys are normal, without renal calculi, focal lesion, or hydronephrosis. Bladder is unremarkable. Stomach/Bowel: The stomach appears normal.  Status post appendectomy. No evidence of bowel obstruction or inflammation. Sigmoid diverticulosis is noted without inflammation. Vascular/Lymphatic: Aortic atherosclerosis. No enlarged abdominal or pelvic lymph nodes. Reproductive: Prostate is unremarkable. Other: No abdominal wall hernia or abnormality. No abdominopelvic ascites. Musculoskeletal: No acute or significant osseous findings. IMPRESSION: 1. Hepatic steatosis. 2. Sigmoid diverticulosis without inflammation. 3. No acute abnormality seen in the abdomen or pelvis. Aortic Atherosclerosis (ICD10-I70.0). Electronically Signed   By: Marijo Conception M.D.   On: 02/16/2020 17:27   CT L-SPINE NO CHARGE  Result Date: 02/16/2020 CLINICAL DATA:  Fall.  Back pain EXAM: CT LUMBAR SPINE WITHOUT CONTRAST TECHNIQUE: Multidetector CT  imaging of the lumbar spine was performed without intravenous contrast administration. Multiplanar CT image reconstructions were also generated. COMPARISON:  Lumbar radiographs 11/09/2016. CT abdomen pelvis 08/17/2015 FINDINGS: Segmentation: Normal Alignment: Slight retrolisthesis L2-3 and L3-4 and L4-5 Vertebrae: Mild superior endplate fracture of L4 is chronic and unchanged from 2016 CT. No acute fracture or mass. Paraspinal and other soft tissues: CT abdomen pelvis reported separately. Disc levels: L1-2: Mild disc degeneration.  Negative for stenosis L2-3: Negative L3-4: Mild disc bulging and mild facet degeneration. Negative for stenosis. L4-5: Mild disc bulging and mild facet degeneration. Negative for stenosis. L5-S1: Asymmetric facet degeneration on the right. Mild subarticular stenosis bilaterally. IMPRESSION: Negative for acute fracture. Mild chronic fracture superior endplate of L4. Electronically Signed   By: Franchot Gallo M.D.   On: 02/16/2020 17:25   Xrays reviewed no acute injury.  Pt counseled on results  An After Visit Summary was printed and given to the patient.    Fransico Meadow, [place] 02/16/20 1754    Little, Wenda Overland, MD 02/16/20 [phone]

## 2020-02-16 NOTE — ED Triage Notes (Signed)
EMS stated, this am at 0400 this morning and fell down steps and the TV he was carrying fell on him and is hurting rt. Flank area and rt. Elbow area. He has not been able to urinate since this am.  He has chronic foot issue and takes [place] and doesn't take him cause it makes him sick on his stomach.

## 2020-02-16 NOTE — ED Provider Notes (Signed)
[facility] EMERGENCY DEPARTMENT Provider Note   CSN: [id number] Arrival date & time: 02/16/20  0913     History Chief Complaint  Patient presents with  . Fall  . Elbow Pain  . Urinary Retention    Antonio Lawson is a 59 y.o. male past med history of bipolar, diverticulitis, GERD, who presents for evaluation of lower back and right flank pain that began after mechanical fall that occurred this morning at 4 AM.  He reports that he was helping someone move a TV about 200 pounds.  He states he put it on a blanket and was pulling it down the steps.  He states that he got to an area where he had about 7 steps left and he fell and then the TV slid and hit him on the right side, right flank and right back.  He denies any head injury, LOC.  He has been able to ambulate since this happened.  He reports that since then, he has had pain in his lower back, right flank area.  He reports that initially this morning, he felt like he was having difficulty urinating.  He states that he drank Coke as well as coffee and states that he felt like he should have had to go to the bathroom but states he did not have to go.  He did not take anything for his pain.  He reports that his pain is worse with movement.  It does not radiate.  He was able to urinate here in the emergency department and states no pain, hematuria noted.  He denies any chest pain, abdominal pain, numbness/weakness of his arms or legs, shortness of breath, dizziness.  He is not on blood thinners.  He also reports pain in his right elbow.  He has had pain in his elbow for several months and is currently taking meloxicam.  He thinks he may have hit it during the incident today. Denies fevers, weight loss, numbness/weakness of upper and lower extremities, bowel/bladder incontinence, saddle anesthesia, history of back surgery, history of IVDA.    The history is provided by the patient.       Past Medical History:  Diagnosis  Date  . Bipolar 1 disorder ([place])   . Chicken pox   . Diverticulitis   . Diverticulosis   . Fatty liver   . GERD (gastroesophageal reflux disease)   . Hyperplastic colon polyp   . Internal hemorrhoids     Patient Active Problem List   Diagnosis Date Noted  . DM (diabetes mellitus) ([place]) 08/30/2017  . Hypocalcemia 08/26/2017  . DKA, type 2 ([place]) 08/25/2017  . Left foot pain 12/18/2016  . Hallux limitus of left foot 11/10/2016  . Osteoarthritis of spine with radiculopathy, lumbosacral region 11/09/2016  . Perianal irritation 06/07/2016  . Incontinence of feces 06/07/2016  . Arthralgia 06/01/2016  . Anxiety state 06/01/2016  . Hemorrhoids 03/28/2016  . GERD (gastroesophageal reflux disease) 03/28/2016  . Anal discharge 03/28/2016  . Sleep disturbance 03/28/2016  . Bipolar I disorder ([place]) 03/28/2016  . Overweight (BMI 25.0-29.9) 03/28/2016  . Tobacco use disorder 03/28/2016    Past Surgical History:  Procedure Laterality Date  . APPENDECTOMY    . HERNIA REPAIR         Family History  Problem Relation Age of Onset  . Pancreatic cancer Mother   . Heart attack Father   . Stroke Father   . Esophageal cancer Maternal Grandfather     Social History  Tobacco Use  . Smoking status: Former Smoker    Packs/day: 1.00    Types: Cigarettes    Quit date: 08/18/2017    Years since quitting: 2.4  . Smokeless tobacco: Never Used  Vaping Use  . Vaping Use: Never used  Substance Use Topics  . Alcohol use: No  . Drug use: No    Home Medications Prior to Admission medications   Medication Sig Start Date End Date Taking? Authorizing Provider  blood glucose meter kit and supplies Dispense based on patient and insurance preference. Use up to BID as directed. (FOR ICD-10 E10.9, E11.9). Patient not taking: Reported on 02/16/2020 09/07/17   Hoyt Koch, MD  diclofenac sodium (VOLTAREN) 1 % GEL Apply 2 g topically 4 (four) times daily. Patient not taking: Reported on  02/16/2020 09/06/17   Hoyt Koch, MD  Esomeprazole Magnesium (NEXIUM 24HR) 20 MG TBEC Take 20 mg by mouth daily as needed (for reflux symptoms). Patient not taking: Reported on 02/16/2020    [provider]  glipiZIDE (GLUCOTROL XL) 10 MG 24 hr tablet TAKE 1 TABLET (10 MG TOTAL) BY MOUTH DAILY WITH BREAKFAST. Patient not taking: Reported on 02/16/2020 10/16/17   Hoyt Koch, MD  metFORMIN (GLUCOPHAGE) 1000 MG tablet Take 1 tablet (1,000 mg total) by mouth 2 (two) times daily with a meal. Patient not taking: Reported on 02/16/2020 09/06/17   Hoyt Koch, MD  methocarbamol (ROBAXIN) 500 MG tablet Take 1 tablet (500 mg total) by mouth 2 (two) times daily. 02/16/20   Volanda Napoleon, PA-C  naproxen (NAPROSYN) 500 MG tablet Take 1 tablet (500 mg total) by mouth 2 (two) times daily. Patient not taking: Reported on 02/16/2020 10/13/19   Petrucelli, Glynda Jaeger, PA-C  oxyCODONE-acetaminophen (PERCOCET) 10-325 MG tablet Take 1 tablet by mouth every 8 (eight) hours as needed for pain. Patient not taking: Reported on 02/16/2020 10/30/17   Sheard, Myeong O, DPM  QUEtiapine (SEROQUEL) 100 MG tablet Take 50 mg by mouth at bedtime.  Patient not taking: Reported on 02/16/2020    [provider]    Allergies    Morphine and related  Review of Systems   Review of Systems  Respiratory: Negative for cough and shortness of breath.   Cardiovascular: Negative for chest pain.  Gastrointestinal: Negative for abdominal pain, nausea and vomiting.  Genitourinary: Positive for flank pain. Negative for dysuria and hematuria.  Musculoskeletal: Positive for back pain.  Neurological: Negative for headaches.  All other systems reviewed and are negative.   Physical Exam Updated Vital Signs BP 122/82   Pulse 65   Temp 98.4 F (36.9 C) (Oral)   Resp 20   Ht _0  (1.88 m)   Wt 98.4 kg   SpO2 94%   BMI 27.86 kg/m   Physical Exam Vitals and nursing note reviewed.    Constitutional:      Appearance: Normal appearance. He is well-developed.  HENT:     Head: Normocephalic and atraumatic.  Eyes:     General: Lids are normal.     Conjunctiva/sclera: Conjunctivae normal.     Pupils: Pupils are equal, round, and reactive to light.  Neck:     Comments: Full flexion/extension and lateral movement of neck fully intact. No bony midline tenderness. No deformities or crepitus.  Cardiovascular:     Rate and Rhythm: Normal rate and regular rhythm.     Pulses: Normal pulses.     Heart sounds: Normal heart sounds. No murmur heard.  No friction rub. No gallop.   Pulmonary:     Effort: Pulmonary effort is normal.     Breath sounds: Normal breath sounds.     Comments: Lungs clear to auscultation bilaterally.  Symmetric chest rise.  No wheezing, rales, rhonchi. Abdominal:     Palpations: Abdomen is soft. Abdomen is not rigid.     Tenderness: There is no abdominal tenderness. There is no guarding.     Comments: Abdomen is soft, non-distended, non-tender. No rigidity, No guarding. No peritoneal signs.  Musculoskeletal:        General: Normal range of motion.     Cervical back: Full passive range of motion without pain.       Back:     Comments: No midline T-spine tenderness.  Tenderness palpation of the lower midline lumbar spine.  No deformity or crepitus noted.  This extends to the paraspinal muscles of the right lumbar region and into the flank.  No overlying ecchymosis, edema.  No pelvic instability.  Skin:    General: Skin is warm and dry.     Capillary Refill: Capillary refill takes less than 2 seconds.  Neurological:     Mental Status: He is alert and oriented to person, place, and time.     Comments: Follows commands, Moves all extremities  5/5 strength to BUE and BLE  Sensation intact throughout all major nerve distributions  Psychiatric:        Speech: Speech normal.     ED Results / Procedures / Treatments   Labs (all labs ordered are listed,  but only abnormal results are displayed) Labs Reviewed  URINALYSIS, ROUTINE W REFLEX MICROSCOPIC - Abnormal; Notable for the following components:      Result Value   APPearance HAZY (*)    Hgb urine dipstick MODERATE (*)    Leukocytes,Ua LARGE (*)    WBC, UA >50 (*)    Bacteria, UA RARE (*)    All other components within normal limits  URINALYSIS, MICROSCOPIC (REFLEX) - Abnormal; Notable for the following components:   Bacteria, UA RARE (*)    All other components within normal limits  I-STAT CHEM 8, ED - Abnormal; Notable for the following components:   Potassium 6.1 (*)    Glucose, Bld 136 (*)    Calcium, Ion 1.09 (*)    All other components within normal limits  I-STAT CHEM 8, ED - Abnormal; Notable for the following components:   Glucose, Bld 129 (*)    All other components within normal limits    EKG None  Radiology DG Elbow Complete Right  Result Date: 02/16/2020 CLINICAL DATA:  RIGHT elbow pain, low back pain this morning while moving television EXAM: RIGHT ELBOW - COMPLETE 3+ VIEW COMPARISON:  None FINDINGS: There is no evidence of fracture, dislocation, or joint effusion. There is no evidence of arthropathy or other focal bone abnormality. Soft tissues are unremarkable. IMPRESSION: Negative evaluation of the RIGHT elbow. Electronically Signed   By: Zetta Bills M.D.   On: 02/16/2020 10:24    Procedures Procedures (including critical care time)  Medications Ordered in ED Medications  fentaNYL (SUBLIMAZE) injection 50 mcg (50 mcg Intravenous Given 02/16/20 1527)  ondansetron (ZOFRAN) injection 4 mg (4 mg Intravenous Given 02/16/20 1527)    ED Course  I have reviewed the triage vital signs and the nursing notes.  Pertinent labs & imaging results that were available during my care of the patient were reviewed by me and considered in my medical decision  making (see chart for details).    MDM Rules/Calculators/A&P                          59 year old male who  presents for evaluation of lower back pain/flank pain that began this morning.  Reports he was moving a TV downstairs and states that he slipped and fell, landing on his back and the TV hit him on his right side.  Since then he has had pain to his lower back and flank area.  He states that this morning, he felt like he was having trouble urinating.  He states that he drank a bunch of fluids and felt like he did not have to go the bathroom which concerned him.  He was able to urinate here in the ED and did not have any difficulty or pain.  On initial arrival, he is afebrile, nontoxic-appearing.  Vital signs are stable.  On exam, he does have midline lower lumbar tenderness as well as some tenderness noted to the right flank.  No bruising.  Urine collected.  Will obtain imaging of his back and flank given urinary complaints.   I-STAT Chem-8 shows potassium of 6.1, creatinine of 1, BUN is 17.  UA shows moderate hemoglobin, large leukocytes. Will repeat chem-8 to evaluate potassium.   Patient signed out to Caryl Ada, PA-C pending imaging. If normal imaging, plan for discharge with muscle relaxer.   Portions of this note were generated with [employer]. Dictation errors may occur despite best attempts at proofreading.  Final Clinical Impression(s) / ED Diagnoses Final diagnoses:  Back pain  Acute right-sided low back pain without sciatica    Rx / DC Orders ED Discharge Orders         Ordered    methocarbamol (ROBAXIN) 500 MG tablet  2 times daily     Discontinue  Reprint     02/16/20 1504           Volanda Napoleon, PA-C 02/16/20 1702    Lacretia Leigh, MD 02/17/20 [phone]

## 2020-02-16 NOTE — Discharge Instructions (Signed)
You can take Tylenol or Ibuprofen as directed for pain. You can alternate Tylenol and Ibuprofen every 4 hours. If you take Tylenol at 1pm, then you can take Ibuprofen at 5pm. Then you can take Tylenol again at 9pm.   Take Robaxin as prescribed. This medication will make you drowsy so do not drive or drink alcohol when taking it.  Follow-up with [facility] to establish a primary care doctor if you do not have one.   Return to the Emergency Department immediately for any worsening back pain, neck pain, difficulty walking, numbness/weaknss of your arms or legs, urinary or bowel accidents, fever or any other worsening or concerning symptoms.

## 2020-03-01 ENCOUNTER — Other Ambulatory Visit (HOSPITAL_COMMUNITY): Payer: Self-pay | Admitting: Orthopedic Surgery

## 2020-03-03 ENCOUNTER — Encounter (HOSPITAL_BASED_OUTPATIENT_CLINIC_OR_DEPARTMENT_OTHER): Payer: Self-pay | Admitting: Orthopedic Surgery

## 2020-03-03 ENCOUNTER — Other Ambulatory Visit: Payer: Self-pay

## 2020-03-09 ENCOUNTER — Other Ambulatory Visit (HOSPITAL_COMMUNITY)
Admission: RE | Admit: 2020-03-09 | Discharge: 2020-03-09 | Disposition: A | Discharge status: Home / Self Care | Payer: Medicare HMO | Source: Ambulatory Visit | Attending: Orthopedic Surgery | Admitting: Orthopedic Surgery

## 2020-03-09 ENCOUNTER — Other Ambulatory Visit: Payer: Self-pay

## 2020-03-09 ENCOUNTER — Encounter (HOSPITAL_BASED_OUTPATIENT_CLINIC_OR_DEPARTMENT_OTHER)
Admission: RE | Admit: 2020-03-09 | Discharge: 2020-03-09 | Disposition: A | Discharge status: Home / Self Care | Payer: Medicare HMO | Source: Ambulatory Visit | Attending: Orthopedic Surgery | Admitting: Orthopedic Surgery

## 2020-03-09 DIAGNOSIS — Z20822 Contact with and (suspected) exposure to covid-19: Secondary | ICD-10-CM | POA: Insufficient documentation

## 2020-03-09 DIAGNOSIS — Z01812 Encounter for preprocedural laboratory examination: Secondary | ICD-10-CM | POA: Insufficient documentation

## 2020-03-09 LAB — BASIC METABOLIC PANEL
Anion gap: 10 (ref 5–15)
BUN: 14 mg/dL (ref 6–20)
CO2: 21 mmol/L — ABNORMAL LOW (ref 22–32)
Calcium: 9.3 mg/dL (ref 8.9–10.3)
Chloride: 106 mmol/L (ref 98–111)
Creatinine, Ser: 0.88 mg/dL (ref 0.61–1.24)
GFR calc Af Amer: >60 mL/min (ref >60)
GFR calc non Af Amer: >60 mL/min (ref >60)
Glucose, Bld: 253 mg/dL — ABNORMAL HIGH (ref 70–99)
Potassium: 4.2 mmol/L (ref 3.5–5.1)
Sodium: 137 mmol/L (ref 135–145)

## 2020-03-09 LAB — SARS CORONAVIRUS 2 (TAT 6-24 HRS): SARS Coronavirus 2: NEGATIVE

## 2020-03-09 NOTE — Progress Notes (Signed)

## 2020-03-11 ENCOUNTER — Encounter (HOSPITAL_BASED_OUTPATIENT_CLINIC_OR_DEPARTMENT_OTHER): Payer: Self-pay | Admitting: Orthopedic Surgery

## 2020-03-11 ENCOUNTER — Ambulatory Visit (HOSPITAL_BASED_OUTPATIENT_CLINIC_OR_DEPARTMENT_OTHER)
Admission: RE | Admit: 2020-03-11 | Discharge: 2020-03-11 | Disposition: A | Discharge status: Home / Self Care | Payer: Medicare HMO | Attending: Orthopedic Surgery | Admitting: Orthopedic Surgery

## 2020-03-11 ENCOUNTER — Ambulatory Visit (HOSPITAL_BASED_OUTPATIENT_CLINIC_OR_DEPARTMENT_OTHER): Payer: Medicare HMO | Admitting: Certified Registered"

## 2020-03-11 ENCOUNTER — Encounter (HOSPITAL_BASED_OUTPATIENT_CLINIC_OR_DEPARTMENT_OTHER)
Admission: RE | Disposition: A | Discharge status: Home / Self Care | Payer: Self-pay | Source: Home / Self Care | Attending: Orthopedic Surgery

## 2020-03-11 DIAGNOSIS — Z7984 Long term (current) use of oral hypoglycemic drugs: Secondary | ICD-10-CM | POA: Insufficient documentation

## 2020-03-11 DIAGNOSIS — Z8379 Family history of other diseases of the digestive system: Secondary | ICD-10-CM | POA: Diagnosis not present

## 2020-03-11 DIAGNOSIS — Z885 Allergy status to narcotic agent status: Secondary | ICD-10-CM | POA: Insufficient documentation

## 2020-03-11 DIAGNOSIS — Z823 Family history of stroke: Secondary | ICD-10-CM | POA: Diagnosis not present

## 2020-03-11 DIAGNOSIS — F17210 Nicotine dependence, cigarettes, uncomplicated: Secondary | ICD-10-CM | POA: Diagnosis not present

## 2020-03-11 DIAGNOSIS — Z791 Long term (current) use of non-steroidal anti-inflammatories (NSAID): Secondary | ICD-10-CM | POA: Diagnosis not present

## 2020-03-11 DIAGNOSIS — K5790 Diverticulosis of intestine, part unspecified, without perforation or abscess without bleeding: Secondary | ICD-10-CM | POA: Diagnosis not present

## 2020-03-11 DIAGNOSIS — F319 Bipolar disorder, unspecified: Secondary | ICD-10-CM | POA: Insufficient documentation

## 2020-03-11 DIAGNOSIS — Z8249 Family history of ischemic heart disease and other diseases of the circulatory system: Secondary | ICD-10-CM | POA: Insufficient documentation

## 2020-03-11 DIAGNOSIS — M25775 Osteophyte, left foot: Secondary | ICD-10-CM | POA: Diagnosis not present

## 2020-03-11 DIAGNOSIS — E119 Type 2 diabetes mellitus without complications: Secondary | ICD-10-CM | POA: Insufficient documentation

## 2020-03-11 DIAGNOSIS — K760 Fatty (change of) liver, not elsewhere classified: Secondary | ICD-10-CM | POA: Diagnosis not present

## 2020-03-11 DIAGNOSIS — Z800 Family history of malignant neoplasm of digestive organs: Secondary | ICD-10-CM | POA: Insufficient documentation

## 2020-03-11 DIAGNOSIS — Z79899 Other long term (current) drug therapy: Secondary | ICD-10-CM | POA: Insufficient documentation

## 2020-03-11 DIAGNOSIS — M1990 Unspecified osteoarthritis, unspecified site: Secondary | ICD-10-CM | POA: Diagnosis not present

## 2020-03-11 DIAGNOSIS — Z86010 Personal history of colonic polyps: Secondary | ICD-10-CM | POA: Diagnosis not present

## 2020-03-11 DIAGNOSIS — K219 Gastro-esophageal reflux disease without esophagitis: Secondary | ICD-10-CM | POA: Insufficient documentation

## 2020-03-11 DIAGNOSIS — M2022 Hallux rigidus, left foot: Secondary | ICD-10-CM | POA: Diagnosis present

## 2020-03-11 DIAGNOSIS — F17200 Nicotine dependence, unspecified, uncomplicated: Secondary | ICD-10-CM | POA: Diagnosis not present

## 2020-03-11 DIAGNOSIS — F419 Anxiety disorder, unspecified: Secondary | ICD-10-CM | POA: Insufficient documentation

## 2020-03-11 HISTORY — PX: ARTHRODESIS METATARSALPHALANGEAL JOINT (MTPJ): SHX6566

## 2020-03-11 HISTORY — DX: Hallux rigidus, left foot: M20.22

## 2020-03-11 LAB — GLUCOSE, CAPILLARY
Glucose-Capillary: 207 mg/dL — ABNORMAL HIGH (ref 70–99)
Glucose-Capillary: 197 mg/dL — ABNORMAL HIGH (ref 70–99)

## 2020-03-11 SURGERY — FUSION, JOINT, GREAT TOE
Anesthesia: General | Site: Toe | Laterality: Left

## 2020-03-11 MED ORDER — LACTATED RINGERS IV SOLN: INTRAVENOUS | Status: DC

## 2020-03-11 MED ORDER — DEXMEDETOMIDINE HCL 200 MCG/2ML IV SOLN
INTRAVENOUS | Status: DC | PRN
  Administered 2020-03-11 (×3): 4 ug via INTRAVENOUS

## 2020-03-11 MED ORDER — PROMETHAZINE HCL 25 MG/ML IJ SOLN: 6.2500 mg | INTRAMUSCULAR | Status: DC | PRN

## 2020-03-11 MED ORDER — 0.9 % SODIUM CHLORIDE (POUR BTL) OPTIME
TOPICAL | Status: DC | PRN
  Administered 2020-03-11: 120 mL

## 2020-03-11 MED ORDER — MIDAZOLAM HCL 2 MG/2ML IJ SOLN
INTRAMUSCULAR | Status: AC
  Filled 2020-03-11: qty 2

## 2020-03-11 MED ORDER — LIDOCAINE 2% (20 MG/ML) 5 ML SYRINGE
INTRAMUSCULAR | Status: AC
  Filled 2020-03-11: qty 20

## 2020-03-11 MED ORDER — PROPOFOL 500 MG/50ML IV EMUL
INTRAVENOUS | Status: DC | PRN
  Administered 2020-03-11: 25 ug/kg/min via INTRAVENOUS

## 2020-03-11 MED ORDER — MIDAZOLAM HCL 2 MG/2ML IJ SOLN
2.0000 mg | Freq: Once | INTRAMUSCULAR | Status: AC
  Administered 2020-03-11: 2 mg via INTRAVENOUS

## 2020-03-11 MED ORDER — OXYCODONE HCL 5 MG/5ML PO SOLN: 5.0000 mg | Freq: Once | ORAL | Status: DC | PRN

## 2020-03-11 MED ORDER — SODIUM CHLORIDE 0.9 % IV SOLN: INTRAVENOUS | Status: DC

## 2020-03-11 MED ORDER — FENTANYL CITRATE (PF) 100 MCG/2ML IJ SOLN
100.0000 ug | Freq: Once | INTRAMUSCULAR | Status: AC
  Administered 2020-03-11: 100 ug via INTRAVENOUS

## 2020-03-11 MED ORDER — HYDROMORPHONE HCL 1 MG/ML IJ SOLN
INTRAMUSCULAR | Status: AC
  Filled 2020-03-11: qty 0.5

## 2020-03-11 MED ORDER — EPHEDRINE 5 MG/ML INJ
INTRAVENOUS | Status: AC
  Filled 2020-03-11: qty 10

## 2020-03-11 MED ORDER — VANCOMYCIN HCL 500 MG IV SOLR
INTRAVENOUS | Status: DC | PRN
  Administered 2020-03-11: 500 mg via TOPICAL

## 2020-03-11 MED ORDER — ROPIVACAINE HCL 5 MG/ML IJ SOLN
INTRAMUSCULAR | Status: DC | PRN
Start: 2020-03-11 — End: 2020-03-11
  Administered 2020-03-11: 30 mL via PERINEURAL

## 2020-03-11 MED ORDER — CEFAZOLIN SODIUM-DEXTROSE 2-4 GM/100ML-% IV SOLN
2.0000 g | INTRAVENOUS | Status: AC
  Administered 2020-03-11: 2 g via INTRAVENOUS

## 2020-03-11 MED ORDER — DEXMEDETOMIDINE HCL IN NACL 200 MCG/50ML IV SOLN
INTRAVENOUS | Status: AC
  Filled 2020-03-11: qty 50

## 2020-03-11 MED ORDER — PROPOFOL 500 MG/50ML IV EMUL
INTRAVENOUS | Status: AC
  Filled 2020-03-11: qty 100

## 2020-03-11 MED ORDER — MIDAZOLAM HCL 5 MG/5ML IJ SOLN
INTRAMUSCULAR | Status: DC | PRN
  Administered 2020-03-11: 2 mg via INTRAVENOUS

## 2020-03-11 MED ORDER — MEPERIDINE HCL 25 MG/ML IJ SOLN: 6.2500 mg | INTRAMUSCULAR | Status: DC | PRN

## 2020-03-11 MED ORDER — PROPOFOL 10 MG/ML IV BOLUS
INTRAVENOUS | Status: DC | PRN
  Administered 2020-03-11: 300 mg via INTRAVENOUS

## 2020-03-11 MED ORDER — EPHEDRINE 5 MG/ML INJ
INTRAVENOUS | Status: AC
  Filled 2020-03-11: qty 20

## 2020-03-11 MED ORDER — DOCUSATE SODIUM 100 MG PO CAPS
100.0000 mg | ORAL_CAPSULE | Freq: Every day | ORAL | 0 refills | Status: AC | PRN
Start: 2020-03-11 — End: 2021-03-11

## 2020-03-11 MED ORDER — OXYCODONE HCL 5 MG PO TABS: 5.0000 mg | ORAL_TABLET | Freq: Four times a day (QID) | ORAL | 0 refills | Status: AC | PRN

## 2020-03-11 MED ORDER — FENTANYL CITRATE (PF) 100 MCG/2ML IJ SOLN
INTRAMUSCULAR | Status: AC
  Filled 2020-03-11: qty 2

## 2020-03-11 MED ORDER — VANCOMYCIN HCL 500 MG IV SOLR
INTRAVENOUS | Status: AC
  Filled 2020-03-11: qty 500

## 2020-03-11 MED ORDER — ROCURONIUM BROMIDE 10 MG/ML (PF) SYRINGE
PREFILLED_SYRINGE | INTRAVENOUS | Status: AC
  Filled 2020-03-11: qty 10

## 2020-03-11 MED ORDER — SENNA 8.6 MG PO TABS
2.0000 | ORAL_TABLET | Freq: Two times a day (BID) | ORAL | 0 refills | Status: DC
Start: 2020-03-11 — End: 2020-04-28

## 2020-03-11 MED ORDER — PROPOFOL 10 MG/ML IV BOLUS
INTRAVENOUS | Status: AC
  Filled 2020-03-11: qty 20

## 2020-03-11 MED ORDER — OXYCODONE HCL 5 MG PO TABS: 5.0000 mg | ORAL_TABLET | Freq: Once | ORAL | Status: DC | PRN

## 2020-03-11 MED ORDER — AMISULPRIDE (ANTIEMETIC) 5 MG/2ML IV SOLN: 10.0000 mg | Freq: Once | INTRAVENOUS | Status: DC | PRN

## 2020-03-11 MED ORDER — DEXAMETHASONE SODIUM PHOSPHATE 10 MG/ML IJ SOLN
INTRAMUSCULAR | Status: AC
  Filled 2020-03-11: qty 2

## 2020-03-11 MED ORDER — ONDANSETRON HCL 4 MG/2ML IJ SOLN
INTRAMUSCULAR | Status: DC | PRN
  Administered 2020-03-11: 4 mg via INTRAVENOUS

## 2020-03-11 MED ORDER — GLYCOPYRROLATE PF 0.2 MG/ML IJ SOSY
PREFILLED_SYRINGE | INTRAMUSCULAR | Status: AC
  Filled 2020-03-11: qty 1

## 2020-03-11 MED ORDER — ONDANSETRON HCL 4 MG/2ML IJ SOLN
INTRAMUSCULAR | Status: AC
  Filled 2020-03-11: qty 18

## 2020-03-11 MED ORDER — CEFAZOLIN SODIUM-DEXTROSE 2-4 GM/100ML-% IV SOLN
INTRAVENOUS | Status: AC
  Filled 2020-03-11: qty 100

## 2020-03-11 MED ORDER — LIDOCAINE HCL (CARDIAC) PF 100 MG/5ML IV SOSY
PREFILLED_SYRINGE | INTRAVENOUS | Status: DC | PRN
  Administered 2020-03-11: 30 mg via INTRAVENOUS

## 2020-03-11 MED ORDER — FENTANYL CITRATE (PF) 100 MCG/2ML IJ SOLN
INTRAMUSCULAR | Status: DC | PRN
  Administered 2020-03-11 (×2): 50 ug via INTRAVENOUS

## 2020-03-11 MED ORDER — BUPIVACAINE HCL (PF) 0.5 % IJ SOLN
INTRAMUSCULAR | Status: AC
  Filled 2020-03-11: qty 30

## 2020-03-11 MED ORDER — PHENYLEPHRINE 40 MCG/ML (10ML) SYRINGE FOR IV PUSH (FOR BLOOD PRESSURE SUPPORT)
PREFILLED_SYRINGE | INTRAVENOUS | Status: AC
  Filled 2020-03-11: qty 20

## 2020-03-11 MED ORDER — HYDROMORPHONE HCL 1 MG/ML IJ SOLN
0.2500 mg | INTRAMUSCULAR | Status: DC | PRN
  Administered 2020-03-11 (×2): 0.5 mg via INTRAVENOUS

## 2020-03-11 MED ORDER — DEXAMETHASONE SODIUM PHOSPHATE 10 MG/ML IJ SOLN
INTRAMUSCULAR | Status: DC | PRN
  Administered 2020-03-11: 4 mg via INTRAVENOUS

## 2020-03-11 MED ORDER — ALPRAZOLAM 0.25 MG PO TABS
0.5000 mg | ORAL_TABLET | Freq: Once | ORAL | Status: AC | PRN
  Administered 2020-03-11: 0.5 mg via ORAL
  Filled 2020-03-11: qty 2

## 2020-03-11 SURGICAL SUPPLY — 89 items
BANDAGE ESMARK 6X9 LF (GAUZE/BANDAGES/DRESSINGS) IMPLANT
BIT DRILL 2.7XCANN QCK CNCT (BIT) ×1 IMPLANT
BIT DRILL CANN 2.7 (BIT) ×1
BIT DRILL CANN 2.7MM (BIT) ×1
BIT DRILL Q-C 2.0 DIA 100 (BIT) ×2 IMPLANT
BIT DRILL Q-C 2.0MM DIA 100MM (BIT) ×1
BIT DRL 2.7XCANN QCK CNCT (BIT) ×1
BLADE AVERAGE 25MMX9MM (BLADE)
BLADE AVERAGE 25X9 (BLADE) IMPLANT
BLADE MICRO SAGITTAL (BLADE) IMPLANT
BLADE OSC/SAG .038X5.5 CUT EDG (BLADE) IMPLANT
BLADE SURG 15 STRL LF DISP TIS (BLADE) ×3 IMPLANT
BLADE SURG 15 STRL SS (BLADE) ×6
BNDG COHESIVE 4X5 TAN STRL (GAUZE/BANDAGES/DRESSINGS) IMPLANT
BNDG COHESIVE 6X5 TAN STRL LF (GAUZE/BANDAGES/DRESSINGS) IMPLANT
BNDG CONFORM 3 STRL LF (GAUZE/BANDAGES/DRESSINGS) ×3 IMPLANT
BNDG ELASTIC 4X5.8 VLCR STR LF (GAUZE/BANDAGES/DRESSINGS) ×3 IMPLANT
BNDG ESMARK 6X9 LF (GAUZE/BANDAGES/DRESSINGS)
BOOT STEPPER DURA LG (SOFTGOODS) ×3 IMPLANT
BOOT STEPPER DURA MED (SOFTGOODS) IMPLANT
BOOT STEPPER DURA SM (SOFTGOODS) IMPLANT
BOOT STEPPER DURA XLG (SOFTGOODS) IMPLANT
CHLORAPREP W/TINT 26 (MISCELLANEOUS) ×3 IMPLANT
COUNTERSINK CANN Q/C (MISCELLANEOUS) ×3
COVER BACK TABLE 60X90IN (DRAPES) ×3 IMPLANT
COVER WAND RF STERILE (DRAPES) IMPLANT
CUFF TOURN SGL QUICK 34 (TOURNIQUET CUFF) ×2
CUFF TRNQT CYL 34X4.125X (TOURNIQUET CUFF) ×1 IMPLANT
DRAPE EXTREMITY T 121X128X90 (DISPOSABLE) ×3 IMPLANT
DRAPE OEC MINIVIEW 54X84 (DRAPES) ×3 IMPLANT
DRAPE U-SHAPE 47X51 STRL (DRAPES) ×3 IMPLANT
DRSG MEPITEL 4X7.2 (GAUZE/BANDAGES/DRESSINGS) ×3 IMPLANT
DRSG PAD ABDOMINAL 8X10 ST (GAUZE/BANDAGES/DRESSINGS) ×3 IMPLANT
ELECT REM PT RETURN 9FT ADLT (ELECTROSURGICAL) ×3
ELECTRODE REM PT RTRN 9FT ADLT (ELECTROSURGICAL) ×1 IMPLANT
GAUZE SPONGE 4X4 12PLY STRL (GAUZE/BANDAGES/DRESSINGS) ×3 IMPLANT
GLOVE BIO SURGEON STRL SZ8 (GLOVE) ×3 IMPLANT
GLOVE BIOGEL PI IND STRL 7.0 (GLOVE) ×2 IMPLANT
GLOVE BIOGEL PI IND STRL 8 (GLOVE) ×2 IMPLANT
GLOVE BIOGEL PI INDICATOR 7.0 (GLOVE) ×4
GLOVE BIOGEL PI INDICATOR 8 (GLOVE) ×4
GLOVE ECLIPSE 8.0 STRL XLNG CF (GLOVE) ×3 IMPLANT
GLOVE SURG SS PI 6.0 STRL IVOR (GLOVE) ×6 IMPLANT
GLOVE SURG SS PI 7.0 STRL IVOR (GLOVE) ×3 IMPLANT
GOWN STRL REUS W/ TWL LRG LVL3 (GOWN DISPOSABLE) ×2 IMPLANT
GOWN STRL REUS W/ TWL XL LVL3 (GOWN DISPOSABLE) ×2 IMPLANT
GOWN STRL REUS W/TWL LRG LVL3 (GOWN DISPOSABLE) ×4
GOWN STRL REUS W/TWL XL LVL3 (GOWN DISPOSABLE) ×4
GUIDEWIRE PIN ORTH 6X1.6XSMTH (WIRE) ×1 IMPLANT
K-WIRE 1.6 (WIRE) ×2
NEEDLE HYPO 22GX1.5 SAFETY (NEEDLE) IMPLANT
PACK BASIN DAY SURGERY FS (CUSTOM PROCEDURE TRAY) ×3 IMPLANT
PAD CAST 4YDX4 CTTN HI CHSV (CAST SUPPLIES) ×1 IMPLANT
PADDING CAST ABS 4INX4YD NS (CAST SUPPLIES)
PADDING CAST ABS COTTON 4X4 ST (CAST SUPPLIES) IMPLANT
PADDING CAST COTTON 4X4 STRL (CAST SUPPLIES) ×2
PADDING CAST COTTON 6X4 STRL (CAST SUPPLIES) IMPLANT
PENCIL SMOKE EVACUATOR (MISCELLANEOUS) ×3 IMPLANT
PLATE TUB 39 W/COLLAR 5H (Plate) ×3 IMPLANT
SANITIZER HAND PURELL 535ML FO (MISCELLANEOUS) ×3 IMPLANT
SCREW 2.7X16MM (Screw) ×2 IMPLANT
SCREW CANN THRD 4X42 108 (Screw) ×3 IMPLANT
SCREW CNTRSNK CANN Q/C (MISCELLANEOUS) ×1 IMPLANT
SCREW CORT 2.5X20X2.7XST SM (Screw) ×1 IMPLANT
SCREW CORTICAL 2.7X14MM (Screw) ×3 IMPLANT
SCREW CORTICAL 2.7X20MM (Screw) ×2 IMPLANT
SCREW CORTICAL LOCK 2.7X22 (Screw) ×3 IMPLANT
SCREW NLOCK CORT 2.7X16 NS (Screw) ×1 IMPLANT
SHEET MEDIUM DRAPE 40X70 STRL (DRAPES) ×3 IMPLANT
SLEEVE SCD COMPRESS KNEE MED (MISCELLANEOUS) ×3 IMPLANT
SPLINT FAST PLASTER 5X30 (CAST SUPPLIES)
SPLINT PLASTER CAST FAST 5X30 (CAST SUPPLIES) IMPLANT
SPONGE LAP 18X18 RF (DISPOSABLE) ×3 IMPLANT
SPONGE SURGIFOAM ABS GEL 12-7 (HEMOSTASIS) IMPLANT
STOCKINETTE 6  STRL (DRAPES) ×2
STOCKINETTE 6 STRL (DRAPES) ×1 IMPLANT
SUCTION FRAZIER HANDLE 10FR (MISCELLANEOUS) ×2
SUCTION TUBE FRAZIER 10FR DISP (MISCELLANEOUS) ×1 IMPLANT
SUT ETHILON 3 0 PS 1 (SUTURE) ×3 IMPLANT
SUT MNCRL AB 3-0 PS2 18 (SUTURE) ×3 IMPLANT
SUT VIC AB 0 SH 27 (SUTURE) IMPLANT
SUT VIC AB 2-0 SH 27 (SUTURE) ×2
SUT VIC AB 2-0 SH 27XBRD (SUTURE) ×1 IMPLANT
SYR BULB EAR ULCER 3OZ GRN STR (SYRINGE) ×3 IMPLANT
SYR CONTROL 10ML LL (SYRINGE) IMPLANT
TOWEL GREEN STERILE FF (TOWEL DISPOSABLE) ×6 IMPLANT
TUBE CONNECTING 20'X1/4 (TUBING) ×1
TUBE CONNECTING 20X1/4 (TUBING) ×2 IMPLANT
UNDERPAD 30X36 HEAVY ABSORB (UNDERPADS AND DIAPERS) ×3 IMPLANT

## 2020-03-11 NOTE — Anesthesia Postprocedure Evaluation (Signed)
Anesthesia Post Note  Patient: Antonio Lawson  Procedure(s) Performed: Left hallux metatarsophalangeal arthrodesis (Left Toe)     Patient location during evaluation: PACU Anesthesia Type: General Level of consciousness: awake and alert Pain management: pain level controlled Vital Signs Assessment: post-procedure vital signs reviewed and stable Respiratory status: spontaneous breathing, nonlabored ventilation and respiratory function stable Cardiovascular status: blood pressure returned to baseline and stable Postop Assessment: no apparent nausea or vomiting Anesthetic complications: no   No complications documented.  Last Vitals:  Vitals:   03/11/20 1100 03/11/20 1124  BP: (!) 148/99 (!) 147/104  Pulse: 84 82  Resp: 14 16  Temp:  36.5 C  SpO2: 95% 96%    Last Pain:  Vitals:   03/11/20 1124  TempSrc:   PainSc: 6     LLE Motor Response: No movement due to regional block (03/11/20 1124) LLE Sensation: No sensation (absent) (03/11/20 1124)          Lynda Rainwater

## 2020-03-11 NOTE — Anesthesia Preprocedure Evaluation (Signed)
Anesthesia Evaluation  Patient identified by MRN, date of birth, ID band Patient awake    Reviewed: Allergy & Precautions, NPO status , Patient's Chart, lab work & pertinent test results  Airway Mallampati: II  TM Distance: >3 FB Neck ROM: Full    Dental no notable dental hx.    Pulmonary neg pulmonary ROS, Current Smoker and Patient abstained from smoking.,    Pulmonary exam normal breath sounds clear to auscultation       Cardiovascular negative cardio ROS Normal cardiovascular exam Rhythm:Regular Rate:Normal     Neuro/Psych Anxiety Bipolar Disorder negative neurological ROS  negative psych ROS   GI/Hepatic Neg liver ROS, GERD  ,  Endo/Other  diabetes  Renal/GU negative Renal ROS  negative genitourinary   Musculoskeletal  (+) Arthritis , Osteoarthritis,    Abdominal   Peds negative pediatric ROS (+)  Hematology negative hematology ROS (+)   Anesthesia Other Findings   Reproductive/Obstetrics negative OB ROS                             Anesthesia Physical Anesthesia Plan  ASA: III  Anesthesia Plan: General   Post-op Pain Management:  Regional for Post-op pain   Induction: Intravenous  PONV Risk Score and Plan: 1 and Ondansetron and Treatment may vary due to age or medical condition  Airway Management Planned: LMA  Additional Equipment:   Intra-op Plan:   Post-operative Plan: Extubation in OR  Informed Consent: I have reviewed the patients History and Physical, chart, labs and discussed the procedure including the risks, benefits and alternatives for the proposed anesthesia with the patient or authorized representative who has indicated his/her understanding and acceptance.     Dental advisory given  Plan Discussed with: CRNA  Anesthesia Plan Comments:         Anesthesia Quick Evaluation

## 2020-03-11 NOTE — Anesthesia Procedure Notes (Signed)
Anesthesia Regional Block: Popliteal block   Pre-Anesthetic Checklist: ,, timeout performed, Correct Patient, Correct Site, Correct Laterality, Correct Procedure, Correct Position, site marked, Risks and benefits discussed,  Surgical consent,  Pre-op evaluation,  At surgeon's request and post-op pain management  Laterality: Left  Prep: chloraprep       Needles:  Injection technique: Single-shot  Needle Type: Stimiplex     Needle Length: 9cm  Needle Gauge: 21     Additional Needles:   Procedures:,,,, ultrasound used (permanent image in chart),,,,  Narrative:  Start time: 03/11/2020 8:22 AM End time: 03/11/2020 8:27 AM Injection made incrementally with aspirations every 5 mL.  Performed by: Personally  Anesthesiologist: Lynda Rainwater, MD

## 2020-03-11 NOTE — H&P (Signed)
Antonio Lawson is an 59 y.o. male.   Chief Complaint: Left foot pain HPI: The patient is a 59 year old male with a past medical history significant for bipolar disease.  He has a long history of bilateral forefoot pain worse on the left than on the right.  He has had previous cheilectomy on the left foot.  He continues to hurt with activity including standing and walking.  He feels better when he is off his foot completely.  He smokes cigarettes occasionally.  Past Medical History:  Diagnosis Date  . Bipolar 1 disorder ([place])   . Chicken pox   . Diverticulitis   . Diverticulosis   . Fatty liver   . GERD (gastroesophageal reflux disease)   . Hallux rigidus of left foot   . Hyperplastic colon polyp   . Internal hemorrhoids     Past Surgical History:  Procedure Laterality Date  . APPENDECTOMY    . HERNIA REPAIR      Family History  Problem Relation Age of Onset  . Pancreatic cancer Mother   . Heart attack Father   . Stroke Father   . Esophageal cancer Maternal Grandfather    Social History:  reports that he has been smoking cigarettes. He has been smoking about 0.25 packs per day. He has never used smokeless tobacco. He reports previous alcohol use. He reports that he does not use drugs.  Allergies:  Allergies  Allergen Reactions  . Morphine And Related     Medications Prior to Admission  Medication Sig Dispense Refill  . diclofenac (VOLTAREN) 75 MG EC tablet Take 75 mg by mouth 2 (two) times daily.    Marland Kitchen omeprazole (PRILOSEC) 10 MG capsule Take 10 mg by mouth daily.    . blood glucose meter kit and supplies Dispense based on patient and insurance preference. Use up to BID as directed. (FOR ICD-10 E10.9, E11.9). (Patient not taking: Reported on 02/16/2020) 1 each 0  . diclofenac sodium (VOLTAREN) 1 % GEL Apply 2 g topically 4 (four) times daily. (Patient not taking: Reported on 02/16/2020) 100 g 0  . Esomeprazole Magnesium (NEXIUM 24HR) 20 MG TBEC Take 20 mg by mouth daily as  needed (for reflux symptoms). (Patient not taking: Reported on 02/16/2020)    . glipiZIDE (GLUCOTROL XL) 10 MG 24 hr tablet TAKE 1 TABLET (10 MG TOTAL) BY MOUTH DAILY WITH BREAKFAST. (Patient not taking: Reported on 02/16/2020) 15 tablet 0  . metFORMIN (GLUCOPHAGE) 1000 MG tablet Take 1 tablet (1,000 mg total) by mouth 2 (two) times daily with a meal. (Patient not taking: Reported on 02/16/2020) 180 tablet 0  . methocarbamol (ROBAXIN) 500 MG tablet Take 1 tablet (500 mg total) by mouth 2 (two) times daily. 20 tablet 0  . naproxen (NAPROSYN) 500 MG tablet Take 1 tablet (500 mg total) by mouth 2 (two) times daily. (Patient not taking: Reported on 02/16/2020) 10 tablet 0  . oxyCODONE-acetaminophen (PERCOCET) 10-325 MG tablet Take 1 tablet by mouth every 8 (eight) hours as needed for pain. (Patient not taking: Reported on 02/16/2020) 30 tablet 0  . QUEtiapine (SEROQUEL) 100 MG tablet Take 50 mg by mouth at bedtime.  (Patient not taking: Reported on 02/16/2020)      Results for orders placed or performed during the hospital encounter of 03/11/20 (from the past 48 hour(s))  Basic metabolic panel per protocol     Status: Abnormal   Collection Time: 03/09/20 12:39 PM  Result Value Ref Range   Sodium 137 135 -  145 mmol/L   Potassium 4.2 3.5 - 5.1 mmol/L   Chloride 106 98 - 111 mmol/L   CO2 21 (L) 22 - 32 mmol/L   Glucose, Bld 253 (H) 70 - 99 mg/dL    Comment: Glucose reference range applies only to samples taken after fasting for at least 8 hours.   BUN 14 6 - 20 mg/dL   Creatinine, Ser 0.88 0.61 - 1.24 mg/dL   Calcium 9.3 8.9 - 10.3 mg/dL   GFR calc non Af Amer >60 >60 mL/min   GFR calc Af Amer >60 >60 mL/min   Anion gap 10 5 - 15    Comment: Performed at [facility] [address] [place], [place] [address]  Glucose, capillary     Status: Abnormal   Collection Time: 2020/03/15  7:55 AM  Result Value Ref Range   Glucose-Capillary 207 (H) 70 - 99 mg/dL    Comment: Glucose reference range  applies only to samples taken after fasting for at least 8 hours.   No results found.  Review of Systems no recent fever, chills, nausea, vomiting or changes in his appetite  Blood pressure 127/90, pulse 79, temperature 98.7 F (37.1 C), temperature source Oral, height '6\' 1"'  (1.854 m), weight 99.2 kg, SpO2 96 %. Physical Exam  Well-nourished well-developed man in no apparent distress.  Alert and oriented x4.  Normal mood and affect.  Gait is normal.  The left hallux has decreased range of motion at the MP joint.  Dorsal scar is healed.  No signs of infection.  Pulses are palpable in the foot.  Intact sensibility to light touch dorsally and plantarly at the forefoot.  Assessment/Plan Left hallux rigidus -to the operating room today for hallux MP joint arthrodesis.  The risks and benefits of the alternative treatment options have been discussed in detail.  The patient wishes to proceed with surgery and specifically understands risks of bleeding, infection, nerve damage, blood clots, need for additional surgery, amputation and death.   Wylene Simmer, MD 2020-03-15, 8:15 AM

## 2020-03-11 NOTE — Op Note (Signed)
03/11/2020  10:06 AM  PATIENT:  Antonio Lawson  59 y.o. male  PRE-OPERATIVE DIAGNOSIS:  Left foot hallux rigidus  POST-OPERATIVE DIAGNOSIS:  Left foot hallux rigidus  Procedure(s): 1.  Left hallux metatarsophalangeal arthrodesis   2.  AP and lateral radiographs of the left foot  SURGEON:  Wylene Simmer, MD  ASSISTANT: Mechele Claude, PA-C  ANESTHESIA:   General, regional  EBL:  minimal   TOURNIQUET:   Total Tourniquet Time Documented: Thigh (Left) - 45 minutes Total: Thigh (Left) - 45 minutes  COMPLICATIONS:  None apparent  DISPOSITION:  Extubated, awake and stable to recovery.  INDICATION FOR PROCEDURE: The patient is a 59 year old male with a past medical history significant for bipolar disorder and smoking.  He has a long history of left forefoot pain due to hallux rigidus.  He has failed nonoperative and operative treatment to date.  He presents now for left hallux MP joint arthrodesis.  The risks and benefits of the alternative treatment options have been discussed in detail.  The patient wishes to proceed with surgery and specifically understands risks of bleeding, infection, nerve damage, blood clots, need for additional surgery, amputation and death.  PROCEDURE IN DETAIL:  After pre operative consent was obtained, and the correct operative site was identified, the patient was brought to the operating room and placed supine on the OR table.  Anesthesia was administered.  Pre-operative antibiotics were administered.  A surgical timeout was taken.  The left lower extremity was prepped and draped in standard sterile fashion with a tourniquet around the thigh.  The extremity was elevated and the tourniquet inflated to 250 mmHg.  The previous dorsal incision over the hallux MP joint was opened again sharply.  Dissection was carried down through the subcutaneous tissues.  The dorsal joint capsule was incised and elevated medially and laterally.  The collateral ligaments were released  and the head of the metatarsal was exposed.  There was end-stage arthrosis of both sides of the joint with minimal remaining articular cartilage.  There were large peripheral osteophytes.  A K wire was inserted in the center of the metatarsal head.  A concave reamer was used to remove the remaining articular cartilage and subchondral bone.  Peripheral osteophytes were removed with a rondure.  K wire was then placed in the base of the proximal phalanx.  A convex reamer was used to remove the remaining articular cartilage and subchondral bone.  The wound was then irrigated copiously.  A 2 mm drill bit was used to perforate both sides of the joint leaving the resultant bone graft in place.  The joint was reduced and provisionally pinned.  Radiographs confirmed appropriate alignment of the joint.  Simulated weightbearing exam revealed appropriate dorsiflexion of the hallux.  The joint was then fixed with a [employer] 4 mm partially-threaded stainless steel screw.  This compressed the joint appropriately and had excellent purchase.  A 5 hole one quarter tubular plate from the [employer] stainless steel mini frag set was selected.  It was applied to the dorsum of the joint and fixed proximally and distally with 2 bicortical screws on either side of the joint.  AP and lateral radiographs confirmed appropriate position and length of all hardware and appropriate alignment of the MP joint.  The wounds were irrigated copiously and sprinkled with vancomycin powder.  Subcutaneous tissues were approximated with Vicryl.  The skin incision was closed with nylon.  Sterile dressings were applied followed by a compression wrap and a cam  boot.  The tourniquet was released after application of the dressings.  The patient was awakened from anesthesia and transported to the recovery room in stable condition.  FOLLOW UP PLAN: Weightbearing as tolerated on the heel in a cam boot.  Follow-up in the office in 2 weeks for suture  removal.  Plan 6 weeks of weightbearing immobilization in the cam boot postoperatively.   RADIOGRAPHS: AP and lateral radiographs of the left foot are obtained intraoperatively.  These show interval arthrodesis of the hallux MP joint.  Hardware is appropriately positioned and of the appropriate lengths.  No other acute injuries are noted.    Mechele Claude PA-C was present and scrubbed for the duration of the operative case. His assistance was essential in positioning the patient, prepping and draping, gaining and maintaining exposure, performing the operation, closing and dressing the wounds and applying the splint.

## 2020-03-11 NOTE — Progress Notes (Signed)
Pt woke up moving all overbed attempting to get out of bed saying he has to go to bathroom. Complaining of pain. Not following instructions. Pt assisted on side of bed with another pt to void . Voided small amt. Assisted back to bed. Dr Doran Durand at bedside

## 2020-03-11 NOTE — Anesthesia Procedure Notes (Signed)
Procedure Name: LMA Insertion Date/Time: 03/11/2020 8:37 AM Performed by: Signe Colt, CRNA Pre-anesthesia Checklist: Patient identified, Emergency Drugs available, Suction available and Patient being monitored Patient Re-evaluated:Patient Re-evaluated prior to induction Oxygen Delivery Method: Circle system utilized Preoxygenation: Pre-oxygenation with 100% oxygen Induction Type: IV induction Ventilation: Mask ventilation without difficulty LMA: LMA inserted LMA Size: 4.0 Number of attempts: 1 Airway Equipment and Method: Bite block Placement Confirmation: positive ETCO2 Tube secured with: Tape Dental Injury: Teeth and Oropharynx as per pre-operative assessment

## 2020-03-11 NOTE — Discharge Instructions (Addendum)
Wylene Simmer, MD [facility]  Please read the following information regarding your care after surgery.  Medications  You only need a prescription for the narcotic pain medicine (ex. oxycodone, Percocet, Norco).  All of the other medicines listed below are available over the counter. X Aleve 2 pills twice a day for the first 3 days after surgery. X acetominophen (Tylenol) 650 mg every 4-6 hours as you need for minor to moderate pain X oxycodone as prescribed for severe pain  Narcotic pain medicine (ex. oxycodone, Percocet, Vicodin) will cause constipation.  To prevent this problem, take the following medicines while you are taking any pain medicine. X docusate sodium (Colace) 100 mg twice a day X senna (Senokot) 2 tablets twice a day  Weight Bearing X Bear weight only on your operated foot in the CAM boot.   Cast / Splint / Dressing X Keep your splint, cast or dressing clean and dry.  Don't put anything (coat hanger, pencil, etc) down inside of it.  If it gets damp, use a hair dryer on the cool setting to dry it.  If it gets soaked, call the office to schedule an appointment for a cast change. X Do not remove the dressing.  After your dressing, cast or splint is removed; you may shower, but do not soak or scrub the wound.  Allow the water to run over it, and then gently pat it dry.  Swelling It is normal for you to have swelling where you had surgery.  To reduce swelling and pain, keep your toes above your nose for at least 3 days after surgery.  It may be necessary to keep your foot or leg elevated for several weeks.  If it hurts, it should be elevated.  Follow Up Call my office at [phone] when you are discharged from the hospital or surgery center to schedule an appointment to be seen two weeks after surgery.  Call my office at [phone] if you develop a fever >101.5 F, nausea, vomiting, bleeding from the surgical site or severe pain.     Post Anesthesia Home Care  Instructions  Activity: Get plenty of rest for the remainder of the day. A responsible individual must stay with you for 24 hours following the procedure.  For the next 24 hours, DO NOT: -Drive a car -Paediatric nurse -Drink alcoholic beverages -Take any medication unless instructed by your physician -Make any legal decisions or sign important papers.  Meals: Start with liquid foods such as gelatin or soup. Progress to regular foods as tolerated. Avoid greasy, spicy, heavy foods. If nausea and/or vomiting occur, drink only clear liquids until the nausea and/or vomiting subsides. Call your physician if vomiting continues.  Special Instructions/Symptoms: Your throat may feel dry or sore from the anesthesia or the breathing tube placed in your throat during surgery. If this causes discomfort, gargle with warm salt water. The discomfort should disappear within 24 hours.  If you had a scopolamine patch placed behind your ear for the management of post- operative nausea and/or vomiting:  1. The medication in the patch is effective for 72 hours, after which it should be removed.  Wrap patch in a tissue and discard in the trash. Wash hands thoroughly with soap and water. 2. You may remove the patch earlier than 72 hours if you experience unpleasant side effects which may include dry mouth, dizziness or visual disturbances. 3. Avoid touching the patch. Wash your hands with soap and water after contact with the patch.  Regional Anesthesia Blocks  1. Numbness or the inability to move the "blocked" extremity may last from 3-48 hours after placement. The length of time depends on the medication injected and your individual response to the medication. If the numbness is not going away after 48 hours, call your surgeon.  2. The extremity that is blocked will need to be protected until the numbness is gone and the  Strength has returned. Because you cannot feel it, you will need to take extra care to  avoid injury. Because it may be weak, you may have difficulty moving it or using it. You may not know what position it is in without looking at it while the block is in effect.  3. For blocks in the legs and feet, returning to weight bearing and walking needs to be done carefully. You will need to wait until the numbness is entirely gone and the strength has returned. You should be able to move your leg and foot normally before you try and bear weight or walk. You will need someone to be with you when you first try to ensure you do not fall and possibly risk injury.  4. Bruising and tenderness at the needle site are common side effects and will resolve in a few days.  5. Persistent numbness or new problems with movement should be communicated to the surgeon or the [facility] [phone] [place] [phone]).

## 2020-03-11 NOTE — Transfer of Care (Signed)
Immediate Anesthesia Transfer of Care Note  Patient: Antonio Lawson  Procedure(s) Performed: Left hallux metatarsophalangeal arthrodesis (Left Toe)  Patient Location: PACU  Anesthesia Type:GA combined with regional for post-op pain  Level of Consciousness: drowsy and patient cooperative  Airway & Oxygen Therapy: Patient Spontanous Breathing and Patient connected to face mask oxygen  Post-op Assessment: Report given to RN and Post -op Vital signs reviewed and stable  Post vital signs: Reviewed and stable  Last Vitals:  Vitals Value Taken Time  BP 119/71 03/11/20 0943  Temp    Pulse 88 03/11/20 0944  Resp 17 03/11/20 0944  SpO2 95 % 03/11/20 0944  Vitals shown include unvalidated device data.  Last Pain:  Vitals:   03/11/20 0741  TempSrc: Oral  PainSc: 10-Worst pain ever      Patients Stated Pain Goal: 3 (00/71/21 9758)  Complications: No complications documented.

## 2020-03-11 NOTE — Progress Notes (Signed)
Assisted Dr. Miller with left, ultrasound guided, popliteal block. Side rails up, monitors on throughout procedure. See vital signs in flow sheet. Tolerated Procedure well. 

## 2020-03-12 ENCOUNTER — Encounter (HOSPITAL_BASED_OUTPATIENT_CLINIC_OR_DEPARTMENT_OTHER): Payer: Self-pay | Admitting: Orthopedic Surgery

## 2020-04-27 ENCOUNTER — Other Ambulatory Visit: Payer: Self-pay

## 2020-04-27 ENCOUNTER — Emergency Department (HOSPITAL_COMMUNITY)
Admission: EM | Admit: 2020-04-27 | Discharge: 2020-04-28 | Disposition: A | Discharge status: Home / Self Care | Payer: Medicare HMO | Attending: Emergency Medicine | Admitting: Emergency Medicine

## 2020-04-27 DIAGNOSIS — E860 Dehydration: Secondary | ICD-10-CM

## 2020-04-27 DIAGNOSIS — R739 Hyperglycemia, unspecified: Secondary | ICD-10-CM | POA: Diagnosis present

## 2020-04-27 DIAGNOSIS — F17210 Nicotine dependence, cigarettes, uncomplicated: Secondary | ICD-10-CM | POA: Diagnosis not present

## 2020-04-27 DIAGNOSIS — E1165 Type 2 diabetes mellitus with hyperglycemia: Secondary | ICD-10-CM | POA: Diagnosis not present

## 2020-04-27 DIAGNOSIS — E1110 Type 2 diabetes mellitus with ketoacidosis without coma: Secondary | ICD-10-CM | POA: Insufficient documentation

## 2020-04-27 LAB — CBC
HCT: 41.3 % (ref 39.0–52.0)
Hemoglobin: 14.2 g/dL (ref 13.0–17.0)
MCH: 32.1 pg (ref 26.0–34.0)
MCHC: 34.4 g/dL (ref 30.0–36.0)
MCV: 93.2 fL (ref 80.0–100.0)
Platelets: 237 10*3/uL (ref 150–400)
RBC: 4.43 MIL/uL (ref 4.22–5.81)
RDW: 12.2 % (ref 11.5–15.5)
WBC: 8.4 10*3/uL (ref 4.0–10.5)
nRBC: 0 % (ref 0.0–0.2)

## 2020-04-27 LAB — URINALYSIS, ROUTINE W REFLEX MICROSCOPIC
Bacteria, UA: NONE SEEN
Bilirubin Urine: NEGATIVE
Glucose, UA: >=500 mg/dL — AB
Hgb urine dipstick: NEGATIVE
Ketones, ur: 20 mg/dL — AB
Leukocytes,Ua: NEGATIVE
Nitrite: NEGATIVE
Protein, ur: NEGATIVE mg/dL
Specific Gravity, Urine: 1.03 (ref 1.005–1.030)
pH: 5 (ref 5.0–8.0)

## 2020-04-27 LAB — BASIC METABOLIC PANEL
Anion gap: 11 (ref 5–15)
BUN: 10 mg/dL (ref 6–20)
CO2: 18 mmol/L — ABNORMAL LOW (ref 22–32)
Calcium: 8.8 mg/dL — ABNORMAL LOW (ref 8.9–10.3)
Chloride: 101 mmol/L (ref 98–111)
Creatinine, Ser: 0.93 mg/dL (ref 0.61–1.24)
GFR calc Af Amer: >60 mL/min (ref >60)
GFR calc non Af Amer: >60 mL/min (ref >60)
Glucose, Bld: 616 mg/dL (ref 70–99)
Potassium: 4 mmol/L (ref 3.5–5.1)
Sodium: 130 mmol/L — ABNORMAL LOW (ref 135–145)

## 2020-04-27 LAB — CBG MONITORING, ED: Glucose-Capillary: 542 mg/dL (ref 70–99)

## 2020-04-27 NOTE — ED Provider Notes (Signed)
Emergency Medicine Provider Triage Evaluation Note  Antonio Lawson , a 59 y.o. male  was evaluated in triage.  Pt complains of hyperglycemia. Not taking metformin as he was prescribed because it makes him feel bad. PCP on vacation. Having polyuria/polydipsia. Hx T2DM  Review of Systems  Positive: Polyuria, polydipsia.  Negative: Nausea, vomiting, abdominal pain, syncope.   Physical Exam  BP 123/86   Pulse 93   Temp 99 F (37.2 C) (Oral)   Resp 18   SpO2 98%  Gen:   Awake, no distress   HEENT:  Atraumatic  Resp:  Normal effort  Cardiac:  Normal rate  Abd:   Nondistended, nontender  MSK:   Moves extremities without difficulty  Neuro:  Speech clear   Medical Decision Making  Medically screening exam initiated at 4:05 PM.  Appropriate orders placed.  Corley Maffeo Geist was informed that the remainder of the evaluation will be completed by another provider, this initial triage assessment does not replace that evaluation, and the importance of remaining in the ED until their evaluation is complete.  Clinical Impression  Hyperglycemia     Amaryllis Dyke, PA-C 04/27/20 1614    Truddie Hidden, MD 04/27/20 1755

## 2020-04-27 NOTE — ED Triage Notes (Signed)
Pt reports CBG of 550 this morning. Takes Metformin at home. Endorses polydipsia and polyuria.

## 2020-04-28 DIAGNOSIS — E1165 Type 2 diabetes mellitus with hyperglycemia: Secondary | ICD-10-CM | POA: Diagnosis not present

## 2020-04-28 LAB — BASIC METABOLIC PANEL
Anion gap: 9 (ref 5–15)
BUN: 11 mg/dL (ref 6–20)
CO2: 23 mmol/L (ref 22–32)
Calcium: 9.4 mg/dL (ref 8.9–10.3)
Chloride: 95 mmol/L — ABNORMAL LOW (ref 98–111)
Creatinine, Ser: 1 mg/dL (ref 0.61–1.24)
GFR calc Af Amer: >60 mL/min (ref >60)
GFR calc non Af Amer: >60 mL/min (ref >60)
Glucose, Bld: 715 mg/dL (ref 70–99)
Potassium: 4 mmol/L (ref 3.5–5.1)
Sodium: 127 mmol/L — ABNORMAL LOW (ref 135–145)

## 2020-04-28 LAB — CBG MONITORING, ED
Glucose-Capillary: >600 mg/dL (ref 70–99)
Glucose-Capillary: 408 mg/dL — ABNORMAL HIGH (ref 70–99)
Glucose-Capillary: 189 mg/dL — ABNORMAL HIGH (ref 70–99)
Glucose-Capillary: 110 mg/dL — ABNORMAL HIGH (ref 70–99)
Glucose-Capillary: 119 mg/dL — ABNORMAL HIGH (ref 70–99)

## 2020-04-28 MED ORDER — LACTATED RINGERS IV BOLUS
2000.0000 mL | Freq: Once | INTRAVENOUS | Status: AC
  Administered 2020-04-28: 2000 mL via INTRAVENOUS

## 2020-04-28 MED ORDER — LACTATED RINGERS IV SOLN: INTRAVENOUS | Status: DC

## 2020-04-28 MED ORDER — INSULIN REGULAR(HUMAN) IN NACL 100-0.9 UT/100ML-% IV SOLN
INTRAVENOUS | Status: DC
  Administered 2020-04-28: 9.5 [IU]/h via INTRAVENOUS
  Administered 2020-04-28: 19 [IU]/h via INTRAVENOUS
  Filled 2020-04-28: qty 100

## 2020-04-28 MED ORDER — DEXTROSE IN LACTATED RINGERS 5 % IV SOLN: INTRAVENOUS | Status: DC

## 2020-04-28 MED ORDER — DEXTROSE 50 % IV SOLN: 0.0000 mL | INTRAVENOUS | Status: DC | PRN

## 2020-04-28 NOTE — ED Provider Notes (Signed)
[facility] EMERGENCY DEPARTMENT Provider Note   CSN: [id number] Arrival date & time: 04/27/20  1410     History Chief Complaint  Patient presents with  . Hyperglycemia    Antonio Lawson is a 59 y.o. male.  The history is provided by the patient.  Hyperglycemia Severity:  Severe Onset quality:  Gradual Timing:  Constant Progression:  Worsening Chronicity:  New Context: noncompliance   Relieved by:  Nothing Associated symptoms: fatigue, increased appetite, increased thirst and polyuria   Associated symptoms: no fever    Patient with history of diabetes presents with hyperglycemia.  Patient reports he has been noncompliant with his Metformin as he felt that he related have diabetes.  Recently he has been very thirsty and eating quite a bit.  He also reports polyuria.  He has tried to see his PCP, but he is reported to be on vacation.    Past Medical History:  Diagnosis Date  . Bipolar 1 disorder ([place])   . Chicken pox   . Diverticulitis   . Diverticulosis   . Fatty liver   . GERD (gastroesophageal reflux disease)   . Hallux rigidus of left foot   . Hyperplastic colon polyp   . Internal hemorrhoids     Patient Active Problem List   Diagnosis Date Noted  . DM (diabetes mellitus) ([place]) 08/30/2017  . Hypocalcemia 08/26/2017  . DKA, type 2 ([place]) 08/25/2017  . Left foot pain 12/18/2016  . Hallux limitus of left foot 11/10/2016  . Osteoarthritis of spine with radiculopathy, lumbosacral region 11/09/2016  . Perianal irritation 06/07/2016  . Incontinence of feces 06/07/2016  . Arthralgia 06/01/2016  . Anxiety state 06/01/2016  . Hemorrhoids 03/28/2016  . GERD (gastroesophageal reflux disease) 03/28/2016  . Anal discharge 03/28/2016  . Sleep disturbance 03/28/2016  . Bipolar I disorder ([place]) 03/28/2016  . Overweight (BMI 25.0-29.9) 03/28/2016  . Tobacco use disorder 03/28/2016    Past Surgical History:  Procedure Laterality Date  .  APPENDECTOMY    . ARTHRODESIS METATARSALPHALANGEAL JOINT (MTPJ) Left 03/11/2020   Procedure: Left hallux metatarsophalangeal arthrodesis;  Surgeon: Wylene Simmer, MD;  Location: [facility];  Service: Orthopedics;  Laterality: Left;  . HERNIA REPAIR         Family History  Problem Relation Age of Onset  . Pancreatic cancer Mother   . Heart attack Father   . Stroke Father   . Esophageal cancer Maternal Grandfather     Social History   Tobacco Use  . Smoking status: Current Every Day Smoker    Packs/day: 0.25    Types: Cigarettes    Last attempt to quit: 08/18/2017    Years since quitting: 2.6  . Smokeless tobacco: Never Used  Vaping Use  . Vaping Use: Never used  Substance Use Topics  . Alcohol use: Not Currently  . Drug use: No    Home Medications Prior to Admission medications   Medication Sig Start Date End Date Taking? Authorizing Provider  blood glucose meter kit and supplies Dispense based on patient and insurance preference. Use up to BID as directed. (FOR ICD-10 E10.9, E11.9). Patient not taking: Reported on 02/16/2020 09/07/17   Hoyt Koch, MD  diclofenac (VOLTAREN) 75 MG EC tablet Take 75 mg by mouth 2 (two) times daily.    [provider]  diclofenac sodium (VOLTAREN) 1 % GEL Apply 2 g topically 4 (four) times daily. Patient not taking: Reported on 02/16/2020 09/06/17   Hoyt Koch, MD  docusate sodium (COLACE) 100 MG capsule Take 1 capsule (100 mg total) by mouth daily as needed. 03/11/20 03/11/21  Corky Sing, PA-C  Esomeprazole Magnesium (NEXIUM 24HR) 20 MG TBEC Take 20 mg by mouth daily as needed (for reflux symptoms). Patient not taking: Reported on 02/16/2020    [provider]  glipiZIDE (GLUCOTROL XL) 10 MG 24 hr tablet TAKE 1 TABLET (10 MG TOTAL) BY MOUTH DAILY WITH BREAKFAST. Patient not taking: Reported on 02/16/2020 10/16/17   Hoyt Koch, MD  metFORMIN (GLUCOPHAGE) 1000 MG tablet Take 1 tablet  (1,000 mg total) by mouth 2 (two) times daily with a meal. Patient not taking: Reported on 02/16/2020 09/06/17   Hoyt Koch, MD  methocarbamol (ROBAXIN) 500 MG tablet Take 1 tablet (500 mg total) by mouth 2 (two) times daily. 02/16/20   Volanda Napoleon, PA-C  naproxen (NAPROSYN) 500 MG tablet Take 1 tablet (500 mg total) by mouth 2 (two) times daily. Patient not taking: Reported on 02/16/2020 10/13/19   Petrucelli, Glynda Jaeger, PA-C  omeprazole (PRILOSEC) 10 MG capsule Take 10 mg by mouth daily.    [provider]  QUEtiapine (SEROQUEL) 100 MG tablet Take 50 mg by mouth at bedtime.  Patient not taking: Reported on 02/16/2020    [provider]  senna (SENOKOT) 8.6 MG TABS tablet Take 2 tablets (17.2 mg total) by mouth 2 (two) times daily. 03/11/20   Corky Sing, PA-C    Allergies    Morphine and related  Review of Systems   Review of Systems  Constitutional: Positive for fatigue. Negative for fever.  Endocrine: Positive for polydipsia and polyuria.  Psychiatric/Behavioral: The patient is nervous/anxious.   All other systems reviewed and are negative.   Physical Exam Updated Vital Signs BP (!) 138/94 (BP Location: Right Arm)   Pulse 77   Temp 98.5 F (36.9 C) (Oral)   Resp 16   SpO2 100%   Physical Exam CONSTITUTIONAL: Disheveled, no acute distress HEAD: Normocephalic/atraumatic EYES: EOMI/PERRL ENMT: Mask in place NECK: supple no meningeal signs SPINE/BACK:entire spine nontender CV: S1/S2 noted, no murmurs/rubs/gallops noted LUNGS: Lungs are clear to auscultation bilaterally, no apparent distress ABDOMEN: soft, nontender, no rebound or guarding, bowel sounds noted throughout abdomen GU:no cva tenderness NEURO: Pt is awake/alert/appropriate, moves all extremitiesx4.  No facial droop.   EXTREMITIES: pulses normal/equal, full ROM, well-healed surgical scar to left great toe, no drainage or secondary signs of infection SKIN: warm, color  normal PSYCH: Anxious  ED Results / Procedures / Treatments   Labs (all labs ordered are listed, but only abnormal results are displayed) Labs Reviewed  BASIC METABOLIC PANEL - Abnormal; Notable for the following components:      Result Value   Sodium 130 (*)    CO2 18 (*)    Glucose, Bld 616 (*)    Calcium 8.8 (*)    All other components within normal limits  URINALYSIS, ROUTINE W REFLEX MICROSCOPIC - Abnormal; Notable for the following components:   Color, Urine STRAW (*)    Glucose, UA >=500 (*)    Ketones, ur 20 (*)    All other components within normal limits  BASIC METABOLIC PANEL - Abnormal; Notable for the following components:   Sodium 127 (*)    Chloride 95 (*)    Glucose, Bld 715 (*)    All other components within normal limits  CBG MONITORING, ED - Abnormal; Notable for the following components:   Glucose-Capillary 542 (*)  All other components within normal limits  CBG MONITORING, ED - Abnormal; Notable for the following components:   Glucose-Capillary >600 (*)    All other components within normal limits  CBG MONITORING, ED - Abnormal; Notable for the following components:   Glucose-Capillary 408 (*)    All other components within normal limits  CBG MONITORING, ED - Abnormal; Notable for the following components:   Glucose-Capillary 189 (*)    All other components within normal limits  CBG MONITORING, ED - Abnormal; Notable for the following components:   Glucose-Capillary 110 (*)    All other components within normal limits  CBG MONITORING, ED - Abnormal; Notable for the following components:   Glucose-Capillary 119 (*)    All other components within normal limits  CBC    EKG None  Radiology No results found.  Procedures .Critical Care Performed by: Ripley Fraise, MD Authorized by: Ripley Fraise, MD   Critical care provider statement:    Critical care time (minutes):  60   Critical care start time:  04/28/2020 4:30 AM   Critical care  end time:  04/28/2020 5:30 AM   Critical care was necessary to treat or prevent imminent or life-threatening deterioration of the following conditions:  Endocrine crisis and dehydration   Critical care was time spent personally by me on the following activities:  Ordering and review of laboratory studies, re-evaluation of patient's condition, review of old charts, examination of patient, evaluation of patient's response to treatment, obtaining history from patient or surrogate and ordering and performing treatments and interventions   I assumed direction of critical care for this patient from another provider in my specialty: no      Medications Ordered in ED Medications  lactated ringers infusion ( Intravenous Stopped 04/28/20 0421)  dextrose 5 % in lactated ringers infusion ( Intravenous Stopped 04/28/20 0459)  dextrose 50 % solution 0-50 mL (has no administration in time range)  lactated ringers bolus 2,000 mL (0 mLs Intravenous Stopped 04/28/20 0412)    ED Course  I have reviewed the triage vital signs and the nursing notes.  Pertinent labs results that were available during my care of the patient were reviewed by me and considered in my medical decision making (see chart for details).    MDM Rules/Calculators/A&P                          2:19 AM Presents for hyperglycemia due to noncompliance.  While patient has been waiting he has had multiple meals at [place] and from the vending machine. He is currently in no acute distress.  He has significant hyperglycemia but initial labs not reveal DKA.  We will recheck him basic metabolic panel while he is awaiting.  Glucose stabilizer has been initiated 4:39 AM Glucose is dramatically improved.  Patient feels improved and is in no acute distress.  We will continue to monitor 6:10 AM Glucoses continue to improve.  However patient is now yelling and screaming at staff.  He reports he has leg cramps and is demanding discharge.  Repeat glucose is  119.  Patient will be discharged with PCP follow-up.  Patient is awake alert and in otherwise no acute distress Final Clinical Impression(s) / ED Diagnoses Final diagnoses:  Hyperglycemia  Dehydration    Rx / DC Orders ED Discharge Orders    None       Ripley Fraise, MD 04/28/20 [phone]

## 2020-07-19 ENCOUNTER — Ambulatory Visit (INDEPENDENT_AMBULATORY_CARE_PROVIDER_SITE_OTHER): Payer: Medicare Other | Admitting: Orthopedic Surgery

## 2020-07-19 ENCOUNTER — Encounter: Payer: Self-pay | Admitting: Orthopedic Surgery

## 2020-07-19 ENCOUNTER — Ambulatory Visit (INDEPENDENT_AMBULATORY_CARE_PROVIDER_SITE_OTHER): Payer: Medicare Other

## 2020-07-19 VITALS — Ht 73.0 in | Wt 218.0 lb

## 2020-07-19 DIAGNOSIS — M6702 Short Achilles tendon (acquired), left ankle: Secondary | ICD-10-CM | POA: Diagnosis not present

## 2020-07-19 DIAGNOSIS — M79672 Pain in left foot: Secondary | ICD-10-CM

## 2020-07-19 NOTE — Progress Notes (Signed)
Office Visit Note   Patient: Antonio Lawson           Date of Birth: 06-15-1961           MRN: [id number] Visit Date: 07/19/2020              Requested by: [facility] [address] [place],  [place] [address] PCP: [place]  Chief Complaint  Patient presents with  . Left Foot - Pain      HPI: Patient is a 59 year old gentleman who underwent left great toe MTP fusion with Antonio Lawson for hallux rigidus.  Patient states he has constant pain in his foot states that he has fractured his second toe.  He is currently full weightbearing in regular shoes patient is disappointed with his results.  Assessment & Plan: Visit Diagnoses:  1. Pain in left foot     Plan: Recommended a stiff soled shoe such as [place] to unload pressure across the forefoot.  Recommend Achilles stretching and this was demonstrated.  Discussed the importance of proper glucose control.  Some of patient's symptoms may be neuropathic from her diabetes.  Follow-Up Instructions: No follow-ups on file.   Ortho Exam  Patient is alert, oriented, no adenopathy, well-dressed, normal affect, normal respiratory effort. Examination radiographs do show a normal cascade of the metatarsal heads.  He has a healed fracture through the base of the proximal phalanx second toe.  Patient has subjective numbness in all of the toes.  Patient has Achilles contracture with dorsiflexion only to neutral.  Patient has a good good dorsalis pedis pulse patient has pressured speech and was on the phone during his examination.  There is no bony prominence across the forefoot no callus no ulcers.  There is no cellulitis no signs of infection no sausage digit swelling of the toes.  Imaging: No results found. No images are attached to the encounter.  Labs: Lab Results  Component Value Date   HGBA1C 14.5 (H) 08/26/2017     Lab Results  Component Value Date   ALBUMIN 4.6 08/17/2015    No results found for: MG No  results found for: VD25OH  No results found for: PREALBUMIN CBC EXTENDED Latest Ref Rng & Units 04/27/2020 02/16/2020 02/16/2020  WBC 4.0 - 10.5 K/uL 8.4 - -  RBC 4.22 - 5.81 MIL/uL 4.43 - -  HGB 13.0 - 17.0 g/dL 14.2 15.0 15.0  HCT 39 - 52 % 41.3 44.0 44.0  PLT 150 - 400 K/uL 237 - -  NEUTROABS 1.7 - 7.7 K/uL - - -  LYMPHSABS 0.7 - 4.0 K/uL - - -     Body mass index is 28.76 kg/m.  Orders:  Orders Placed This Encounter  Procedures  . XR Foot 2 Views Left   No orders of the defined types were placed in this encounter.    Procedures: No procedures performed  Clinical Data: No additional findings.  ROS:  All other systems negative, except as noted in the HPI. Review of Systems  Objective: Vital Signs: Ht 6\' 1"  (1.854 m)   Wt 218 lb (98.9 kg)   BMI 28.76 kg/m   Specialty Comments:  No specialty comments available.  PMFS History: Patient Active Problem List   Diagnosis Date Noted  . DM (diabetes mellitus) ([place]) 08/30/2017  . Hypocalcemia 08/26/2017  . DKA, type 2 ([place]) 08/25/2017  . Left foot pain 12/18/2016  . Hallux limitus of left foot 11/10/2016  . Osteoarthritis of spine with radiculopathy, lumbosacral  region 11/09/2016  . Perianal irritation 06/07/2016  . Incontinence of feces 06/07/2016  . Arthralgia 06/01/2016  . Anxiety state 06/01/2016  . Hemorrhoids 03/28/2016  . GERD (gastroesophageal reflux disease) 03/28/2016  . Anal discharge 03/28/2016  . Sleep disturbance 03/28/2016  . Bipolar I disorder ([place]) 03/28/2016  . Overweight (BMI 25.0-29.9) 03/28/2016  . Tobacco use disorder 03/28/2016   Past Medical History:  Diagnosis Date  . Bipolar 1 disorder ([place])   . Chicken pox   . Diverticulitis   . Diverticulosis   . Fatty liver   . GERD (gastroesophageal reflux disease)   . Hallux rigidus of left foot   . Hyperplastic colon polyp   . Internal hemorrhoids     Family History  Problem Relation Age of Onset  . Pancreatic cancer Mother   . Heart  attack Father   . Stroke Father   . Esophageal cancer Maternal Grandfather     Past Surgical History:  Procedure Laterality Date  . APPENDECTOMY    . ARTHRODESIS METATARSALPHALANGEAL JOINT (MTPJ) Left 03/11/2020   Procedure: Left hallux metatarsophalangeal arthrodesis;  Surgeon: Antonio Simmer, MD;  Location: [facility];  Service: Orthopedics;  Laterality: Left;  . HERNIA REPAIR     Social History   Occupational History  . Occupation: Disability    Comment: Personel  Tobacco Use  . Smoking status: Current Every Day Smoker    Packs/day: 0.25    Types: Cigarettes    Last attempt to quit: 08/18/2017    Years since quitting: 2.9  . Smokeless tobacco: Never Used  Vaping Use  . Vaping Use: Never used  Substance and Sexual Activity  . Alcohol use: Not Currently  . Drug use: No  . Sexual activity: Not on file

## 2020-07-22 ENCOUNTER — Encounter: Payer: Self-pay | Admitting: Orthopedic Surgery

## 2021-04-05 NOTE — Progress Notes (Signed)
No show

## 2021-04-06 ENCOUNTER — Ambulatory Visit: Payer: Medicare HMO | Admitting: Cardiology

## 2021-04-06 DIAGNOSIS — R072 Precordial pain: Secondary | ICD-10-CM | POA: Insufficient documentation

## 2021-05-29 ENCOUNTER — Emergency Department (HOSPITAL_COMMUNITY): Payer: Medicare Other

## 2021-05-29 ENCOUNTER — Other Ambulatory Visit: Payer: Self-pay

## 2021-05-29 ENCOUNTER — Encounter (HOSPITAL_COMMUNITY): Payer: Self-pay

## 2021-05-29 ENCOUNTER — Emergency Department (HOSPITAL_COMMUNITY)
Admission: EM | Admit: 2021-05-29 | Discharge: 2021-05-29 | Disposition: A | Discharge status: Home / Self Care | Payer: Medicare Other | Attending: Emergency Medicine | Admitting: Emergency Medicine

## 2021-05-29 DIAGNOSIS — L259 Unspecified contact dermatitis, unspecified cause: Secondary | ICD-10-CM | POA: Insufficient documentation

## 2021-05-29 DIAGNOSIS — Z7984 Long term (current) use of oral hypoglycemic drugs: Secondary | ICD-10-CM | POA: Diagnosis not present

## 2021-05-29 DIAGNOSIS — M791 Myalgia, unspecified site: Secondary | ICD-10-CM

## 2021-05-29 DIAGNOSIS — F17210 Nicotine dependence, cigarettes, uncomplicated: Secondary | ICD-10-CM | POA: Insufficient documentation

## 2021-05-29 DIAGNOSIS — E1110 Type 2 diabetes mellitus with ketoacidosis without coma: Secondary | ICD-10-CM | POA: Diagnosis not present

## 2021-05-29 LAB — CBC
HCT: 43.8 % (ref 39.0–52.0)
Hemoglobin: 15.2 g/dL (ref 13.0–17.0)
MCH: 32.1 pg (ref 26.0–34.0)
MCHC: 34.7 g/dL (ref 30.0–36.0)
MCV: 92.6 fL (ref 80.0–100.0)
Platelets: 265 10*3/uL (ref 150–400)
RBC: 4.73 MIL/uL (ref 4.22–5.81)
RDW: 12.4 % (ref 11.5–15.5)
WBC: 8.6 10*3/uL (ref 4.0–10.5)
nRBC: 0 % (ref 0.0–0.2)

## 2021-05-29 LAB — BASIC METABOLIC PANEL
Anion gap: 10 (ref 5–15)
BUN: 11 mg/dL (ref 6–20)
CO2: 23 mmol/L (ref 22–32)
Calcium: 9.8 mg/dL (ref 8.9–10.3)
Chloride: 104 mmol/L (ref 98–111)
Creatinine, Ser: 0.99 mg/dL (ref 0.61–1.24)
GFR, Estimated: >60 mL/min (ref >60)
Glucose, Bld: 152 mg/dL — ABNORMAL HIGH (ref 70–99)
Potassium: 3.9 mmol/L (ref 3.5–5.1)
Sodium: 137 mmol/L (ref 135–145)

## 2021-05-29 LAB — TROPONIN I (HIGH SENSITIVITY)
Troponin I (High Sensitivity): <2 ng/L (ref <18)
Troponin I (High Sensitivity): 3 ng/L (ref <18)

## 2021-05-29 MED ORDER — KETOROLAC TROMETHAMINE 15 MG/ML IJ SOLN
15.0000 mg | Freq: Once | INTRAMUSCULAR | Status: AC
  Administered 2021-05-29: 15 mg via INTRAMUSCULAR
  Filled 2021-05-29: qty 1

## 2021-05-29 MED ORDER — HYDROCORTISONE 1 % EX CREA: TOPICAL_CREAM | CUTANEOUS | 0 refills | Status: AC

## 2021-05-29 NOTE — ED Notes (Signed)
Patient discharge instructions reviewed with the patient. The patient verbalized understanding of instructions. Patient discharged. 

## 2021-05-29 NOTE — ED Provider Notes (Signed)
[facility] EMERGENCY DEPARTMENT Provider Note   CSN: [id number] Arrival date & time: 05/29/21  0451     History Chief Complaint  Patient presents with   Chest Pain    Antonio Lawson is a 60 y.o. male.  60 year old male with prior medical history as detailed below presents with multiple complaints.  He complains of generalized body aches.  He complains of chronic left foot discomfort.  He complains of itchy areas on his upper extremities that he thinks may be poison ivy.  He denies current chest pain.  He does report feeling "gassy".  Patient's multiple complaints appear to be ongoing for the last several weeks.  He denies associated fever, shortness of breath, nausea, vomiting, or other red flag symptoms.  The history is provided by the patient.  Illness Location:  Myalgia, contact dermatitis, feeling unwell Severity:  Mild Onset quality:  Gradual Duration:  3 weeks Timing:  Intermittent Progression:  Waxing and waning Chronicity:  New     Past Medical History:  Diagnosis Date   Bipolar 1 disorder ([place])    Chicken pox    Diverticulitis    Diverticulosis    Fatty liver    GERD (gastroesophageal reflux disease)    Hallux rigidus of left foot    Hyperplastic colon polyp    Internal hemorrhoids     Patient Active Problem List   Diagnosis Date Noted   Precordial pain 04/06/2021   DM (diabetes mellitus) ([place]) 08/30/2017   Hypocalcemia 08/26/2017   DKA, type 2 ([place]) 08/25/2017   Left foot pain 12/18/2016   Hallux limitus of left foot 11/10/2016   Osteoarthritis of spine with radiculopathy, lumbosacral region 11/09/2016   Perianal irritation 06/07/2016   Incontinence of feces 06/07/2016   Arthralgia 06/01/2016   Anxiety state 06/01/2016   Hemorrhoids 03/28/2016   GERD (gastroesophageal reflux disease) 03/28/2016   Anal discharge 03/28/2016   Sleep disturbance 03/28/2016   Bipolar I disorder ([place]) 03/28/2016   Overweight (BMI 25.0-29.9)  03/28/2016   Tobacco use disorder 03/28/2016    Past Surgical History:  Procedure Laterality Date   APPENDECTOMY     ARTHRODESIS METATARSALPHALANGEAL JOINT (MTPJ) Left 03/11/2020   Procedure: Left hallux metatarsophalangeal arthrodesis;  Surgeon: Wylene Simmer, MD;  Location: [facility];  Service: Orthopedics;  Laterality: Left;   HERNIA REPAIR         Family History  Problem Relation Age of Onset   Pancreatic cancer Mother    Heart attack Father    Stroke Father    Esophageal cancer Maternal Grandfather     Social History   Tobacco Use   Smoking status: Every Day    Packs/day: 0.25    Types: Cigarettes    Last attempt to quit: 08/18/2017    Years since quitting: 3.7   Smokeless tobacco: Never  Vaping Use   Vaping Use: Never used  Substance Use Topics   Alcohol use: Not Currently   Drug use: No    Home Medications Prior to Admission medications   Medication Sig Start Date End Date Taking? Authorizing Provider  hydrocortisone cream 1 % Apply to affected area 2 times daily 05/29/21  Yes Kassadee Carawan, Wallis Bamberg, MD  blood glucose meter kit and supplies Dispense based on patient and insurance preference. Use up to BID as directed. (FOR ICD-10 E10.9, E11.9). 09/07/17   Hoyt Koch, MD  diclofenac sodium (VOLTAREN) 1 % GEL Apply 2 g topically 4 (four) times daily. 09/06/17   Sharlet Salina,  Real Cons, MD  metFORMIN (GLUCOPHAGE) 1000 MG tablet Take 1 tablet (1,000 mg total) by mouth 2 (two) times daily with a meal. 09/06/17   Hoyt Koch, MD  metFORMIN (GLUCOPHAGE) 500 MG tablet Take 500 mg by mouth 2 (two) times daily. 03/25/20   [provider]  omeprazole (PRILOSEC) 40 MG capsule Take 40 mg by mouth daily. 03/04/20   [provider]  glipiZIDE (GLUCOTROL XL) 10 MG 24 hr tablet TAKE 1 TABLET (10 MG TOTAL) BY MOUTH DAILY WITH BREAKFAST. Patient not taking: Reported on 02/16/2020 10/16/17 04/28/20  Hoyt Koch, MD    Allergies     Morphine and related  Review of Systems   Review of Systems  All other systems reviewed and are negative.  Physical Exam Updated Vital Signs BP 140/89 (BP Location: Left Arm)   Pulse 75   Temp 98.6 F (37 C) (Oral)   Resp 19   Ht '6\' 1"'  (1.854 m)   Wt 99.8 kg   SpO2 99%   BMI 29.03 kg/m   Physical Exam Vitals and nursing note reviewed.  Constitutional:      General: He is not in acute distress.    Appearance: Normal appearance. He is well-developed.  HENT:     Head: Normocephalic and atraumatic.  Eyes:     Conjunctiva/sclera: Conjunctivae normal.     Pupils: Pupils are equal, round, and reactive to light.  Cardiovascular:     Rate and Rhythm: Normal rate and regular rhythm.     Heart sounds: Normal heart sounds.  Pulmonary:     Effort: Pulmonary effort is normal. No respiratory distress.     Breath sounds: Normal breath sounds.  Abdominal:     General: There is no distension.     Palpations: Abdomen is soft.     Tenderness: There is no abdominal tenderness.  Musculoskeletal:        General: No deformity. Normal range of motion.     Cervical back: Normal range of motion and neck supple.  Skin:    General: Skin is warm and dry.     Comments: Several areas of linear erythema along both upper extremities.  These areas are consistent with likely contact dermatitis -likely poison ivy.  Neurological:     General: No focal deficit present.     Mental Status: He is alert and oriented to person, place, and time.    ED Results / Procedures / Treatments   Labs (all labs ordered are listed, but only abnormal results are displayed) Labs Reviewed  BASIC METABOLIC PANEL - Abnormal; Notable for the following components:      Result Value   Glucose, Bld 152 (*)    All other components within normal limits  CBC  TROPONIN I (HIGH SENSITIVITY)  TROPONIN I (HIGH SENSITIVITY)    EKG EKG Interpretation  Date/Time:  Sunday May 29 2021 05:34:41 EDT Ventricular Rate:   73 PR Interval:  176 QRS Duration: 88 QT Interval:  372 QTC Calculation: 409 R Axis:   29 Text Interpretation: Normal sinus rhythm Normal ECG Confirmed by Dene Gentry [phone]) on 05/29/2021 9:44:46 AM  Radiology DG Chest 2 View  Result Date: 05/29/2021 CLINICAL DATA:  Chest pain EXAM: CHEST - 2 VIEW COMPARISON:  08/25/2017 FINDINGS: The heart size and mediastinal contours are within normal limits. Both lungs are clear. The visualized skeletal structures are unremarkable. IMPRESSION: No active cardiopulmonary disease. Electronically Signed   By: Kerby Moors M.D.   On: 05/29/2021 06:22  Procedures Procedures   Medications Ordered in ED Medications  ketorolac (TORADOL) 15 MG/ML injection 15 mg (has no administration in time range)    ED Course  I have reviewed the triage vital signs and the nursing notes.  Pertinent labs & imaging results that were available during my care of the patient were reviewed by me and considered in my medical decision making (see chart for details).    MDM Rules/Calculators/A&P                           MDM  MSE complete  Hari Casaus Geisinger was evaluated in Emergency Department on 05/29/2021 for the symptoms described in the history of present illness. He was evaluated in the context of the global COVID-19 pandemic, which necessitated consideration that the patient might be at risk for infection with the SARS-CoV-2 virus that causes COVID-19. Institutional protocols and algorithms that pertain to the evaluation of patients at risk for COVID-19 are in a state of rapid change based on information released by regulatory bodies including the CDC and federal and state organizations. These policies and algorithms were followed during the patient's care in the ED.  Patient presented with multiple complaints.  Patient is visibly anxious about his multiple concerns.  The majority of his complaints are fairly chronic in nature.  Patient without complaints  typical for ACS.  EKG is without evidence of acute ischemia.  Troponin x2 is nearly undetectable.  Patient does have likely areas of contact dermatitis on upper extremities.  Treatment for same is discussed extensively with the patient.  Patient is counseled regarding need for close follow-up with his PCP.  Patient without need for further ED evaluation or inpatient work-up.  Importance of close follow-up is repeatedly stressed.  Strict return precautions given and understood.  Patient appears to be reassured after ED evaluation.     Final Clinical Impression(s) / ED Diagnoses Final diagnoses:  Contact dermatitis, unspecified contact dermatitis type, unspecified trigger  Myalgia    Rx / DC Orders ED Discharge Orders          Ordered    hydrocortisone cream 1 %        05/29/21 0957             Valarie Merino, MD 05/29/21 1001

## 2021-05-29 NOTE — ED Triage Notes (Addendum)
Pt BIBA for generalized body aches. Per medic, patient also states that the pain is due to spirits or voodoo being worked on him. Pt states left foot been hurting and that it's nerve pain that is radiating to chest and causing left sided chest pain and SOB. Pt also stating he's been having lower back pain and excessive gas.   136/84 RR: 20  164 CBG 97.7 Temp SpO2 96% HR 90

## 2021-05-29 NOTE — Discharge Instructions (Addendum)
Return for any problem.  Apply hydrocortisone to affected itchy areas on your arm.  Follow-up with your regular care provider as instructed.

## 2021-06-19 ENCOUNTER — Other Ambulatory Visit: Payer: Self-pay

## 2021-06-19 ENCOUNTER — Emergency Department (HOSPITAL_COMMUNITY)
Admission: EM | Admit: 2021-06-19 | Discharge: 2021-06-19 | Disposition: A | Discharge status: Home / Self Care | Payer: Medicare Other | Attending: Emergency Medicine | Admitting: Emergency Medicine

## 2021-06-19 ENCOUNTER — Emergency Department (HOSPITAL_COMMUNITY): Payer: Medicare Other

## 2021-06-19 DIAGNOSIS — Z7984 Long term (current) use of oral hypoglycemic drugs: Secondary | ICD-10-CM | POA: Diagnosis not present

## 2021-06-19 DIAGNOSIS — K5792 Diverticulitis of intestine, part unspecified, without perforation or abscess without bleeding: Secondary | ICD-10-CM | POA: Diagnosis not present

## 2021-06-19 DIAGNOSIS — F17210 Nicotine dependence, cigarettes, uncomplicated: Secondary | ICD-10-CM | POA: Diagnosis not present

## 2021-06-19 DIAGNOSIS — E1110 Type 2 diabetes mellitus with ketoacidosis without coma: Secondary | ICD-10-CM | POA: Diagnosis not present

## 2021-06-19 DIAGNOSIS — R1032 Left lower quadrant pain: Secondary | ICD-10-CM | POA: Diagnosis present

## 2021-06-19 LAB — CBC WITH DIFFERENTIAL/PLATELET
Abs Immature Granulocytes: 0.04 10*3/uL (ref 0.00–0.07)
Basophils Absolute: 0.1 10*3/uL (ref 0.0–0.1)
Basophils Relative: 1 %
Eosinophils Absolute: 0.3 10*3/uL (ref 0.0–0.5)
Eosinophils Relative: 3 %
HCT: 40.5 % (ref 39.0–52.0)
Hemoglobin: 14.2 g/dL (ref 13.0–17.0)
Immature Granulocytes: 0 %
Lymphocytes Relative: 20 %
Lymphs Abs: 2.2 10*3/uL (ref 0.7–4.0)
MCH: 32.8 pg (ref 26.0–34.0)
MCHC: 35.1 g/dL (ref 30.0–36.0)
MCV: 93.5 fL (ref 80.0–100.0)
Monocytes Absolute: 0.8 10*3/uL (ref 0.1–1.0)
Monocytes Relative: 7 %
Neutro Abs: 7.8 10*3/uL — ABNORMAL HIGH (ref 1.7–7.7)
Neutrophils Relative %: 69 %
Platelets: 225 10*3/uL (ref 150–400)
RBC: 4.33 MIL/uL (ref 4.22–5.81)
RDW: 12.7 % (ref 11.5–15.5)
WBC: 11.2 10*3/uL — ABNORMAL HIGH (ref 4.0–10.5)
nRBC: 0 % (ref 0.0–0.2)

## 2021-06-19 LAB — CBG MONITORING, ED: Glucose-Capillary: 138 mg/dL — ABNORMAL HIGH (ref 70–99)

## 2021-06-19 LAB — COMPREHENSIVE METABOLIC PANEL
ALT: 40 U/L (ref 0–44)
AST: 23 U/L (ref 15–41)
Albumin: 3.5 g/dL (ref 3.5–5.0)
Alkaline Phosphatase: 56 U/L (ref 38–126)
Anion gap: 8 (ref 5–15)
BUN: 15 mg/dL (ref 6–20)
CO2: 21 mmol/L — ABNORMAL LOW (ref 22–32)
Calcium: 9.2 mg/dL (ref 8.9–10.3)
Chloride: 103 mmol/L (ref 98–111)
Creatinine, Ser: 0.85 mg/dL (ref 0.61–1.24)
GFR, Estimated: >60 mL/min (ref >60)
Glucose, Bld: 226 mg/dL — ABNORMAL HIGH (ref 70–99)
Potassium: 3.3 mmol/L — ABNORMAL LOW (ref 3.5–5.1)
Sodium: 132 mmol/L — ABNORMAL LOW (ref 135–145)
Total Bilirubin: 0.6 mg/dL (ref 0.3–1.2)
Total Protein: 6.6 g/dL (ref 6.5–8.1)

## 2021-06-19 LAB — URINALYSIS, ROUTINE W REFLEX MICROSCOPIC
Bacteria, UA: NONE SEEN
Bilirubin Urine: NEGATIVE
Glucose, UA: 50 mg/dL — AB
Ketones, ur: NEGATIVE mg/dL
Leukocytes,Ua: NEGATIVE
Nitrite: NEGATIVE
Protein, ur: NEGATIVE mg/dL
Specific Gravity, Urine: 1.014 (ref 1.005–1.030)
pH: 5 (ref 5.0–8.0)

## 2021-06-19 LAB — LIPASE, BLOOD: Lipase: 50 U/L (ref 11–51)

## 2021-06-19 MED ORDER — IOHEXOL 300 MG/ML  SOLN
100.0000 mL | Freq: Once | INTRAMUSCULAR | Status: AC | PRN
  Administered 2021-06-19: 100 mL via INTRAVENOUS

## 2021-06-19 MED ORDER — OXYCODONE-ACETAMINOPHEN 5-325 MG PO TABS: 1.0000 | ORAL_TABLET | Freq: Three times a day (TID) | ORAL | 0 refills | Status: DC | PRN

## 2021-06-19 MED ORDER — POTASSIUM CHLORIDE CRYS ER 20 MEQ PO TBCR
40.0000 meq | EXTENDED_RELEASE_TABLET | Freq: Once | ORAL | Status: AC
  Administered 2021-06-19: 40 meq via ORAL
  Filled 2021-06-19: qty 2

## 2021-06-19 MED ORDER — IOHEXOL 300 MG/ML  SOLN
500.0000 mL | Freq: Two times a day (BID) | INTRAMUSCULAR | Status: AC | PRN
  Administered 2021-06-19 (×2): 500 mL via ORAL

## 2021-06-19 MED ORDER — IOHEXOL 9 MG/ML PO SOLN
ORAL | Status: AC
  Filled 2021-06-19: qty 1000

## 2021-06-19 MED ORDER — AMOXICILLIN-POT CLAVULANATE 875-125 MG PO TABS: 1.0000 | ORAL_TABLET | Freq: Two times a day (BID) | ORAL | 0 refills | Status: AC

## 2021-06-19 MED ORDER — ONDANSETRON HCL 4 MG/2ML IJ SOLN: 4.0000 mg | Freq: Once | INTRAMUSCULAR | Status: DC

## 2021-06-19 MED ORDER — SODIUM CHLORIDE 0.9 % IV BOLUS
500.0000 mL | Freq: Once | INTRAVENOUS | Status: AC
  Administered 2021-06-19: 1000 mL via INTRAVENOUS

## 2021-06-19 MED ORDER — KETOROLAC TROMETHAMINE 30 MG/ML IJ SOLN
30.0000 mg | Freq: Once | INTRAMUSCULAR | Status: AC
  Administered 2021-06-19: 30 mg via INTRAVENOUS
  Filled 2021-06-19: qty 1

## 2021-06-19 NOTE — Discharge Instructions (Addendum)
Please follow-up with your primary care provider if you do not have 1 please follow-up with the [facility].  Please read the attached information on diverticulitis.  Please take full course of antibiotics.    Please use Tylenol or ibuprofen for pain.  You may use 600 mg ibuprofen every 6 hours or 1000 mg of Tylenol every 6 hours.  You may choose to alternate between the 2.  This would be most effective.  Not to exceed 4 g of Tylenol within 24 hours.  Not to exceed 3200 mg ibuprofen 24 hours.   I given you 3 tablets of Percocet to use for breakthrough pain or as needed for nighttime use.

## 2021-06-19 NOTE — ED Provider Notes (Signed)
Emergency Medicine Provider Triage Evaluation Note  HUGHEY RITTENBERRY , a 60 y.o. male  was evaluated in triage.  Pt complains of left sided abdominal pain.  States it started tonight while he was playing bingo.  Pain worse when up and moving, better when sitting still.  Denies urinary symptoms or hematuria.  No nausea/vomiting.  Does report blood in BM earlier.  Has history of diverticulitis.  Review of Systems  Positive: Abdominal pain Negative: vomiting  Physical Exam  BP 124/87 (BP Location: Left Arm)   Pulse 82   Temp 98.6 F (37 C) (Oral)   Resp 16   SpO2 97%   Gen:   Awake, no distress   Resp:  Normal effort  MSK:   Moves extremities without difficulty  Other:  Endorsed pain to left lower and lateral abdomen without focal tenderness on my exam  Medical Decision Making  Medically screening exam initiated at 5:43 AM.  Appropriate orders placed.  Perez Dirico Rivet was informed that the remainder of the evaluation will be completed by another provider, this initial triage assessment does not replace that evaluation, and the importance of remaining in the ED until their evaluation is complete.  LLQ abdominal pain.  Reported bloody BM earlier.  No urinary symptoms.  Does have history of diverticulitis.  Will obtain labs, CT.   Larene Pickett, PA-C 06/19/21 0548    Fatima Blank, MD 06/19/21 [phone]

## 2021-06-19 NOTE — ED Provider Notes (Signed)
[facility] EMERGENCY DEPARTMENT Provider Note   CSN: [id number] Arrival date & time: 06/19/21  0540     History Chief Complaint  Patient presents with   Flank Pain    Arlis Yale Kun is a 60 y.o. male.  HPI Patient is a 59 year old male with past medical history significant for bipolar, diverticulitis, diverticulosis, fatty liver, reflux hemorrhoids  Patient is presented to ER today with complaints of persistent constant left-sided lower abdominal pain he states that it started yesterday evening has been constant since.  States it seems to radiate around to his back.  States is achy constant and moderately severe.  Denies any chest pain or shortness of breath.  No lightheadedness or dizziness.  No nausea or vomiting no urinary symptoms.  No history of urinary stones.  Small amount of BRBPR earlier today when wipes.  No fevers.  No other associate symptoms.  No aggravating mitigating factors apart from worse with walking and heel strike.    Past Medical History:  Diagnosis Date   Bipolar 1 disorder ([place])    Chicken pox    Diverticulitis    Diverticulosis    Fatty liver    GERD (gastroesophageal reflux disease)    Hallux rigidus of left foot    Hyperplastic colon polyp    Internal hemorrhoids     Patient Active Problem List   Diagnosis Date Noted   Precordial pain 04/06/2021   DM (diabetes mellitus) ([place]) 08/30/2017   Hypocalcemia 08/26/2017   DKA, type 2 ([place]) 08/25/2017   Left foot pain 12/18/2016   Hallux limitus of left foot 11/10/2016   Osteoarthritis of spine with radiculopathy, lumbosacral region 11/09/2016   Perianal irritation 06/07/2016   Incontinence of feces 06/07/2016   Arthralgia 06/01/2016   Anxiety state 06/01/2016   Hemorrhoids 03/28/2016   GERD (gastroesophageal reflux disease) 03/28/2016   Anal discharge 03/28/2016   Sleep disturbance 03/28/2016   Bipolar I disorder ([place]) 03/28/2016   Overweight (BMI 25.0-29.9)  03/28/2016   Tobacco use disorder 03/28/2016    Past Surgical History:  Procedure Laterality Date   APPENDECTOMY     ARTHRODESIS METATARSALPHALANGEAL JOINT (MTPJ) Left 03/11/2020   Procedure: Left hallux metatarsophalangeal arthrodesis;  Surgeon: Wylene Simmer, MD;  Location: [facility];  Service: Orthopedics;  Laterality: Left;   HERNIA REPAIR         Family History  Problem Relation Age of Onset   Pancreatic cancer Mother    Heart attack Father    Stroke Father    Esophageal cancer Maternal Grandfather     Social History   Tobacco Use   Smoking status: Every Day    Packs/day: 0.25    Types: Cigarettes    Last attempt to quit: 08/18/2017    Years since quitting: 3.8   Smokeless tobacco: Never  Vaping Use   Vaping Use: Never used  Substance Use Topics   Alcohol use: Not Currently   Drug use: No    Home Medications Prior to Admission medications   Medication Sig Start Date End Date Taking? Authorizing Provider  amoxicillin-clavulanate (AUGMENTIN) 875-125 MG tablet Take 1 tablet by mouth every 12 (twelve) hours. 06/19/21  Yes Stoy Fenn, Ova Freshwater S, PA  oxyCODONE-acetaminophen (PERCOCET/ROXICET) 5-325 MG tablet Take 1 tablet by mouth every 8 (eight) hours as needed for severe pain. 06/19/21  Yes Nickolus Wadding S, PA  blood glucose meter kit and supplies Dispense based on patient and insurance preference. Use up to BID as directed. (FOR ICD-10 E10.9,  E11.9). 09/07/17   Hoyt Koch, MD  diclofenac sodium (VOLTAREN) 1 % GEL Apply 2 g topically 4 (four) times daily. 09/06/17   Hoyt Koch, MD  hydrocortisone cream 1 % Apply to affected area 2 times daily 05/29/21   Valarie Merino, MD  metFORMIN (GLUCOPHAGE) 1000 MG tablet Take 1 tablet (1,000 mg total) by mouth 2 (two) times daily with a meal. 09/06/17   Hoyt Koch, MD  metFORMIN (GLUCOPHAGE) 500 MG tablet Take 500 mg by mouth 2 (two) times daily. 03/25/20   [provider]  omeprazole  (PRILOSEC) 40 MG capsule Take 40 mg by mouth daily. 03/04/20   [provider]  glipiZIDE (GLUCOTROL XL) 10 MG 24 hr tablet TAKE 1 TABLET (10 MG TOTAL) BY MOUTH DAILY WITH BREAKFAST. Patient not taking: Reported on 02/16/2020 10/16/17 04/28/20  Hoyt Koch, MD    Allergies    Morphine and related  Review of Systems   Review of Systems  Constitutional:  Positive for fatigue. Negative for chills and fever.  HENT:  Negative for congestion.   Eyes:  Negative for pain.  Respiratory:  Negative for cough and shortness of breath.   Cardiovascular:  Negative for chest pain and leg swelling.  Gastrointestinal:  Positive for abdominal pain and blood in stool. Negative for nausea and vomiting.  Genitourinary:  Negative for dysuria.  Musculoskeletal:  Negative for myalgias.  Skin:  Negative for rash.  Neurological:  Negative for dizziness and headaches.   Physical Exam Updated Vital Signs BP 134/89 (BP Location: Left Arm)   Pulse 77   Temp 98.6 F (37 C) (Oral)   Resp 18   SpO2 98%   Physical Exam Vitals and nursing note reviewed.  Constitutional:      General: He is not in acute distress.    Comments: Uncomfortable 60 year old male  HENT:     Head: Normocephalic and atraumatic.     Nose: Nose normal.     Mouth/Throat:     Mouth: Mucous membranes are moist.  Eyes:     General: No scleral icterus. Cardiovascular:     Rate and Rhythm: Normal rate and regular rhythm.     Pulses: Normal pulses.     Heart sounds: Normal heart sounds.  Pulmonary:     Effort: Pulmonary effort is normal. No respiratory distress.     Breath sounds: No wheezing.  Abdominal:     Palpations: Abdomen is soft.     Tenderness: There is abdominal tenderness. There is no guarding or rebound.     Comments: Left lower quadrant tenderness to palpation.  No guarding or rebound.  Musculoskeletal:     Cervical back: Normal range of motion.     Right lower leg: No edema.     Left lower leg: No edema.   Skin:    General: Skin is warm and dry.     Capillary Refill: Capillary refill takes less than 2 seconds.  Neurological:     Mental Status: He is alert. Mental status is at baseline.  Psychiatric:        Mood and Affect: Mood normal.        Behavior: Behavior normal.    ED Results / Procedures / Treatments   Labs (all labs ordered are listed, but only abnormal results are displayed) Labs Reviewed  CBC WITH DIFFERENTIAL/PLATELET - Abnormal; Notable for the following components:      Result Value   WBC 11.2 (*)    Neutro Abs  7.8 (*)    All other components within normal limits  COMPREHENSIVE METABOLIC PANEL - Abnormal; Notable for the following components:   Sodium 132 (*)    Potassium 3.3 (*)    CO2 21 (*)    Glucose, Bld 226 (*)    All other components within normal limits  URINALYSIS, ROUTINE W REFLEX MICROSCOPIC - Abnormal; Notable for the following components:   Glucose, UA 50 (*)    Hgb urine dipstick SMALL (*)    All other components within normal limits  CBG MONITORING, ED - Abnormal; Notable for the following components:   Glucose-Capillary 138 (*)    All other components within normal limits  LIPASE, BLOOD    EKG None  Radiology CT ABDOMEN PELVIS W CONTRAST  Result Date: 06/19/2021 CLINICAL DATA:  Left flank pain. EXAM: CT ABDOMEN AND PELVIS WITH CONTRAST TECHNIQUE: Multidetector CT imaging of the abdomen and pelvis was performed using the standard protocol following bolus administration of intravenous contrast. CONTRAST:  186m OMNIPAQUE IOHEXOL 300 MG/ML SOLN, <See Chart> OMNIPAQUE IOHEXOL 300 MG/ML SOLN COMPARISON:  CT abdomen pelvis 02/16/2020 FINDINGS: Lower chest: No acute abnormality. Hepatobiliary: Hepatic steatosis. No focal liver abnormality is seen. No gallstones, gallbladder wall thickening, or biliary dilatation. Pancreas: Unremarkable. No pancreatic ductal dilatation or surrounding inflammatory changes. Spleen: Normal in size without focal  abnormality. Adrenals/Urinary Tract: Adrenal glands are unremarkable. Stable small hypodensities in the right kidney, likely representing cysts. No renal calculi or hydronephrosis. Urinary bladder is unremarkable. Stomach/Bowel: Stomach is within normal limits. Numerous colonic diverticula with a small amount of pericolonic stranding in the left lower quadrant at the junction of the sigmoid colon and descending colon. No evidence of pericolonic abscess or free air to suggest perforation. No evidence of bowel obstruction. Vascular/Lymphatic: Aortic atherosclerosis. No enlarged abdominal or pelvic lymph nodes. Reproductive: Prostate is unremarkable. Other: Left fat containing inguinal hernia. No abdominopelvic ascites. Musculoskeletal: No acute or significant osseous findings. IMPRESSION: 1. Acute uncomplicated diverticulitis in the left lower quadrant. 2. Hepatic steatosis. 3. Fat containing left inguinal hernia. Aortic Atherosclerosis (ICD10-I70.0). Electronically Signed   By: NAudie PintoM.D.   On: 06/19/2021 11:58    Procedures Procedures   Medications Ordered in ED Medications  iohexol (OMNIPAQUE) 300 MG/ML solution 500 mL (500 mLs Oral Contrast Given 06/19/21 1145)  iohexol (OMNIPAQUE) 9 MG/ML oral solution (  Contrast Given 06/19/21 1141)  iohexol (OMNIPAQUE) 300 MG/ML solution 100 mL (100 mLs Intravenous Contrast Given 06/19/21 1140)  sodium chloride 0.9 % bolus 500 mL (0 mLs Intravenous Stopped 06/19/21 1509)  ketorolac (TORADOL) 30 MG/ML injection 30 mg (30 mg Intravenous Given 06/19/21 1416)  potassium chloride SA (KLOR-CON) CR tablet 40 mEq (40 mEq Oral Given 06/19/21 1422)    ED Course  I have reviewed the triage vital signs and the nursing notes.  Pertinent labs & imaging results that were available during my care of the patient were reviewed by me and considered in my medical decision making (see chart for details).    MDM Rules/Calculators/A&P                           Patient is a 60year old male with past medical history significant for diverticulitis presented to the ER with left lower quadrant abdominal pain.  No urinary symptoms.  Small amounts of blood in stool.  Abdominal exam is notable for left lower quadrant abdominal tenderness.  CT abdomen pelvis with contrast was obtained in triage it  is positive for diverticulitis no other acute abnormalities patient is already aware of hepatic steatosis  Will provide patient with antibiotics in the form of p.o. Augmentin.  Given 4 tablets of Percocet for nighttime pain otherwise recommend Tylenol.  Lengthy discussion about need for increased fiber.  CT abdomen pelvis with contrast is notable for no complications of diverticulitis  There is specifically no perforation or abscess formed  Patient has vital signs within normal limits.  Does appear somewhat dehydrated given 500 mL of normal saline in triage room and discharged home with all questions answered best my ability.  Strict return precautions given.  Patient is tolerating p.o. at this time.   Final Clinical Impression(s) / ED Diagnoses Final diagnoses:  Diverticulitis    Rx / DC Orders ED Discharge Orders          Ordered    amoxicillin-clavulanate (AUGMENTIN) 875-125 MG tablet  Every 12 hours        06/19/21 1425    oxyCODONE-acetaminophen (PERCOCET/ROXICET) 5-325 MG tablet  Every 8 hours PRN        06/19/21 1425             Pati Gallo [facility], [place] 06/20/21 0719    Pattricia Boss, MD 06/24/21 [phone]

## 2021-06-19 NOTE — ED Triage Notes (Signed)
Pt said since yesterday he has ben having left flank pain that gets worse when he tries to get up and walk. No nausea, no vomiting. Pt said very tender to touch. 174/106 89 -p 14-rr O2- 97% 241-CBG

## 2021-09-07 ENCOUNTER — Other Ambulatory Visit: Payer: Self-pay | Admitting: Internal Medicine

## 2021-09-10 LAB — TESTOSTERONE, FREE & TOTAL
Free Testosterone: 36.7 pg/mL (ref 35.0–155.0)
Testosterone, Total, LC-MS-MS: 253 ng/dL (ref 250–1100)

## 2021-09-10 LAB — EXTRA LAV TOP TUBE

## 2021-11-07 ENCOUNTER — Other Ambulatory Visit (HOSPITAL_BASED_OUTPATIENT_CLINIC_OR_DEPARTMENT_OTHER): Payer: Self-pay

## 2021-11-07 ENCOUNTER — Other Ambulatory Visit: Payer: Self-pay

## 2021-11-07 ENCOUNTER — Emergency Department (HOSPITAL_BASED_OUTPATIENT_CLINIC_OR_DEPARTMENT_OTHER): Payer: Medicare Other | Admitting: Radiology

## 2021-11-07 ENCOUNTER — Emergency Department (HOSPITAL_BASED_OUTPATIENT_CLINIC_OR_DEPARTMENT_OTHER)
Admission: EM | Admit: 2021-11-07 | Discharge: 2021-11-07 | Disposition: A | Discharge status: Home / Self Care | Payer: Medicare Other | Attending: Emergency Medicine | Admitting: Emergency Medicine

## 2021-11-07 ENCOUNTER — Encounter (HOSPITAL_BASED_OUTPATIENT_CLINIC_OR_DEPARTMENT_OTHER): Payer: Self-pay | Admitting: Emergency Medicine

## 2021-11-07 DIAGNOSIS — R079 Chest pain, unspecified: Secondary | ICD-10-CM | POA: Diagnosis not present

## 2021-11-07 DIAGNOSIS — M545 Low back pain, unspecified: Secondary | ICD-10-CM | POA: Insufficient documentation

## 2021-11-07 DIAGNOSIS — F17200 Nicotine dependence, unspecified, uncomplicated: Secondary | ICD-10-CM | POA: Insufficient documentation

## 2021-11-07 DIAGNOSIS — Z20822 Contact with and (suspected) exposure to covid-19: Secondary | ICD-10-CM | POA: Diagnosis not present

## 2021-11-07 DIAGNOSIS — Z7984 Long term (current) use of oral hypoglycemic drugs: Secondary | ICD-10-CM | POA: Insufficient documentation

## 2021-11-07 DIAGNOSIS — E119 Type 2 diabetes mellitus without complications: Secondary | ICD-10-CM | POA: Insufficient documentation

## 2021-11-07 LAB — CBC
HCT: 44.6 % (ref 39.0–52.0)
Hemoglobin: 15.2 g/dL (ref 13.0–17.0)
MCH: 30.5 pg (ref 26.0–34.0)
MCHC: 34.1 g/dL (ref 30.0–36.0)
MCV: 89.6 fL (ref 80.0–100.0)
Platelets: 289 10*3/uL (ref 150–400)
RBC: 4.98 MIL/uL (ref 4.22–5.81)
RDW: 12.4 % (ref 11.5–15.5)
WBC: 11.4 10*3/uL — ABNORMAL HIGH (ref 4.0–10.5)
nRBC: 0 % (ref 0.0–0.2)

## 2021-11-07 LAB — URINALYSIS, ROUTINE W REFLEX MICROSCOPIC
Bilirubin Urine: NEGATIVE
Glucose, UA: NEGATIVE mg/dL
Hgb urine dipstick: NEGATIVE
Ketones, ur: NEGATIVE mg/dL
Leukocytes,Ua: NEGATIVE
Nitrite: NEGATIVE
Protein, ur: NEGATIVE mg/dL
Specific Gravity, Urine: 1.014 (ref 1.005–1.030)
pH: 5.5 (ref 5.0–8.0)

## 2021-11-07 LAB — BASIC METABOLIC PANEL
Anion gap: 12 (ref 5–15)
BUN: 12 mg/dL (ref 6–20)
CO2: 21 mmol/L — ABNORMAL LOW (ref 22–32)
Calcium: 9.7 mg/dL (ref 8.9–10.3)
Chloride: 104 mmol/L (ref 98–111)
Creatinine, Ser: 0.92 mg/dL (ref 0.61–1.24)
GFR, Estimated: >60 mL/min (ref >60)
Glucose, Bld: 145 mg/dL — ABNORMAL HIGH (ref 70–99)
Potassium: 4 mmol/L (ref 3.5–5.1)
Sodium: 137 mmol/L (ref 135–145)

## 2021-11-07 LAB — TROPONIN I (HIGH SENSITIVITY)
Troponin I (High Sensitivity): <2 ng/L (ref <18)
Troponin I (High Sensitivity): <2 ng/L (ref <18)

## 2021-11-07 LAB — RESP PANEL BY RT-PCR (FLU A&B, COVID) ARPGX2
Influenza A by PCR: NEGATIVE
Influenza B by PCR: NEGATIVE
SARS Coronavirus 2 by RT PCR: NEGATIVE

## 2021-11-07 MED ORDER — METHOCARBAMOL 500 MG PO TABS
500.0000 mg | ORAL_TABLET | Freq: Two times a day (BID) | ORAL | 0 refills | Status: DC | PRN
  Filled 2021-11-07: qty 20, 10d supply, fill #0

## 2021-11-07 NOTE — ED Triage Notes (Signed)
L side chest pain, R flank pain for several days. Pt speaking very erratically and is difficult to keep on topic.  ?

## 2021-11-07 NOTE — Discharge Instructions (Addendum)
You were seen in the emergency department for ongoing left-sided chest pain and right-sided low back pain.  You had blood work EKG and a chest x-ray that did not show an obvious explanation for your symptoms.  We will try you on a muscle relaxant.  Please schedule a primary care doctor appointment.  Return if any worsening or concerning symptoms ?

## 2021-11-07 NOTE — ED Provider Notes (Signed)
[facility] EMERGENCY DEPT Provider Note   CSN: [id number] Arrival date & time: 11/07/21  8264     History  Chief Complaint  Patient presents with   Chest Pain    Antonio Lawson is a 61 y.o. male.  He is here with various complaints.  He said the left side of his lateral chest has been hurting along with his right lower back.  It sounds like the some going on for over a year.  He also said his blood sugars have been elevated.  He feels his doctor is trying to kill him with medication.  He is trying to follow-up with an endocrinologist but has been unable to reach them.  No fevers or chills.  No cough or shortness of breath.  No abdominal pain vomiting or diarrhea.  The history is provided by the patient.  Chest Pain Pain location:  L lateral chest Pain severity:  Severe Onset quality:  Gradual Duration: Years. Timing:  Intermittent Progression:  Unchanged Relieved by:  Nothing Worsened by:  Nothing Ineffective treatments:  None tried Associated symptoms: back pain   Associated symptoms: no abdominal pain, no cough, no fever, no headache, no nausea, no shortness of breath, no syncope and no vomiting   Risk factors: diabetes mellitus and smoking       Home Medications Prior to Admission medications   Medication Sig Start Date End Date Taking? Authorizing Provider  amoxicillin-clavulanate (AUGMENTIN) 875-125 MG tablet Take 1 tablet by mouth every 12 (twelve) hours. 06/19/21   Tedd Sias, PA  blood glucose meter kit and supplies Dispense based on patient and insurance preference. Use up to BID as directed. (FOR ICD-10 E10.9, E11.9). 09/07/17   Hoyt Koch, MD  diclofenac sodium (VOLTAREN) 1 % GEL Apply 2 g topically 4 (four) times daily. 09/06/17   Hoyt Koch, MD  hydrocortisone cream 1 % Apply to affected area 2 times daily 05/29/21   Valarie Merino, MD  metFORMIN (GLUCOPHAGE) 1000 MG tablet Take 1 tablet (1,000 mg total) by mouth 2 (two)  times daily with a meal. 09/06/17   Hoyt Koch, MD  metFORMIN (GLUCOPHAGE) 500 MG tablet Take 500 mg by mouth 2 (two) times daily. 03/25/20   [provider]  omeprazole (PRILOSEC) 40 MG capsule Take 40 mg by mouth daily. 03/04/20   [provider]  oxyCODONE-acetaminophen (PERCOCET/ROXICET) 5-325 MG tablet Take 1 tablet by mouth every 8 (eight) hours as needed for severe pain. 06/19/21   Fondaw, Wylder S, PA  glipiZIDE (GLUCOTROL XL) 10 MG 24 hr tablet TAKE 1 TABLET (10 MG TOTAL) BY MOUTH DAILY WITH BREAKFAST. Patient not taking: Reported on 02/16/2020 10/16/17 04/28/20  Hoyt Koch, MD      Allergies    Morphine and related    Review of Systems   Review of Systems  Constitutional:  Negative for fever.  HENT:  Negative for sore throat.   Eyes:  Negative for visual disturbance.  Respiratory:  Negative for cough and shortness of breath.   Cardiovascular:  Positive for chest pain. Negative for syncope.  Gastrointestinal:  Negative for abdominal pain, nausea and vomiting.  Genitourinary:  Negative for dysuria.  Musculoskeletal:  Positive for back pain.  Skin:  Negative for rash.  Neurological:  Negative for headaches.   Physical Exam Updated Vital Signs BP (!) 128/97    Pulse 68    Temp 97.8 F (36.6 C) (Tympanic)    Resp 18    Ht 6'  1" (1.854 m)    Wt 101.6 kg    SpO2 99%    BMI 29.55 kg/m  Physical Exam Vitals and nursing note reviewed.  Constitutional:      General: He is not in acute distress.    Appearance: He is well-developed.  HENT:     Head: Normocephalic and atraumatic.  Eyes:     Conjunctiva/sclera: Conjunctivae normal.  Cardiovascular:     Rate and Rhythm: Normal rate and regular rhythm.     Heart sounds: Normal heart sounds. No murmur heard. Pulmonary:     Effort: Pulmonary effort is normal. No respiratory distress.     Breath sounds: Normal breath sounds.  Abdominal:     Palpations: Abdomen is soft.     Tenderness: There is no  abdominal tenderness.  Musculoskeletal:        General: No swelling.     Cervical back: Neck supple.     Right lower leg: No edema.     Left lower leg: No edema.  Skin:    General: Skin is warm and dry.     Capillary Refill: Capillary refill takes less than 2 seconds.  Neurological:     General: No focal deficit present.     Mental Status: He is alert.  Psychiatric:        Mood and Affect: Mood is anxious.    ED Results / Procedures / Treatments   Labs (all labs ordered are listed, but only abnormal results are displayed) Labs Reviewed  BASIC METABOLIC PANEL - Abnormal; Notable for the following components:      Result Value   CO2 21 (*)    Glucose, Bld 145 (*)    All other components within normal limits  CBC - Abnormal; Notable for the following components:   WBC 11.4 (*)    All other components within normal limits  RESP PANEL BY RT-PCR (FLU A&B, COVID) [id number]  URINALYSIS, ROUTINE W REFLEX MICROSCOPIC  TROPONIN I (HIGH SENSITIVITY)  TROPONIN I (HIGH SENSITIVITY)    EKG EKG Interpretation  Date/Time:  Monday November 07 2021 09:11:07 EST Ventricular Rate:  85 PR Interval:  168 QRS Duration: 82 QT Interval:  366 QTC Calculation: 435 R Axis:   52 Text Interpretation: Normal sinus rhythm Normal ECG When compared with ECG of 29-May-2021 05:34, No significant change was found Confirmed by Aletta Edouard [phone]) on 11/07/2021 9:21:31 AM  Radiology DG Chest 2 View  Result Date: 11/07/2021 CLINICAL DATA:  Chest pain and shortness of breath. EXAM: CHEST - 2 VIEW COMPARISON:  Chest radiographs 05/29/2021 FINDINGS: The cardiomediastinal silhouette is unchanged with normal heart size. No airspace consolidation, edema, pleural effusion, or pneumothorax is identified. Mild anterior basilar scarring is noted. Mild chronic T9 and L1 compression fractures are noted. IMPRESSION: No active cardiopulmonary disease. Electronically Signed   By: Logan Bores M.D.   On: 11/07/2021 10:08     Procedures Procedures    Medications Ordered in ED Medications - No data to display  ED Course/ Medical Decision Making/ A&P Clinical Course as of 11/07/21 1801  Mon Nov 07, 2021  1304 Reviewed results of blood testing with patient.  He continues to tell me about his symptoms again and how his doctor is not doing anything for him [MB]    Clinical Course User Index [MB] Hayden Rasmussen, MD  Medical Decision Making Amount and/or Complexity of Data Reviewed Labs: ordered. Radiology: ordered.  Risk Prescription drug management.  Antonio Lawson was evaluated in Emergency Department on 11/07/2021 for the symptoms described in the history of present illness. He was evaluated in the context of the global COVID-19 pandemic, which necessitated consideration that the patient might be at risk for infection with the SARS-CoV-2 virus that causes COVID-19. Institutional protocols and algorithms that pertain to the evaluation of patients at risk for COVID-19 are in a state of rapid change based on information released by regulatory bodies including the CDC and federal and state organizations. These policies and algorithms were followed during the patient's care in the ED.  This patient complains of left lateral pain, right lower back pain, ongoing anxiety, elevated blood sugars,; this involves an extensive number of treatment Options and is a complaint that carries with it a high risk of complications and morbidity. The differential includes ACS, pneumonia, musculoskeletal pain, anxiety, dehydration, hyperglycemia  I ordered, reviewed and interpreted labs, which included CBC with mildly elevated white count stable hemoglobin, chemistries normal other than mildly low bicarb elevated glucose normal renal function, troponins flat, COVID flu negative, urinalysis negative  I ordered imaging studies which included chest x-ray and I independently    visualized and  interpreted imaging which showed no acute findings Previous records obtained and reviewed in epic no recent admissions Cardiac monitoring reviewed, patient normal sinus rhythm Social determinants considered, ongoing tobacco use Critical Interventions: None  After the interventions stated above, I reevaluated the patient and found patient to be hemodynamically stable. Admission and further testing considered, no indications for admission at this time.  His complaints seem longstanding and no acute findings on exam imaging lab work-up.  Recommended close follow-up with PCP and contact information to get a PCP given.  Return instructions discussed          Final Clinical Impression(s) / ED Diagnoses Final diagnoses:  Nonspecific chest pain  Right low back pain, unspecified chronicity, unspecified whether sciatica present    Rx / DC Orders ED Discharge Orders          Ordered    methocarbamol (ROBAXIN) 500 MG tablet  2 times daily PRN        11/07/21 1306              Hayden Rasmussen, MD 11/07/21 [phone]

## 2021-11-13 ENCOUNTER — Other Ambulatory Visit: Payer: Self-pay

## 2021-11-13 ENCOUNTER — Emergency Department (HOSPITAL_COMMUNITY): Payer: Medicare Other

## 2021-11-13 ENCOUNTER — Emergency Department (HOSPITAL_COMMUNITY)
Admission: EM | Admit: 2021-11-13 | Discharge: 2021-11-13 | Disposition: A | Discharge status: Home / Self Care | Payer: Medicare Other | Attending: Emergency Medicine | Admitting: Emergency Medicine

## 2021-11-13 DIAGNOSIS — R109 Unspecified abdominal pain: Secondary | ICD-10-CM | POA: Insufficient documentation

## 2021-11-13 DIAGNOSIS — Z20822 Contact with and (suspected) exposure to covid-19: Secondary | ICD-10-CM | POA: Diagnosis not present

## 2021-11-13 DIAGNOSIS — Z794 Long term (current) use of insulin: Secondary | ICD-10-CM | POA: Diagnosis not present

## 2021-11-13 DIAGNOSIS — R079 Chest pain, unspecified: Secondary | ICD-10-CM | POA: Insufficient documentation

## 2021-11-13 DIAGNOSIS — K219 Gastro-esophageal reflux disease without esophagitis: Secondary | ICD-10-CM | POA: Insufficient documentation

## 2021-11-13 LAB — BASIC METABOLIC PANEL
Anion gap: 12 (ref 5–15)
BUN: 13 mg/dL (ref 6–20)
CO2: 21 mmol/L — ABNORMAL LOW (ref 22–32)
Calcium: 9.6 mg/dL (ref 8.9–10.3)
Chloride: 103 mmol/L (ref 98–111)
Creatinine, Ser: 0.92 mg/dL (ref 0.61–1.24)
GFR, Estimated: >60 mL/min (ref >60)
Glucose, Bld: 231 mg/dL — ABNORMAL HIGH (ref 70–99)
Potassium: 3.9 mmol/L (ref 3.5–5.1)
Sodium: 136 mmol/L (ref 135–145)

## 2021-11-13 LAB — CBC
HCT: 43.7 % (ref 39.0–52.0)
Hemoglobin: 14.9 g/dL (ref 13.0–17.0)
MCH: 31.2 pg (ref 26.0–34.0)
MCHC: 34.1 g/dL (ref 30.0–36.0)
MCV: 91.6 fL (ref 80.0–100.0)
Platelets: 296 10*3/uL (ref 150–400)
RBC: 4.77 MIL/uL (ref 4.22–5.81)
RDW: 12.2 % (ref 11.5–15.5)
WBC: 7.2 10*3/uL (ref 4.0–10.5)
nRBC: 0 % (ref 0.0–0.2)

## 2021-11-13 LAB — URINALYSIS, ROUTINE W REFLEX MICROSCOPIC
Bilirubin Urine: NEGATIVE
Glucose, UA: NEGATIVE mg/dL
Hgb urine dipstick: NEGATIVE
Ketones, ur: NEGATIVE mg/dL
Leukocytes,Ua: NEGATIVE
Nitrite: NEGATIVE
Protein, ur: NEGATIVE mg/dL
Specific Gravity, Urine: 1.016 (ref 1.005–1.030)
pH: 5 (ref 5.0–8.0)

## 2021-11-13 LAB — TROPONIN I (HIGH SENSITIVITY): Troponin I (High Sensitivity): 3 ng/L (ref <18)

## 2021-11-13 LAB — LIPASE, BLOOD: Lipase: 52 U/L — ABNORMAL HIGH (ref 11–51)

## 2021-11-13 LAB — RESP PANEL BY RT-PCR (FLU A&B, COVID) ARPGX2
Influenza A by PCR: NEGATIVE
Influenza B by PCR: NEGATIVE
SARS Coronavirus 2 by RT PCR: NEGATIVE

## 2021-11-13 LAB — CK: Total CK: 89 U/L (ref 49–397)

## 2021-11-13 MED ORDER — CYCLOBENZAPRINE HCL 5 MG PO TABS
5.0000 mg | ORAL_TABLET | Freq: Three times a day (TID) | ORAL | 0 refills | Status: DC | PRN
Start: 2021-11-13 — End: 2021-11-13

## 2021-11-13 MED ORDER — CYCLOBENZAPRINE HCL 5 MG PO TABS: 5.0000 mg | ORAL_TABLET | Freq: Three times a day (TID) | ORAL | 0 refills | Status: AC | PRN

## 2021-11-13 NOTE — ED Provider Notes (Signed)
?[facility] ?Provider Note ? ? ?CSN: [id number] ?Arrival date & time: 11/13/21  1125 ? ?  ? ?History ? ?Chief Complaint  ?Patient presents with  ? Flank Pain  ? Multiple Complaints  ? ? ?Antonio Lawson is a 61 y.o. male. ? ? ?Flank Pain ?Associated symptoms include chest pain. Pertinent negatives include no abdominal pain. Patient presents with multiple complaints.  States that been going on for a year.  States left-sided chest pain.  Has had previous fall has been seen for same without result.  Also right-sided flank pain.  Comes and goes.  No dysuria.  No fevers or chills.  Worried that he has cancer.  No fevers.  Also muscle aches.  Feeling bad.  States feels off.  Also is worried that his PCP is giving him too much insulin.  States he is on 50 units and another family member who is a nurse told him that is too much and will kill him.  Patient states however his sugars have been running high.  They have been going somewhat out of control. ? ?  ?Past Medical History:  ?Diagnosis Date  ? Bipolar 1 disorder ([place])   ? Chicken pox   ? Diverticulitis   ? Diverticulosis   ? Fatty liver   ? GERD (gastroesophageal reflux disease)   ? Hallux rigidus of left foot   ? Hyperplastic colon polyp   ? Internal hemorrhoids   ? ?Past Surgical History:  ?Procedure Laterality Date  ? APPENDECTOMY    ? ARTHRODESIS METATARSALPHALANGEAL JOINT (MTPJ) Left 03/11/2020  ? Procedure: Left hallux metatarsophalangeal arthrodesis;  Surgeon: Wylene Simmer, MD;  Location: [facility];  Service: Orthopedics;  Laterality: Left;  ? HERNIA REPAIR    ? ? ? ?Home Medications ?Prior to Admission medications   ?Medication Sig Start Date End Date Taking? Authorizing Provider  ?amoxicillin-clavulanate (AUGMENTIN) 875-125 MG tablet Take 1 tablet by mouth every 12 (twelve) hours. 06/19/21   Tedd Sias, PA  ?blood glucose meter kit and supplies Dispense based on patient and insurance preference. Use  up to BID as directed. (FOR ICD-10 E10.9, E11.9). 09/07/17   Hoyt Koch, MD  ?diclofenac sodium (VOLTAREN) 1 % GEL Apply 2 g topically 4 (four) times daily. 09/06/17   Hoyt Koch, MD  ?hydrocortisone cream 1 % Apply to affected area 2 times daily 05/29/21   Valarie Merino, MD  ?metFORMIN (GLUCOPHAGE) 1000 MG tablet Take 1 tablet (1,000 mg total) by mouth 2 (two) times daily with a meal. 09/06/17   Hoyt Koch, MD  ?metFORMIN (GLUCOPHAGE) 500 MG tablet Take 500 mg by mouth 2 (two) times daily. 03/25/20   [provider]  ?methocarbamol (ROBAXIN) 500 MG tablet Take 1 tablet (500 mg total) by mouth 2 (two) times daily as needed for muscle spasms. 11/07/21   Hayden Rasmussen, MD  ?omeprazole (PRILOSEC) 40 MG capsule Take 40 mg by mouth daily. 03/04/20   [provider]  ?oxyCODONE-acetaminophen (PERCOCET/ROXICET) 5-325 MG tablet Take 1 tablet by mouth every 8 (eight) hours as needed for severe pain. 06/19/21   Tedd Sias, PA  ?glipiZIDE (GLUCOTROL XL) 10 MG 24 hr tablet TAKE 1 TABLET (10 MG TOTAL) BY MOUTH DAILY WITH BREAKFAST. ?Patient not taking: Reported on 02/16/2020 10/16/17 04/28/20  Hoyt Koch, MD  ?   ? ?Allergies    ?Morphine and related   ? ?Review of Systems   ?Review of Systems  ?Constitutional:  Negative for fever.  ?Cardiovascular:  Positive for chest pain.  ?Gastrointestinal:  Negative for abdominal pain.  ?Genitourinary:  Positive for flank pain.  ?Musculoskeletal:  Positive for back pain.  ?Neurological:  Negative for weakness.  ?Psychiatric/Behavioral:  Negative for confusion.   ? ?Physical Exam ?Updated Vital Signs ?BP 124/79   Pulse 85   Temp (!) 97.5 ?F (36.4 ?C) (Oral)   Resp 18   SpO2 100%  ?Physical Exam ?Vitals and nursing note reviewed.  ?HENT:  ?   Head: Normocephalic.  ?Cardiovascular:  ?   Rate and Rhythm: Regular rhythm.  ?Musculoskeletal:  ?   Cervical back: Neck supple.  ?Neurological:  ?   Mental Status: He is alert.  ? ? ?ED  Results / Procedures / Treatments   ?Labs ?(all labs ordered are listed, but only abnormal results are displayed) ?Labs Reviewed  ?RESP PANEL BY RT-PCR (FLU A&B, COVID) [id number]  ?URINALYSIS, ROUTINE W REFLEX MICROSCOPIC  ?BASIC METABOLIC PANEL  ?CBC  ?LIPASE, BLOOD  ?TROPONIN I (HIGH SENSITIVITY)  ? ? ?EKG ?None ? ?Radiology ?No results found. ? ?Procedures ?Procedures  ? ? ?Medications Ordered in ED ?Medications - No data to display ? ?ED Course/ Medical Decision Making/ A&P ?  ?                        ?Medical Decision Making ?Amount and/or Complexity of Data Reviewed ?Labs: ordered. ?Radiology: ordered. ? ?Risk ?Prescription drug management. ? ? ?Patient multiple complaints.  Right flank pain.  Abdominal pain rib pain.  Has been going for months.  Also aches and pains.  Worried about COVID.  States is on insulin and is worried it is too much.  Although his sugars been running high.  Reviewing records appears to have a history of bipolar disorder appears to be may be a little manic at this time. ?Work-up reassuring.  CK added due to generalized muscle aches and was negative.  Urine does not show infection.  X-ray independently interpreted and reassuring.  Appears stable for discharge.  Mild hyperglycemia without DKA.  Will adjust muscle relaxers.  Potentially could be musculoskeletal pain.  Have follow-up with PCP as needed.  Does not appear to need mission to this time.  Reviewed previous ER notes and previous [place] from out of state ? ? ? ? ? ? ? ?Final Clinical Impression(s) / ED Diagnoses ?Final diagnoses:  ?Flank pain  ? ? ?Rx / DC Orders ?ED Discharge Orders   ? ? None  ? ?  ? ? ?  ?Davonna Belling, MD ?11/13/21 1358 ? ?

## 2021-11-13 NOTE — ED Triage Notes (Signed)
Pt here from home c/o R flank pain for "days" w/ no urinary symptoms, some nausea. Pt states pain has been moving around his abdomen. Pt also c/o L rib cage pain after falling onto a ladder 2 months ago, pt also states he visited a massage therapist and they hurt his L collar bone. Pt also is worried about having covid because of the people he lives with. Pt also believes his PCP is trying to kill him because he took him off metformin and told him to take 50 units of insulin.  ?

## 2021-11-13 NOTE — ED Notes (Signed)
Pt in er room number 7, pt states that he is here for some back and chest pain from when he fell over a ladder a month ago, but states that he was seen at [facility] and they did and x ray a few days ago.  States that he is also here for some lower back pain for the past 3-4 weeks, reports this is a constant pain, pt states that he also has L knee pain for the past 17 weeks for when he hit his knee against an ATM because he was angry.  States that he has been dx with DM in 2017 and has been taking insulin; however, he is now worried about the amount of insulin he is taking, states that over all he is stressed and worried, resps even and unlabored.   ?

## 2021-11-22 ENCOUNTER — Encounter: Payer: Self-pay | Admitting: Internal Medicine

## 2021-12-20 ENCOUNTER — Ambulatory Visit
Admission: RE | Admit: 2021-12-20 | Discharge: 2021-12-20 | Disposition: A | Discharge status: Home / Self Care | Payer: Medicare Other | Source: Ambulatory Visit | Attending: Internal Medicine | Admitting: Internal Medicine

## 2021-12-20 ENCOUNTER — Other Ambulatory Visit: Payer: Self-pay | Admitting: Internal Medicine

## 2021-12-20 DIAGNOSIS — M545 Low back pain, unspecified: Secondary | ICD-10-CM

## 2022-01-16 ENCOUNTER — Ambulatory Visit: Payer: Medicare Other | Admitting: Podiatry

## 2022-01-24 ENCOUNTER — Ambulatory Visit: Payer: Medicare Other | Admitting: Podiatry

## 2022-04-26 ENCOUNTER — Encounter (HOSPITAL_COMMUNITY): Payer: Self-pay | Admitting: Emergency Medicine

## 2022-04-26 ENCOUNTER — Emergency Department (HOSPITAL_COMMUNITY)
Admission: EM | Admit: 2022-04-26 | Discharge: 2022-04-26 | Disposition: A | Discharge status: Home / Self Care | Payer: Medicare Other | Attending: Emergency Medicine | Admitting: Emergency Medicine

## 2022-04-26 DIAGNOSIS — R109 Unspecified abdominal pain: Secondary | ICD-10-CM | POA: Diagnosis not present

## 2022-04-26 DIAGNOSIS — M545 Low back pain, unspecified: Secondary | ICD-10-CM | POA: Insufficient documentation

## 2022-04-26 DIAGNOSIS — M25512 Pain in left shoulder: Secondary | ICD-10-CM | POA: Insufficient documentation

## 2022-04-26 DIAGNOSIS — M25551 Pain in right hip: Secondary | ICD-10-CM | POA: Diagnosis not present

## 2022-04-26 DIAGNOSIS — M25511 Pain in right shoulder: Secondary | ICD-10-CM | POA: Insufficient documentation

## 2022-04-26 DIAGNOSIS — M791 Myalgia, unspecified site: Secondary | ICD-10-CM | POA: Diagnosis not present

## 2022-04-26 DIAGNOSIS — M25552 Pain in left hip: Secondary | ICD-10-CM | POA: Insufficient documentation

## 2022-04-26 LAB — CBC WITH DIFFERENTIAL/PLATELET
Abs Immature Granulocytes: 0.02 10*3/uL (ref 0.00–0.07)
Basophils Absolute: 0.1 10*3/uL (ref 0.0–0.1)
Basophils Relative: 1 %
Eosinophils Absolute: 0.3 10*3/uL (ref 0.0–0.5)
Eosinophils Relative: 3 %
HCT: 47 % (ref 39.0–52.0)
Hemoglobin: 16.7 g/dL (ref 13.0–17.0)
Immature Granulocytes: 0 %
Lymphocytes Relative: 35 %
Lymphs Abs: 3.5 10*3/uL (ref 0.7–4.0)
MCH: 31.9 pg (ref 26.0–34.0)
MCHC: 35.5 g/dL (ref 30.0–36.0)
MCV: 89.7 fL (ref 80.0–100.0)
Monocytes Absolute: 0.6 10*3/uL (ref 0.1–1.0)
Monocytes Relative: 6 %
Neutro Abs: 5.5 10*3/uL (ref 1.7–7.7)
Neutrophils Relative %: 55 %
Platelets: 272 10*3/uL (ref 150–400)
RBC: 5.24 MIL/uL (ref 4.22–5.81)
RDW: 12.2 % (ref 11.5–15.5)
WBC: 9.9 10*3/uL (ref 4.0–10.5)
nRBC: 0 % (ref 0.0–0.2)

## 2022-04-26 LAB — COMPREHENSIVE METABOLIC PANEL
ALT: 31 U/L (ref 0–44)
AST: 21 U/L (ref 15–41)
Albumin: 4.6 g/dL (ref 3.5–5.0)
Alkaline Phosphatase: 48 U/L (ref 38–126)
Anion gap: 7 (ref 5–15)
BUN: 11 mg/dL (ref 6–20)
CO2: 22 mmol/L (ref 22–32)
Calcium: 9.9 mg/dL (ref 8.9–10.3)
Chloride: 108 mmol/L (ref 98–111)
Creatinine, Ser: 0.88 mg/dL (ref 0.61–1.24)
GFR, Estimated: >60 mL/min (ref >60)
Glucose, Bld: 104 mg/dL — ABNORMAL HIGH (ref 70–99)
Potassium: 3.4 mmol/L — ABNORMAL LOW (ref 3.5–5.1)
Sodium: 137 mmol/L (ref 135–145)
Total Bilirubin: 0.9 mg/dL (ref 0.3–1.2)
Total Protein: 7.8 g/dL (ref 6.5–8.1)

## 2022-04-26 LAB — URINALYSIS, ROUTINE W REFLEX MICROSCOPIC
Bacteria, UA: NONE SEEN
Bilirubin Urine: NEGATIVE
Glucose, UA: >=500 mg/dL — AB
Hgb urine dipstick: NEGATIVE
Ketones, ur: NEGATIVE mg/dL
Leukocytes,Ua: NEGATIVE
Nitrite: NEGATIVE
Protein, ur: NEGATIVE mg/dL
Specific Gravity, Urine: 1.022 (ref 1.005–1.030)
pH: 5 (ref 5.0–8.0)

## 2022-04-26 LAB — LIPASE, BLOOD: Lipase: 74 U/L — ABNORMAL HIGH (ref 11–51)

## 2022-04-26 MED ORDER — KETOROLAC TROMETHAMINE 15 MG/ML IJ SOLN
15.0000 mg | Freq: Once | INTRAMUSCULAR | Status: AC
  Administered 2022-04-26: 15 mg via INTRAVENOUS
  Filled 2022-04-26: qty 1

## 2022-04-26 NOTE — ED Triage Notes (Signed)
Patient complains of right lower back pain that started three months ago. Patient is alert, oriented, ambulatory, and in no apparent distress at this time.

## 2022-04-26 NOTE — ED Provider Notes (Signed)
[facility] EMERGENCY DEPARTMENT Provider Note   CSN: [id number] Arrival date & time: 04/26/22  1533     History  Chief Complaint  Patient presents with   Back Pain    Antonio Lawson is a 61 y.o. male.  61 year old male with prior medical history as detailed below presents for evaluation.  Patient complains of a diffuse array of myalgias that have been present for the last 3 to 6 months or longer.  It is somewhat unclear what led the patient to come to the ED tonight for evaluation.  Patient with mildly pressured speech.  However, he is oriented and directable and appropriate.  Patient reports pain to his shoulders and back, pain to his flank and hips.  This pain has been ongoing variable problem for several months.  He denies current chest pain or shortness of breath.  He denies abdominal pain or nausea or vomiting.  Denies urinary symptoms.  The history is provided by the patient and medical records.       Home Medications Prior to Admission medications   Medication Sig Start Date End Date Taking? Authorizing Provider  amoxicillin-clavulanate (AUGMENTIN) 875-125 MG tablet Take 1 tablet by mouth every 12 (twelve) hours. 06/19/21   Tedd Sias, PA  blood glucose meter kit and supplies Dispense based on patient and insurance preference. Use up to BID as directed. (FOR ICD-10 E10.9, E11.9). 09/07/17   Hoyt Koch, MD  cyclobenzaprine (FLEXERIL) 5 MG tablet Take 1 tablet (5 mg total) by mouth 3 (three) times daily as needed for muscle spasms. 11/13/21   Davonna Belling, MD  diclofenac sodium (VOLTAREN) 1 % GEL Apply 2 g topically 4 (four) times daily. 09/06/17   Hoyt Koch, MD  hydrocortisone cream 1 % Apply to affected area 2 times daily 05/29/21   Valarie Merino, MD  metFORMIN (GLUCOPHAGE) 1000 MG tablet Take 1 tablet (1,000 mg total) by mouth 2 (two) times daily with a meal. 09/06/17   Hoyt Koch, MD  metFORMIN  (GLUCOPHAGE) 500 MG tablet Take 500 mg by mouth 2 (two) times daily. 03/25/20   [provider]  omeprazole (PRILOSEC) 40 MG capsule Take 40 mg by mouth daily. 03/04/20   [provider]  oxyCODONE-acetaminophen (PERCOCET/ROXICET) 5-325 MG tablet Take 1 tablet by mouth every 8 (eight) hours as needed for severe pain. 06/19/21   Fondaw, Wylder S, PA  glipiZIDE (GLUCOTROL XL) 10 MG 24 hr tablet TAKE 1 TABLET (10 MG TOTAL) BY MOUTH DAILY WITH BREAKFAST. Patient not taking: Reported on 02/16/2020 10/16/17 04/28/20  Hoyt Koch, MD      Allergies    Morphine and related    Review of Systems   Review of Systems  All other systems reviewed and are negative.   Physical Exam Updated Vital Signs BP (!) 126/97   Pulse 73   Temp 98.1 F (36.7 C) (Oral)   Resp 18   SpO2 98%  Physical Exam Vitals and nursing note reviewed.  Constitutional:      General: He is not in acute distress.    Appearance: Normal appearance. He is well-developed.  HENT:     Head: Normocephalic and atraumatic.  Eyes:     Conjunctiva/sclera: Conjunctivae normal.     Pupils: Pupils are equal, round, and reactive to light.  Cardiovascular:     Rate and Rhythm: Normal rate and regular rhythm.     Heart sounds: Normal heart sounds.  Pulmonary:  Effort: Pulmonary effort is normal. No respiratory distress.     Breath sounds: Normal breath sounds.  Abdominal:     General: There is no distension.     Palpations: Abdomen is soft.     Tenderness: There is no abdominal tenderness.  Musculoskeletal:        General: No deformity. Normal range of motion.     Cervical back: Normal range of motion and neck supple.  Skin:    General: Skin is warm and dry.  Neurological:     General: No focal deficit present.     Mental Status: He is alert and oriented to person, place, and time.     ED Results / Procedures / Treatments   Labs (all labs ordered are listed, but only abnormal results are  displayed) Labs Reviewed  COMPREHENSIVE METABOLIC PANEL - Abnormal; Notable for the following components:      Result Value   Potassium 3.4 (*)    Glucose, Bld 104 (*)    All other components within normal limits  LIPASE, BLOOD - Abnormal; Notable for the following components:   Lipase 74 (*)    All other components within normal limits  URINALYSIS, ROUTINE W REFLEX MICROSCOPIC - Abnormal; Notable for the following components:   Glucose, UA >=500 (*)    All other components within normal limits  CBC WITH DIFFERENTIAL/PLATELET    EKG None  Radiology No results found.  Procedures Procedures    Medications Ordered in ED Medications  ketorolac (TORADOL) 15 MG/ML injection 15 mg (15 mg Intravenous Given 04/26/22 2142)    ED Course/ Medical Decision Making/ A&P                           Medical Decision Making Risk Prescription drug management.    Medical Screen Complete  This patient presented to the ED with complaint of myalgia.  This complaint involves an extensive number of treatment options. The initial differential diagnosis includes, but is not limited to, metabolic abnormality, rheumatologic condition, etc.  This presentation is: Chronic, Self-Limited, Previously Undiagnosed, Uncertain Prognosis, Complicated, and Systemic Symptoms  Patient is presenting to the ED with a host of chronic complaints primarily involving around diffuse myalgias of various ages and locations.  Presentation is not consistent with acute pathology. Screening labs obtained are without significant acute abnormality.  Patient is advised that close outpatient follow-up with his outpatient care provider and or a possible rheumatologic evaluation would be appropriate.  Strict return precautions given and understood.  Importance of close follow-up is repeatedly stressed.   Additional history obtained: External records from outside sources obtained and reviewed including prior ED visits and  prior Inpatient records.    Lab Tests:  I ordered and personally interpreted labs.  The pertinent results include: CBC, CMP, lipase  Medicines ordered:  I ordered medication including Toradol for pain  Reevaluation of the patient after these medicines showed that the patient: improved  Problem List / ED Course:  Myalgia   Reevaluation:  After the interventions noted above, I reevaluated the patient and found that they have: improved   Disposition:  After consideration of the diagnostic results and the patients response to treatment, I feel that the patent would benefit from close outpatient followup.          Final Clinical Impression(s) / ED Diagnoses Final diagnoses:  Myalgia    Rx / DC Orders ED Discharge Orders     None  Valarie Merino, MD 04/26/22 [phone]

## 2022-04-26 NOTE — Discharge Instructions (Signed)
  Return for any problem.  Follow-up closely with your regular outpatient care provider as instructed.  You may benefit from a rheumatologic evaluation.  This can be arranged either with your regular doctor or with a rheumatologist.

## 2022-04-26 NOTE — ED Provider Triage Note (Signed)
Emergency Medicine Provider Triage Evaluation Note  Antonio Lawson , a 61 y.o. male  was evaluated in triage.  Pt complains of right low back pain.  Very difficult to obtain HPI.  Patient states pain has been present for the past 3 months.  He is afraid his kidneys are causing the pain.  No history of kidney stones.  No urinary symptoms.  Review of Systems  Positive: Back pain Negative: fever  Physical Exam  BP (!) 137/104   Pulse 89   Temp 98.2 F (36.8 C) (Oral)   Resp 18   SpO2 96%  Gen:   Awake, no distress   Resp:  Normal effort  MSK:   Moves extremities without difficulty  Other:    Medical Decision Making  Medically screening exam initiated at 4:04 PM.  Appropriate orders placed.  Antonio Lawson was informed that the remainder of the evaluation will be completed by another provider, this initial triage assessment does not replace that evaluation, and the importance of remaining in the ED until their evaluation is complete.  labs   Suzy Bouchard, [place] 04/26/22 1606

## 2022-05-24 ENCOUNTER — Ambulatory Visit (HOSPITAL_COMMUNITY)
Admission: EM | Admit: 2022-05-24 | Discharge: 2022-05-24 | Disposition: A | Discharge status: Home / Self Care | Payer: Medicare Other | Attending: Psychiatry | Admitting: Psychiatry

## 2022-05-24 DIAGNOSIS — F4323 Adjustment disorder with mixed anxiety and depressed mood: Secondary | ICD-10-CM

## 2022-05-24 NOTE — ED Notes (Signed)
Patient discharged to home by provider Beatriz Stallion, NP with written and verbal instructions.

## 2022-05-24 NOTE — Discharge Instructions (Addendum)

## 2022-05-24 NOTE — ED Provider Notes (Signed)
Behavioral Health Urgent Care Medical Screening Exam  Patient Name: Antonio Lawson MRN: [id number] Date of Evaluation: 05/24/22 Chief Complaint:   Diagnosis:  Final diagnoses:  Adjustment disorder with mixed anxiety and depressed mood    History of Present illness: Antonio Lawson is a 61 y.o. male. Patient presents voluntarily to [facility] behavioral health for walk-in assessment.  Patient is assessed, face-to-face, by nurse practitioner, seated in assessment area, no acute distress.  He  is alert and oriented, pleasant and cooperative during assessment.   Patient reports "I do not know why my probation officer told me to come, he said I need an evaluation."  He reports he was feeling lonely after the death of his mother 2007/12/15 so he "got a dog."  He states "I am trying to move forward with my life.  It is sometimes overwhelming.  I want to get a part-time job but I have no car and transportation is difficult."  Antonio Lawson has been diagnosed with bipolar 1 disorder as well as major depressive disorder.  He is not linked with outpatient psychiatry currently.  He verbalizes plan to follow-up with [facility] outpatient in [place].  He reports he has been followed by Dr. Josph Macho at [facility] in the past, would like to restart outpatient follow-up with that team.  He reports he had been prescribed Seroquel however stopped this medication because it made him "too sleepy."  He denies history of inpatient psychiatric hospitalization.  No family mental health history reported.  Patient  presents with euthymic mood, congruent affect. He  denies suicidal and homicidal ideations. Denies history of suicide attempts, denies history of nonsuicidal self-harm behavior.  Patient easily  contracts verbally for safety with this Probation officer.    Patient has normal speech and behavior.  He  denies auditory and visual hallucinations.  Patient is able to converse coherently with goal-directed thoughts and no  distractibility or preoccupation.  Denies symptoms of paranoia.  Objectively there is no evidence of psychosis/mania or delusional thinking.  Antonio Lawson resides in [place] with his aunt and father-in-law.  He receives disability income, he also assists in the care of his father-in-law who has been diagnosed with dementia.  Reports plan to seek employment outside the home moving forward.  Patient reports he has had some difficulty seeking employment related to his history of registered sex offender status.  Patient endorses average sleep and appetite.  He denies alcohol and substance use.  He reports history of alcohol use disorder, most recent alcohol use approximately 20 years ago.  Patient offered support and encouragement.  He gives verbal consent to speak with his aunt, Leane Para phone number [phone], of the home.  Attempted to reach patient's father x2, HIPAA compliant voicemail left.   Patient educated and verbalizes understanding of mental health resources and other crisis services in the community. They are instructed to call 911 and present to the nearest emergency room should patient experience any suicidal/homicidal ideation, auditory/visual/hallucinations, or detrimental worsening of mental health condition.     Psychiatric Specialty Exam  Presentation  General Appearance:Appropriate for Environment; Casual  Eye Contact:Good  Speech:Clear and Coherent  Speech Volume:Normal  Handedness:Right   Mood and Affect  Mood:Euthymic  Affect:Appropriate; Congruent   Thought Process  Thought Processes:Coherent; Goal Directed; Linear  Descriptions of Associations:Intact  Orientation:Full (Time, Place and Person)  Thought Content:Logical; WDL    Hallucinations:None  Ideas of Reference:None  Suicidal Thoughts:No  Homicidal Thoughts:No   Sensorium  Memory:Immediate Good; Recent Good  Judgment:Good  Insight:Fair  Executive Functions   Concentration:Good  Attention Span:Good  [place]ora of Knowledge:Good  Language:Good   Psychomotor Activity  Psychomotor Activity:Normal   Assets  Assets:Communication Skills; Desire for Improvement; Financial Resources/Insurance; Housing; Intimacy; Leisure Time; Resilience; Social Support   Sleep  Sleep:Fair  Number of hours: No data recorded  No data recorded  Physical Exam: Physical Exam Vitals and nursing note reviewed.  Constitutional:      Appearance: Normal appearance. He is well-developed and normal weight.  HENT:     Head: Normocephalic and atraumatic.     Nose: Nose normal.  Cardiovascular:     Rate and Rhythm: Normal rate.  Pulmonary:     Effort: Pulmonary effort is normal.  Musculoskeletal:        General: Normal range of motion.     Cervical back: Normal range of motion.  Skin:    General: Skin is warm and dry.  Neurological:     Mental Status: He is alert and oriented to person, place, and time.  Psychiatric:        Attention and Perception: Attention and perception normal.        Mood and Affect: Mood and affect normal.        Speech: Speech normal.        Behavior: Behavior normal. Behavior is cooperative.        Thought Content: Thought content normal.        Cognition and Memory: Cognition and memory normal.    Review of Systems  Constitutional: Negative.   HENT: Negative.    Eyes: Negative.   Respiratory: Negative.    Cardiovascular: Negative.   Gastrointestinal: Negative.   Genitourinary: Negative.   Musculoskeletal: Negative.   Skin: Negative.   Neurological: Negative.   Psychiatric/Behavioral: Negative.     Blood pressure 127/81, pulse 90, temperature 98.1 F (36.7 C), temperature source Oral, resp. rate 18, SpO2 100 %. There is no height or weight on file to calculate BMI.  Musculoskeletal: Strength & Muscle Tone: within normal limits Gait & Station: normal Patient leans: N/A   [facility] Discharge Disposition  for Follow up and Recommendations: Based on my evaluation the patient does not appear to have an emergency medical condition and can be discharged with resources and follow up care in outpatient services for Medication Management and Individual Therapy Patient reviewed with Dr. Lynden Ang. Follow-up with outpatient psychiatry, resources provided.   Lucky Rathke, FNP 05/24/2022, 1:57 PM

## 2022-05-24 NOTE — BH Assessment (Addendum)
Comprehensive Clinical Assessment (CCA) Screening, Triage and Referral Note  05/24/2022 Antonio Lawson [id number]  Screening and Triage completed. Patient is Routine. Requested staff to please room patient.       Chief Complaint: Engineer, manufacturing systems referred patient for the [facility] for a mental health assessment.   Visit Diagnosis:  Patient Reported Information How did you hear about [place]? Other (Comment)  What Is the Reason for Your Visit/Call Today? Antonio Lawson is a 61 y/o male presenting to the [facility]. He is voluntary and states that he was referred here by his probation officer for a mental health assessment. Patient explains that the reason for being on probation is because he is a registered sex offender. States that he violated his sex offender agreement to stay away from specific school zones. Consequently, he was placed on probation 3 weeks ago. Since being placed on probation he was told that he needed to have an assessment here. Per patient, "I'm just doing what I was told to do so I don't get in trouble with probation or the courts". He does not appear to be in any distress. Denies suicidal and homicidal ideations. Also, depressive symptoms. His stressor is not being able to find a job and financial issues. Also, denies AVH's. Denies history of alcohol/drug use.  How Long Has This Been Causing You Problems? > than 6 months  What Do You Feel Would Help You the Most Today? No data recorded  Have You Recently Had Any Thoughts About Hurting Yourself? No data recorded Are You Planning to Commit Suicide/Harm Yourself At This time? No   Have you Recently Had Thoughts About [place]? No  Are You Planning to Harm Someone at This Time? No  Explanation: No data recorded  Have You Used Any Alcohol or Drugs in the Past 24 Hours? No  How Long Ago Did You Use Drugs or Alcohol? No data recorded What Did You Use and How Much? No data recorded  Do You Currently Have a  Therapist/Psychiatrist? No data recorded Name of Therapist/Psychiatrist: No data recorded  Have You Been Recently Discharged From Any Office Practice or Programs? No data recorded Explanation of Discharge From Practice/Program: No data recorded   CCA Screening Triage Referral Assessment Type of Contact: No data recorded Telemedicine Service Delivery:   Is this Initial or Reassessment? No data recorded Date Telepsych consult ordered in [facility]:  No data recorded Time Telepsych consult ordered in [facility]:  No data recorded Location of Assessment: No data recorded Provider Location: No data recorded  Collateral Involvement: No data recorded  Does Patient Have a Harris? No data recorded Name and Contact of Legal Guardian: No data recorded If Minor and Not Living with Parent(s), Who has Custody? No data recorded Is CPS involved or ever been involved? No data recorded Is APS involved or ever been involved? No data recorded  Patient Determined To Be At Risk for Harm To Self or Others Based on Review of Patient Reported Information or Presenting Complaint? No data recorded Method: No data recorded Availability of Means: No data recorded Intent: No data recorded Notification Required: No data recorded Additional Information for Danger to Others Potential: No data recorded Additional Comments for Danger to Others Potential: No data recorded Are There Guns or Other Weapons in Your Home? No data recorded Types of Guns/Weapons: No data recorded Are These Weapons Safely Secured?  No data recorded Who Could Verify You Are Able To Have These Secured: No data recorded Do You Have any Outstanding Charges, Pending Court Dates, Parole/Probation? No data recorded Contacted To Inform of Risk of Harm To Self or Others: No data recorded  Does Patient Present under Involuntary Commitment? No data recorded IVC Papers Initial File Date: No data recorded  [place]  of Residence: No data recorded  Patient Currently Receiving the Following Services: No data recorded  Determination of Need: Routine (7 days)   Options For Referral: No data recorded  Discharge Disposition:     Antonio Lawson, Counselor

## 2022-05-26 ENCOUNTER — Telehealth (HOSPITAL_COMMUNITY): Payer: Self-pay | Admitting: Family Medicine

## 2022-05-26 NOTE — BH Assessment (Signed)
Care Management - [facility] Follow Up Discharges   Writer attempted to make contact with patient today and was unsuccessful.  Writer left a HIPPA compliant voice message.   Per chart review, patient verbalizes a plan to follow-up with [facility] outpatient in [place].

## 2022-05-31 ENCOUNTER — Emergency Department (HOSPITAL_BASED_OUTPATIENT_CLINIC_OR_DEPARTMENT_OTHER)
Admission: EM | Admit: 2022-05-31 | Discharge: 2022-05-31 | Discharge status: Against Medical Advice | Payer: Medicare Other | Attending: Emergency Medicine | Admitting: Emergency Medicine

## 2022-05-31 ENCOUNTER — Other Ambulatory Visit: Payer: Self-pay

## 2022-05-31 ENCOUNTER — Encounter (HOSPITAL_BASED_OUTPATIENT_CLINIC_OR_DEPARTMENT_OTHER): Payer: Self-pay | Admitting: Emergency Medicine

## 2022-05-31 ENCOUNTER — Emergency Department (HOSPITAL_BASED_OUTPATIENT_CLINIC_OR_DEPARTMENT_OTHER): Payer: Medicare Other

## 2022-05-31 DIAGNOSIS — Z79899 Other long term (current) drug therapy: Secondary | ICD-10-CM | POA: Insufficient documentation

## 2022-05-31 DIAGNOSIS — R109 Unspecified abdominal pain: Secondary | ICD-10-CM | POA: Diagnosis not present

## 2022-05-31 DIAGNOSIS — Z5321 Procedure and treatment not carried out due to patient leaving prior to being seen by health care provider: Secondary | ICD-10-CM | POA: Insufficient documentation

## 2022-05-31 DIAGNOSIS — M791 Myalgia, unspecified site: Secondary | ICD-10-CM | POA: Diagnosis present

## 2022-05-31 DIAGNOSIS — R079 Chest pain, unspecified: Secondary | ICD-10-CM

## 2022-05-31 DIAGNOSIS — R10A0 Flank pain, unspecified side: Secondary | ICD-10-CM

## 2022-05-31 DIAGNOSIS — R52 Pain, unspecified: Secondary | ICD-10-CM

## 2022-05-31 NOTE — ED Notes (Signed)
Call from [facility]. Phone .[phone] Stated they provide eval and care for this patient.

## 2022-05-31 NOTE — ED Notes (Signed)
Patient is stating his "body is too tender to stick needles in" and he "cant lay in that machine for a Ct scan because that's just not good for me" " I just cant sit here and if the doctor isnt gonna just give me a strong ABT then I'm just going to go"  MD aware

## 2022-05-31 NOTE — ED Notes (Signed)
Patient left prior to receiving d/c paperwork.

## 2022-05-31 NOTE — ED Notes (Signed)
Patient called from room and complained of trouble breathing. Patient assessed and BBS clear, patient able to complete sentences without difficulty, and no respiratory distress noted. RT assessment complete.

## 2022-05-31 NOTE — Discharge Instructions (Signed)
Your history and exam today led [place] to order a multitude of work-up including labs, chest x-ray for your chest pain, labs to evaluate your chest pain, CT imaging to evaluate your abdominal pain and flank pain, urinalysis, and EKG.  You refused all of the work-up we tried to do and report you are not interested in any testing at this time.  Without doing work-up, I cannot tell you do not have some other concerning problem going on although your description of the timeline makes me more concerned about some inflammatory process that is more chronic in nature.  As you did not want to let us do the testing or do further evaluation, you are leaving [place].  Please follow-up with your primary doctor or even call for a new PCP and I would also recommend following with your mental health provider.  If any symptoms change or worsen acutely or you change your mind and want further work-up, please return to the nearest emergency department.

## 2022-05-31 NOTE — ED Triage Notes (Addendum)
Pt arrived POV, caox4 and ambulatory. Pt c/o generalized bodyaches that have been on-going for approx 6 months. Pt was evaluated for same complaints in August. No recent injury/trauma.  Pt has pressured speech in triage. Pt made statements such as "the medicine I was taking before may have been poisoning me," "maybe I ate something that poisoned me," and that he is worried because "voices are telling him his pain may be cancer or he may be going blind."   Pt states he has not been taking home meds.

## 2022-05-31 NOTE — ED Provider Notes (Signed)
[place] EMERGENCY DEPT Provider Note   CSN: [id number] Arrival date & time: 05/31/22  1004     History  Chief Complaint  Patient presents with   Generalized Body Aches    Antonio Lawson is a 61 y.o. male.  The history is provided by the patient and medical records. No language interpreter was used.  Illness Location:  Diffuse areas of pains in his right flank, chest, abdomen, legs, arms, all over Quality:  Waxing and waning and moving Severity:  Moderate Onset quality:  Gradual Duration:  6 months Timing:  Intermittent Progression:  Waxing and waning Chronicity:  Chronic Associated symptoms: abdominal pain, chest pain, fatigue, myalgias and nausea   Associated symptoms: no congestion, no cough, no diarrhea, no fever, no headaches, no loss of consciousness, no rash, no rhinorrhea, no shortness of breath, no vomiting and no wheezing        Home Medications Prior to Admission medications   Medication Sig Start Date End Date Taking? Authorizing Provider  amoxicillin-clavulanate (AUGMENTIN) 875-125 MG tablet Take 1 tablet by mouth every 12 (twelve) hours. 06/19/21   Tedd Sias, PA  atorvastatin (LIPITOR) 10 MG tablet Take 10 mg by mouth every other day. 04/28/22   [provider]  blood glucose meter kit and supplies Dispense based on patient and insurance preference. Use up to BID as directed. (FOR ICD-10 E10.9, E11.9). 09/07/17   Hoyt Koch, MD  celecoxib (CELEBREX) 100 MG capsule Take 100 mg by mouth 2 (two) times daily as needed. 02/06/22   [provider]  cyclobenzaprine (FLEXERIL) 5 MG tablet Take 1 tablet (5 mg total) by mouth 3 (three) times daily as needed for muscle spasms. 11/13/21   Davonna Belling, MD  diclofenac sodium (VOLTAREN) 1 % GEL Apply 2 g topically 4 (four) times daily. 09/06/17   Hoyt Koch, MD  gabapentin (NEURONTIN) 300 MG capsule Take by mouth. 05/09/22   [provider]  gabapentin  (NEURONTIN) 600 MG tablet Take 600 mg by mouth at bedtime. 04/28/22   [provider]  hydrocortisone cream 1 % Apply to affected area 2 times daily 05/29/21   Valarie Merino, MD  JARDIANCE 25 MG TABS tablet Take 25 mg by mouth once. 04/12/22   [provider]  losartan (COZAAR) 25 MG tablet Take 12.5 mg by mouth daily. 05/25/22   [provider]  metFORMIN (GLUCOPHAGE) 1000 MG tablet Take 1 tablet (1,000 mg total) by mouth 2 (two) times daily with a meal. 09/06/17   Hoyt Koch, MD  metFORMIN (GLUCOPHAGE) 500 MG tablet Take 500 mg by mouth 2 (two) times daily. 03/25/20   [provider]  omeprazole (PRILOSEC) 20 MG capsule Take 20 mg by mouth daily. 05/18/22   [provider]  omeprazole (PRILOSEC) 40 MG capsule Take 40 mg by mouth daily. 03/04/20   [provider]  oxyCODONE-acetaminophen (PERCOCET/ROXICET) 5-325 MG tablet Take 1 tablet by mouth every 8 (eight) hours as needed for severe pain. 06/19/21   Fondaw, Wylder S, PA  OZEMPIC, 0.25 OR 0.5 MG/DOSE, 2 MG/3ML SOPN Inject into the skin. 04/27/22   [provider]  rosuvastatin (CRESTOR) 10 MG tablet Take 10 mg by mouth daily. 03/27/22   [provider]  tiZANidine (ZANAFLEX) 4 MG tablet Take 4 mg by mouth at bedtime as needed. 04/17/22   [provider]  TOUJEO MAX SOLOSTAR 300 UNIT/ML Solostar Pen 40 units sq qd 05/04/22   [provider]  zolpidem (  AMBIEN) 10 MG tablet Take 10 mg by mouth at bedtime. 04/12/22   [provider]  glipiZIDE (GLUCOTROL XL) 10 MG 24 hr tablet TAKE 1 TABLET (10 MG TOTAL) BY MOUTH DAILY WITH BREAKFAST. Patient not taking: Reported on 02/16/2020 10/16/17 04/28/20  Hoyt Koch, MD      Allergies    Morphine and related    Review of Systems   Review of Systems  Constitutional:  Positive for fatigue. Negative for chills, diaphoresis and fever.  HENT:  Negative for congestion and rhinorrhea.   Eyes:  Negative for  visual disturbance.  Respiratory:  Negative for cough, chest tightness, shortness of breath and wheezing.   Cardiovascular:  Positive for chest pain. Negative for palpitations and leg swelling.  Gastrointestinal:  Positive for abdominal pain and nausea. Negative for abdominal distention, constipation, diarrhea and vomiting.  Genitourinary:  Positive for flank pain and frequency. Negative for decreased urine volume and dysuria.  Musculoskeletal:  Positive for back pain and myalgias. Negative for neck pain and neck stiffness.  Skin:  Negative for rash and wound.  Neurological:  Negative for dizziness, seizures, loss of consciousness, syncope, speech difficulty, weakness, light-headedness, numbness and headaches.  Psychiatric/Behavioral:  Negative for agitation.   All other systems reviewed and are negative.   Physical Exam Updated Vital Signs BP (!) 149/95 (BP Location: Left Arm)   Pulse 87   Temp 97.9 F (36.6 C)   Resp 18   Ht '6\' 1"'  (1.854 m)   Wt 96.1 kg   SpO2 99%   BMI 27.95 kg/m  Physical Exam Vitals and nursing note reviewed.  Constitutional:      General: He is not in acute distress.    Appearance: He is well-developed. He is not ill-appearing, toxic-appearing or diaphoretic.  HENT:     Head: Normocephalic and atraumatic.     Nose: No congestion or rhinorrhea.     Mouth/Throat:     Mouth: Mucous membranes are moist.     Pharynx: No oropharyngeal exudate or posterior oropharyngeal erythema.  Eyes:     Conjunctiva/sclera: Conjunctivae normal.     Pupils: Pupils are equal, round, and reactive to light.  Cardiovascular:     Rate and Rhythm: Normal rate and regular rhythm.     Heart sounds: No murmur heard. Pulmonary:     Effort: Pulmonary effort is normal. No respiratory distress.     Breath sounds: Normal breath sounds. No wheezing, rhonchi or rales.  Chest:     Chest wall: No tenderness.  Abdominal:     General: Abdomen is flat.     Palpations: Abdomen is soft.      Tenderness: There is no abdominal tenderness. There is right CVA tenderness. There is no left CVA tenderness, guarding or rebound.  Musculoskeletal:        General: Tenderness present. No swelling.     Cervical back: Neck supple. No tenderness.     Right lower leg: No edema.     Left lower leg: No edema.  Skin:    General: Skin is warm and dry.     Capillary Refill: Capillary refill takes less than 2 seconds.     Coloration: Skin is not pale.     Findings: No erythema or rash.  Neurological:     General: No focal deficit present.     Mental Status: He is alert.     Sensory: No sensory deficit.     Motor: No weakness.  Psychiatric:  Mood and Affect: Mood is anxious.        Speech: Speech is rapid and pressured.        Behavior: Behavior is not agitated or aggressive.        Thought Content: Thought content normal. Thought content does not include homicidal or suicidal ideation. Thought content does not include homicidal or suicidal plan.     ED Results / Procedures / Treatments   Labs (all labs ordered are listed, but only abnormal results are displayed) Labs Reviewed  URINE CULTURE  CBC WITH DIFFERENTIAL/PLATELET  COMPREHENSIVE METABOLIC PANEL  LIPASE, BLOOD  CK  URINALYSIS, ROUTINE W REFLEX MICROSCOPIC  TROPONIN I (HIGH SENSITIVITY)    EKG None  Radiology No results found.  Procedures Procedures    Medications Ordered in ED Medications - No data to display  ED Course/ Medical Decision Making/ A&P                           Medical Decision Making  Antonio Lawson is a 61 y.o. male with a past medical history significant for bipolar disorder reportedly off of all of his medications, diabetes, GERD, anxiety, previous appendectomy, previous left foot surgery with chronic pain, diverticulitis, and fatty liver who presents with a multitude of complaints including chest pain, abdominal pain, right flank pain, urinary frequency, diffuse pains in his  extremities, and fatigue.  Patient says that his primary complaint today is the pain in his right flank going towards his right side.  He reports his urine has been more frequent but denies any dysuria or hematuria.  He denies history of kidney stones.  He has had his appendix taken out.  There is no right lower quadrant abdominal pain reported but he does occasionally say that the pain goes to his abdomen.  He also then says he is having extremity pains then chest pain, then other pains all over.  He was focused on having a tick exposure last year but denies any recent tick exposures.  He is very tangential in his conversation and is very rapid and pressured speech although he vehemently denies any SI, HI, hallucinations, or any mental health troubles.  He tells me that he does not take any of his mental health medications because they do not make him feel good.  He tells me that his "thinking is fine" and he was encouraged to follow-up with his mental health provider as well.  He is denying any fevers, chills, cough, vomiting, constipation, or diarrhea for me.  On my exam, lungs are clear and chest is nontender.  Abdomen is nontender although he does have right flank tenderness and what appeared to be muscle spasms.  Right lower quadrant was nontender.  Extremities nontender.  No focal neurologic deficits.  Scars on his left foot.  Due to the patient's right flank pain and urinary symptoms and history of diverticulitis, I had a shared decision conversation initially and we agreed to get a CT stone study, urinalysis, labs, due to the chest pain we will get EKG and chest x-ray and with the diffuse myalgias get a CK.  Patient initially was willing to do this but then he changed his mind rapidly and said he wants to refuse all testing.  We informed him that we could not rule out significant or life-threatening problems if we do not do any testing on him today but he said he does not want it.  He told me that  he just wants some pain medicine and antibiotics but I informed him that without knowing what I would be treating it would not be prudent.  We encouraged him to follow-up with a primary care physician or call to get a new PCP.  Due to his diffuse inflammatory and joint type pains, we also discussed this could be some auto inflammatory or inflammatory condition such as arthritis but he needs to follow-up with a outpatient team to work this up.  Given his refusal for any other work-up and his otherwise well appearance and has reassuring vital signs overall, I do feel he is stable for leaving AMA.  He did not report any SI, HI, or hallucinations and I do not feel he needs IVC at this time although he did have somewhat pressured speech likely from his bipolar disorder.  Again I encouraged him to take his home medicines and follow-up with his mental health team as well.  Patient will leave [place].    Final Clinical Impression(s) / ED Diagnoses Final diagnoses:  Pain  Flank pain  Chest pain, unspecified type  Abdominal pain, unspecified abdominal location     Clinical Impression: 1. Pain   2. Flank pain   3. Chest pain, unspecified type   4. Abdominal pain, unspecified abdominal location     Disposition: AGAINST MEDICAL ADVICE  Condition: Stable appearing    Discharge Medication List as of 05/31/2022  3:18 PM      Follow Up: [place] [address]  [place] [address] [phone] Schedule an appointment as soon as possible for a visit    [facility] [place] [address] [phone]    Antony Blackbird, MD [address] [place] [address] [phone]        Tosh Glaze, Gwenyth Allegra, MD 05/31/22 [phone]

## 2022-07-03 ENCOUNTER — Other Ambulatory Visit: Payer: Self-pay

## 2022-07-03 ENCOUNTER — Emergency Department (HOSPITAL_BASED_OUTPATIENT_CLINIC_OR_DEPARTMENT_OTHER)
Admission: EM | Admit: 2022-07-03 | Discharge: 2022-07-03 | Disposition: A | Discharge status: Home / Self Care | Payer: Medicare Other | Attending: Emergency Medicine | Admitting: Emergency Medicine

## 2022-07-03 ENCOUNTER — Encounter (HOSPITAL_BASED_OUTPATIENT_CLINIC_OR_DEPARTMENT_OTHER): Payer: Self-pay | Admitting: Emergency Medicine

## 2022-07-03 ENCOUNTER — Other Ambulatory Visit (HOSPITAL_BASED_OUTPATIENT_CLINIC_OR_DEPARTMENT_OTHER): Payer: Self-pay

## 2022-07-03 ENCOUNTER — Emergency Department (HOSPITAL_BASED_OUTPATIENT_CLINIC_OR_DEPARTMENT_OTHER): Payer: Medicare Other | Admitting: Radiology

## 2022-07-03 DIAGNOSIS — M25571 Pain in right ankle and joints of right foot: Secondary | ICD-10-CM | POA: Insufficient documentation

## 2022-07-03 DIAGNOSIS — M25572 Pain in left ankle and joints of left foot: Secondary | ICD-10-CM | POA: Insufficient documentation

## 2022-07-03 DIAGNOSIS — M25521 Pain in right elbow: Secondary | ICD-10-CM | POA: Insufficient documentation

## 2022-07-03 DIAGNOSIS — M79652 Pain in left thigh: Secondary | ICD-10-CM | POA: Diagnosis not present

## 2022-07-03 DIAGNOSIS — M5136 Other intervertebral disc degeneration, lumbar region: Secondary | ICD-10-CM

## 2022-07-03 DIAGNOSIS — F17200 Nicotine dependence, unspecified, uncomplicated: Secondary | ICD-10-CM | POA: Insufficient documentation

## 2022-07-03 DIAGNOSIS — M25561 Pain in right knee: Secondary | ICD-10-CM | POA: Diagnosis not present

## 2022-07-03 DIAGNOSIS — M25562 Pain in left knee: Secondary | ICD-10-CM | POA: Diagnosis not present

## 2022-07-03 DIAGNOSIS — Z79899 Other long term (current) drug therapy: Secondary | ICD-10-CM | POA: Diagnosis not present

## 2022-07-03 DIAGNOSIS — M5134 Other intervertebral disc degeneration, thoracic region: Secondary | ICD-10-CM | POA: Diagnosis not present

## 2022-07-03 DIAGNOSIS — M25511 Pain in right shoulder: Secondary | ICD-10-CM | POA: Diagnosis not present

## 2022-07-03 DIAGNOSIS — R109 Unspecified abdominal pain: Secondary | ICD-10-CM | POA: Diagnosis not present

## 2022-07-03 DIAGNOSIS — M25531 Pain in right wrist: Secondary | ICD-10-CM | POA: Diagnosis not present

## 2022-07-03 DIAGNOSIS — Z7984 Long term (current) use of oral hypoglycemic drugs: Secondary | ICD-10-CM | POA: Insufficient documentation

## 2022-07-03 DIAGNOSIS — M2550 Pain in unspecified joint: Secondary | ICD-10-CM | POA: Diagnosis not present

## 2022-07-03 DIAGNOSIS — M79651 Pain in right thigh: Secondary | ICD-10-CM | POA: Diagnosis not present

## 2022-07-03 LAB — URINALYSIS, ROUTINE W REFLEX MICROSCOPIC
Bilirubin Urine: NEGATIVE
Glucose, UA: NEGATIVE mg/dL
Hgb urine dipstick: NEGATIVE
Ketones, ur: NEGATIVE mg/dL
Leukocytes,Ua: NEGATIVE
Nitrite: NEGATIVE
Protein, ur: NEGATIVE mg/dL
Specific Gravity, Urine: 1.018 (ref 1.005–1.030)
pH: 7 (ref 5.0–8.0)

## 2022-07-03 LAB — CBC WITH DIFFERENTIAL/PLATELET
Abs Immature Granulocytes: 0.04 10*3/uL (ref 0.00–0.07)
Basophils Absolute: 0.1 10*3/uL (ref 0.0–0.1)
Basophils Relative: 1 %
Eosinophils Absolute: 0.2 10*3/uL (ref 0.0–0.5)
Eosinophils Relative: 2 %
HCT: 43.9 % (ref 39.0–52.0)
Hemoglobin: 14.9 g/dL (ref 13.0–17.0)
Immature Granulocytes: 0 %
Lymphocytes Relative: 25 %
Lymphs Abs: 2.4 10*3/uL (ref 0.7–4.0)
MCH: 31.5 pg (ref 26.0–34.0)
MCHC: 33.9 g/dL (ref 30.0–36.0)
MCV: 92.8 fL (ref 80.0–100.0)
Monocytes Absolute: 0.6 10*3/uL (ref 0.1–1.0)
Monocytes Relative: 6 %
Neutro Abs: 6.2 10*3/uL (ref 1.7–7.7)
Neutrophils Relative %: 66 %
Platelets: 248 10*3/uL (ref 150–400)
RBC: 4.73 MIL/uL (ref 4.22–5.81)
RDW: 12.8 % (ref 11.5–15.5)
WBC: 9.4 10*3/uL (ref 4.0–10.5)
nRBC: 0 % (ref 0.0–0.2)

## 2022-07-03 LAB — COMPREHENSIVE METABOLIC PANEL
ALT: 23 U/L (ref 0–44)
AST: 15 U/L (ref 15–41)
Albumin: 4.6 g/dL (ref 3.5–5.0)
Alkaline Phosphatase: 45 U/L (ref 38–126)
Anion gap: 10 (ref 5–15)
BUN: 12 mg/dL (ref 6–20)
CO2: 22 mmol/L (ref 22–32)
Calcium: 9.9 mg/dL (ref 8.9–10.3)
Chloride: 107 mmol/L (ref 98–111)
Creatinine, Ser: 0.84 mg/dL (ref 0.61–1.24)
GFR, Estimated: >60 mL/min (ref >60)
Glucose, Bld: 131 mg/dL — ABNORMAL HIGH (ref 70–99)
Potassium: 3.9 mmol/L (ref 3.5–5.1)
Sodium: 139 mmol/L (ref 135–145)
Total Bilirubin: 1.1 mg/dL (ref 0.3–1.2)
Total Protein: 7.5 g/dL (ref 6.5–8.1)

## 2022-07-03 MED ORDER — CELECOXIB 100 MG PO CAPS
100.0000 mg | ORAL_CAPSULE | Freq: Two times a day (BID) | ORAL | 0 refills | Status: AC | PRN
  Filled 2022-07-03: qty 30, 15d supply, fill #0

## 2022-07-03 NOTE — ED Notes (Signed)
Patient verbalizes understanding of discharge instructions. Opportunity for questioning and answers were provided. Patient discharged from ED.  °

## 2022-07-03 NOTE — Discharge Instructions (Addendum)
Take Celebrex as prescribed.  Follow-up with your primary care provider, call today to schedule an appointment.

## 2022-07-03 NOTE — ED Notes (Signed)
Pt's complaint different than Triage, Pt only complains of back pain at this time

## 2022-07-03 NOTE — ED Triage Notes (Signed)
Right hip/ flank pain "its been like this" its gotten worse. Painful to take a deep breathe Also complains of wrist pain

## 2022-07-03 NOTE — ED Notes (Signed)
Patient transported to X-ray 

## 2022-07-03 NOTE — ED Provider Notes (Signed)
[place] EMERGENCY DEPT Provider Note   CSN: [id number] Arrival date & time: 07/03/22  0112     History  Chief Complaint  Patient presents with   Pain    Antonio Lawson is a 61 y.o. male.  61 year old male brought in by EMS with numerous complaints including bilateral flank pain, abdominal discomfort, pain in his right shoulder blade, right elbow, right wrist, bilateral knees, bilateral ankles, bilateral thighs.  Patient has been going to his primary care provider as well as an endocrinologist and several other specialists.  Patient has been checking his blood pressure and his blood sugar as he has been told to do so regularly.  Also states that he has been told he might have rheumatoid arthritis but is not taking any medication specific for this.  He was given an injection at some point in time, unsure what the injection was, did not provide any relief.  Denies any swelling or redness of his joints, no fevers.  Is concerned that he may have picked up something from a dog who is living with him and has been coughing recently also concerned that the person he is living with has been recently diagnosed with a spot on their lung. Past medical history of GERD, diverticulitis, bipolar disorder, fatty liver.  Prior abdominal surgeries include appendectomy and hernia repair.  Patient is a newly smoker, does not drink.       Home Medications Prior to Admission medications   Medication Sig Start Date End Date Taking? Authorizing Provider  amoxicillin-clavulanate (AUGMENTIN) 875-125 MG tablet Take 1 tablet by mouth every 12 (twelve) hours. 06/19/21   Tedd Sias, PA  atorvastatin (LIPITOR) 10 MG tablet Take 10 mg by mouth every other day. 04/28/22   [provider]  blood glucose meter kit and supplies Dispense based on patient and insurance preference. Use up to BID as directed. (FOR ICD-10 E10.9, E11.9). 09/07/17   Hoyt Koch, MD  celecoxib (CELEBREX)  100 MG capsule Take 1 capsule (100 mg total) by mouth 2 (two) times daily as needed. 07/03/22   Tacy Learn, PA-C  cyclobenzaprine (FLEXERIL) 5 MG tablet Take 1 tablet (5 mg total) by mouth 3 (three) times daily as needed for muscle spasms. 11/13/21   Davonna Belling, MD  diclofenac sodium (VOLTAREN) 1 % GEL Apply 2 g topically 4 (four) times daily. 09/06/17   Hoyt Koch, MD  gabapentin (NEURONTIN) 300 MG capsule Take by mouth. 05/09/22   [provider]  gabapentin (NEURONTIN) 600 MG tablet Take 600 mg by mouth at bedtime. 04/28/22   [provider]  hydrocortisone cream 1 % Apply to affected area 2 times daily 05/29/21   Valarie Merino, MD  JARDIANCE 25 MG TABS tablet Take 25 mg by mouth once. 04/12/22   [provider]  losartan (COZAAR) 25 MG tablet Take 12.5 mg by mouth daily. 05/25/22   [provider]  metFORMIN (GLUCOPHAGE) 1000 MG tablet Take 1 tablet (1,000 mg total) by mouth 2 (two) times daily with a meal. 09/06/17   Hoyt Koch, MD  metFORMIN (GLUCOPHAGE) 500 MG tablet Take 500 mg by mouth 2 (two) times daily. 03/25/20   [provider]  omeprazole (PRILOSEC) 20 MG capsule Take 20 mg by mouth daily. 05/18/22   [provider]  omeprazole (PRILOSEC) 40 MG capsule Take 40 mg by mouth daily. 03/04/20   [provider]  oxyCODONE-acetaminophen (PERCOCET/ROXICET) 5-325 MG tablet Take 1 tablet by mouth every  8 (eight) hours as needed for severe pain. 06/19/21   Fondaw, Wylder S, PA  OZEMPIC, 0.25 OR 0.5 MG/DOSE, 2 MG/3ML SOPN Inject into the skin. 04/27/22   [provider]  rosuvastatin (CRESTOR) 10 MG tablet Take 10 mg by mouth daily. 03/27/22   [provider]  tiZANidine (ZANAFLEX) 4 MG tablet Take 4 mg by mouth at bedtime as needed. 04/17/22   [provider]  TOUJEO MAX SOLOSTAR 300 UNIT/ML Solostar Pen 40 units sq qd 05/04/22   [provider]  zolpidem (AMBIEN) 10 MG tablet Take  10 mg by mouth at bedtime. 04/12/22   [provider]  glipiZIDE (GLUCOTROL XL) 10 MG 24 hr tablet TAKE 1 TABLET (10 MG TOTAL) BY MOUTH DAILY WITH BREAKFAST. Patient not taking: Reported on 02/16/2020 10/16/17 04/28/20  Hoyt Koch, MD      Allergies    Morphine and related    Review of Systems   Review of Systems Negative except as per HPI Physical Exam Updated Vital Signs BP (!) 138/102 (BP Location: Right Arm)   Pulse 75   Temp 98.1 F (36.7 C) (Oral)   Resp 18   Ht _0  (1.93 m)   Wt 104.3 kg   SpO2 100%   BMI 28.00 kg/m  Physical Exam Vitals and nursing note reviewed.  Constitutional:      General: He is not in acute distress.    Appearance: He is well-developed. He is not diaphoretic.  HENT:     Head: Normocephalic and atraumatic.     Mouth/Throat:     Mouth: Mucous membranes are moist.  Cardiovascular:     Rate and Rhythm: Normal rate and regular rhythm.     Heart sounds: Normal heart sounds.  Pulmonary:     Effort: Pulmonary effort is normal.     Breath sounds: Normal breath sounds.  Abdominal:     Palpations: Abdomen is soft.     Tenderness: There is no abdominal tenderness. There is no right CVA tenderness or left CVA tenderness.  Musculoskeletal:        General: No swelling or tenderness. Normal range of motion.     Cervical back: Neck supple. No tenderness or bony tenderness. No pain with movement. Normal range of motion.     Thoracic back: No tenderness or bony tenderness. Normal range of motion. Scoliosis present.     Lumbar back: No tenderness or bony tenderness. Normal range of motion.  Skin:    General: Skin is warm and dry.     Findings: No erythema or rash.  Neurological:     Mental Status: He is alert and oriented to person, place, and time.  Psychiatric:        Behavior: Behavior normal.     ED Results / Procedures / Treatments   Labs (all labs ordered are listed, but only abnormal results are displayed) Labs Reviewed   COMPREHENSIVE METABOLIC PANEL - Abnormal; Notable for the following components:      Result Value   Glucose, Bld 131 (*)    All other components within normal limits  URINALYSIS, ROUTINE W REFLEX MICROSCOPIC  CBC WITH DIFFERENTIAL/PLATELET    EKG None  Radiology DG Lumbar Spine Complete  Result Date: 07/03/2022 CLINICAL DATA:  Low back pain for 7 months. EXAM: LUMBAR SPINE - COMPLETE 4+ VIEW COMPARISON:  CT scan of the abdomen and pelvis June 19, 2021. FINDINGS: Anterior wedging of L1 is stable. No acute fracture or malalignment. Multilevel degenerative disc  disease most marked at T12-L1, L1-L2, and L2-L3. Lower lumbar facet degenerative changes are identified. Calcified atherosclerotic changes are identified in the distal abdominal aorta and iliac vessels. IMPRESSION: 1. Degenerative disc disease and lower lumbar facet degenerative changes as above. 2. Calcified atherosclerotic changes in the distal abdominal aorta and iliac vessels. 3. No other abnormalities. Electronically Signed   By: Dorise Bullion III M.D.   On: 07/03/2022 10:02   DG Thoracic Spine 2 View  Result Date: 07/03/2022 CLINICAL DATA:  Back pain for 7 months. EXAM: THORACIC SPINE 2 VIEWS COMPARISON:  None Available. FINDINGS: No fracture or traumatic malalignment. Mild multilevel degenerative disc disease with tiny anterior osteophytes. IMPRESSION: Mild multilevel degenerative disc disease with tiny anterior osteophytes. Electronically Signed   By: Dorise Bullion III M.D.   On: 07/03/2022 10:00    Procedures Procedures    Medications Ordered in ED Medications - No data to display  ED Course/ Medical Decision Making/ A&P                           Medical Decision Making Amount and/or Complexity of Data Reviewed Labs: ordered. Radiology: ordered.  Risk Prescription drug management.   61 year old male with past medical history as per HPI presents with complaint of polyarthralgia as well as back pain, flank  pain and several other complaints as per HPI.  On exam, he is alert, oriented, nontoxic and in no distress.  Abdomen is soft nontender, lungs clear to auscultation, heart regular rate and rhythm.  He does not have any joint swelling or redness.  He does have slight kyphosis versus scoliosis with forward bend, no reproducible back pain with palpation.  He mentions that he has been to his doctor who is mentioned he might have rheumatoid arthritis.  He was previously prescribed Celebrex however states he is never taken this medication.  His x-rays show degenerative changes through T-spine and L-spine.  His labs are reassuring including CBC, CMP and urinalysis which were all within normal limits.  Discussed results with patient, recommend following up with his primary care provider to discuss further evaluation and rheumatologic work-up if this has not already been done.  Prescribed Celebrex today for his degenerative disc disease and polyarthralgia.  Advised to take this medication and recheck with his doctor.        Final Clinical Impression(s) / ED Diagnoses Final diagnoses:  Polyarthralgia  Degenerative disc disease, thoracic  Degenerative disc disease, lumbar    Rx / DC Orders ED Discharge Orders          Ordered    celecoxib (CELEBREX) 100 MG capsule  2 times daily PRN        07/03/22 1116              Tacy Learn, PA-C 07/03/22 1119    Tegeler, Gwenyth Allegra, MD 07/03/22 1622

## 2022-07-03 NOTE — ED Notes (Signed)
EMS states that they brought him from home where he called for back pain but en route ems became aware of pt psychiatric history and he shared that he is not taking his medications and was anxious.  Pt has been seen for this previously and was told this back pain is related to arthritis but pt wanted another opinion.

## 2022-07-03 NOTE — ED Notes (Signed)
Provider at bedside

## 2022-10-27 ENCOUNTER — Ambulatory Visit
Admission: RE | Admit: 2022-10-27 | Discharge: 2022-10-27 | Disposition: A | Discharge status: Home / Self Care | Payer: 59 | Source: Ambulatory Visit | Attending: Family | Admitting: Family

## 2022-10-27 ENCOUNTER — Other Ambulatory Visit: Payer: Self-pay | Admitting: Family

## 2022-10-27 DIAGNOSIS — M545 Low back pain, unspecified: Secondary | ICD-10-CM

## 2022-11-15 ENCOUNTER — Ambulatory Visit
Admission: RE | Admit: 2022-11-15 | Discharge: 2022-11-15 | Disposition: A | Discharge status: Home / Self Care | Payer: 59 | Source: Ambulatory Visit | Attending: Family | Admitting: Family

## 2022-11-15 ENCOUNTER — Other Ambulatory Visit: Payer: Self-pay | Admitting: Family

## 2022-11-15 DIAGNOSIS — R0781 Pleurodynia: Secondary | ICD-10-CM

## 2022-11-15 DIAGNOSIS — M79642 Pain in left hand: Secondary | ICD-10-CM

## 2022-11-28 ENCOUNTER — Other Ambulatory Visit: Payer: Self-pay | Admitting: Internal Medicine

## 2022-11-28 DIAGNOSIS — J9811 Atelectasis: Secondary | ICD-10-CM

## 2022-11-29 ENCOUNTER — Ambulatory Visit
Admission: RE | Admit: 2022-11-29 | Discharge: 2022-11-29 | Disposition: A | Discharge status: Home / Self Care | Payer: 59 | Source: Ambulatory Visit | Attending: Internal Medicine | Admitting: Internal Medicine

## 2022-11-29 DIAGNOSIS — J9811 Atelectasis: Secondary | ICD-10-CM

## 2023-01-09 ENCOUNTER — Encounter (HOSPITAL_COMMUNITY): Payer: Self-pay

## 2023-01-09 ENCOUNTER — Other Ambulatory Visit: Payer: Self-pay

## 2023-01-09 ENCOUNTER — Emergency Department (HOSPITAL_COMMUNITY): Payer: 59

## 2023-01-09 ENCOUNTER — Emergency Department (HOSPITAL_COMMUNITY)
Admission: EM | Admit: 2023-01-09 | Discharge: 2023-01-09 | Disposition: A | Discharge status: Home / Self Care | Payer: 59 | Attending: Emergency Medicine | Admitting: Emergency Medicine

## 2023-01-09 DIAGNOSIS — M545 Low back pain, unspecified: Secondary | ICD-10-CM | POA: Diagnosis present

## 2023-01-09 DIAGNOSIS — R0781 Pleurodynia: Secondary | ICD-10-CM

## 2023-01-09 DIAGNOSIS — Z720 Tobacco use: Secondary | ICD-10-CM | POA: Insufficient documentation

## 2023-01-09 DIAGNOSIS — Z765 Malingerer [conscious simulation]: Secondary | ICD-10-CM

## 2023-01-09 DIAGNOSIS — R109 Unspecified abdominal pain: Secondary | ICD-10-CM

## 2023-01-09 DIAGNOSIS — E1165 Type 2 diabetes mellitus with hyperglycemia: Secondary | ICD-10-CM | POA: Insufficient documentation

## 2023-01-09 DIAGNOSIS — Z7289 Other problems related to lifestyle: Secondary | ICD-10-CM | POA: Diagnosis not present

## 2023-01-09 DIAGNOSIS — R1031 Right lower quadrant pain: Secondary | ICD-10-CM | POA: Insufficient documentation

## 2023-01-09 DIAGNOSIS — Z7984 Long term (current) use of oral hypoglycemic drugs: Secondary | ICD-10-CM | POA: Insufficient documentation

## 2023-01-09 DIAGNOSIS — R030 Elevated blood-pressure reading, without diagnosis of hypertension: Secondary | ICD-10-CM | POA: Insufficient documentation

## 2023-01-09 DIAGNOSIS — M25511 Pain in right shoulder: Secondary | ICD-10-CM | POA: Diagnosis not present

## 2023-01-09 LAB — BASIC METABOLIC PANEL
Anion gap: 12 (ref 5–15)
BUN: 11 mg/dL (ref 6–20)
CO2: 23 mmol/L (ref 22–32)
Calcium: 9.8 mg/dL (ref 8.9–10.3)
Chloride: 102 mmol/L (ref 98–111)
Creatinine, Ser: 1.05 mg/dL (ref 0.61–1.24)
GFR, Estimated: >60 mL/min (ref >60)
Glucose, Bld: 210 mg/dL — ABNORMAL HIGH (ref 70–99)
Potassium: 4.1 mmol/L (ref 3.5–5.1)
Sodium: 137 mmol/L (ref 135–145)

## 2023-01-09 LAB — URINALYSIS, ROUTINE W REFLEX MICROSCOPIC
Bilirubin Urine: NEGATIVE
Glucose, UA: NEGATIVE mg/dL
Hgb urine dipstick: NEGATIVE
Ketones, ur: NEGATIVE mg/dL
Leukocytes,Ua: NEGATIVE
Nitrite: NEGATIVE
Protein, ur: NEGATIVE mg/dL
Specific Gravity, Urine: 1.013 (ref 1.005–1.030)
pH: 5 (ref 5.0–8.0)

## 2023-01-09 LAB — CBC
HCT: 44.4 % (ref 39.0–52.0)
Hemoglobin: 15.6 g/dL (ref 13.0–17.0)
MCH: 31.6 pg (ref 26.0–34.0)
MCHC: 35.1 g/dL (ref 30.0–36.0)
MCV: 89.9 fL (ref 80.0–100.0)
Platelets: 297 10*3/uL (ref 150–400)
RBC: 4.94 MIL/uL (ref 4.22–5.81)
RDW: 12.4 % (ref 11.5–15.5)
WBC: 7.8 10*3/uL (ref 4.0–10.5)
nRBC: 0 % (ref 0.0–0.2)

## 2023-01-09 LAB — TROPONIN I (HIGH SENSITIVITY): Troponin I (High Sensitivity): <2 ng/L (ref <18)

## 2023-01-09 MED ORDER — OXYCODONE-ACETAMINOPHEN 5-325 MG PO TABS
1.0000 | ORAL_TABLET | Freq: Once | ORAL | Status: AC
  Administered 2023-01-09: 1 via ORAL
  Filled 2023-01-09: qty 1

## 2023-01-09 MED ORDER — LIDOCAINE VISCOUS HCL 2 % MT SOLN
15.0000 mL | Freq: Once | OROMUCOSAL | Status: AC
  Administered 2023-01-09: 15 mL via ORAL
  Filled 2023-01-09: qty 15

## 2023-01-09 MED ORDER — METHOCARBAMOL 500 MG PO TABS
500.0000 mg | ORAL_TABLET | Freq: Once | ORAL | Status: AC
  Administered 2023-01-09: 500 mg via ORAL
  Filled 2023-01-09: qty 1

## 2023-01-09 MED ORDER — ALUM & MAG HYDROXIDE-SIMETH 200-200-20 MG/5ML PO SUSP
30.0000 mL | Freq: Once | ORAL | Status: AC
  Administered 2023-01-09: 30 mL via ORAL
  Filled 2023-01-09: qty 30
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
## 2023-01-09 NOTE — Discharge Instructions (Signed)
Please use Tylenol or ibuprofen for pain.  You may use 600 mg ibuprofen every 6 hours or 1000 mg of Tylenol every 6 hours.  You may choose to alternate between the 2.  This would be most effective.  Not to exceed 4 g of Tylenol within 24 hours.  Not to exceed 3200 mg ibuprofen 24 hours.  

## 2023-01-31 ENCOUNTER — Encounter: Payer: Self-pay | Admitting: Orthopedic Surgery

## 2023-01-31 ENCOUNTER — Other Ambulatory Visit: Payer: Self-pay | Admitting: Orthopedic Surgery

## 2023-01-31 DIAGNOSIS — R109 Unspecified abdominal pain: Secondary | ICD-10-CM

## 2023-02-01 ENCOUNTER — Other Ambulatory Visit: Payer: 59

## 2023-02-02 ENCOUNTER — Ambulatory Visit
Admission: RE | Admit: 2023-02-02 | Discharge: 2023-02-02 | Disposition: A | Discharge status: Home / Self Care | Payer: 59 | Source: Ambulatory Visit | Attending: Orthopedic Surgery | Admitting: Orthopedic Surgery

## 2023-02-02 DIAGNOSIS — R109 Unspecified abdominal pain: Secondary | ICD-10-CM

## 2023-02-02 MED ORDER — IOPAMIDOL (ISOVUE-300) INJECTION 61%
100.0000 mL | Freq: Once | INTRAVENOUS | Status: AC | PRN
  Administered 2023-02-02: 100 mL via INTRAVENOUS

## 2023-03-15 ENCOUNTER — Other Ambulatory Visit: Payer: Self-pay | Admitting: Orthopedic Surgery

## 2023-03-15 DIAGNOSIS — M545 Low back pain, unspecified: Secondary | ICD-10-CM

## 2024-05-31 ENCOUNTER — Emergency Department (HOSPITAL_COMMUNITY)
Admission: EM | Admit: 2024-05-31 | Discharge: 2024-05-31 | Discharge status: Against Medical Advice | Attending: Emergency Medicine | Admitting: Emergency Medicine

## 2024-05-31 ENCOUNTER — Other Ambulatory Visit: Payer: Self-pay

## 2024-05-31 ENCOUNTER — Encounter (HOSPITAL_COMMUNITY): Payer: Self-pay | Admitting: Pharmacy Technician

## 2024-05-31 ENCOUNTER — Emergency Department (HOSPITAL_COMMUNITY)

## 2024-05-31 DIAGNOSIS — E1165 Type 2 diabetes mellitus with hyperglycemia: Secondary | ICD-10-CM | POA: Insufficient documentation

## 2024-05-31 DIAGNOSIS — Z5321 Procedure and treatment not carried out due to patient leaving prior to being seen by health care provider: Secondary | ICD-10-CM | POA: Insufficient documentation

## 2024-05-31 DIAGNOSIS — R0600 Dyspnea, unspecified: Secondary | ICD-10-CM | POA: Diagnosis not present

## 2024-05-31 DIAGNOSIS — R0789 Other chest pain: Secondary | ICD-10-CM | POA: Insufficient documentation

## 2024-05-31 LAB — BASIC METABOLIC PANEL WITH GFR
Anion gap: 12 (ref 5–15)
BUN: 15 mg/dL (ref 6–20)
CO2: 22 mmol/L (ref 22–32)
Calcium: 9.5 mg/dL (ref 8.9–10.3)
Chloride: 103 mmol/L (ref 98–111)
Creatinine, Ser: 1.36 mg/dL — ABNORMAL HIGH (ref 0.61–1.24)
GFR, Estimated: 60 mL/min — ABNORMAL LOW (ref >60)
Glucose, Bld: 203 mg/dL — ABNORMAL HIGH (ref 70–99)
Potassium: 4 mmol/L (ref 3.5–5.1)
Sodium: 137 mmol/L (ref 135–145)

## 2024-05-31 LAB — CBC
HCT: 41.5 % (ref 39.0–52.0)
Hemoglobin: 14.3 g/dL (ref 13.0–17.0)
MCH: 31.2 pg (ref 26.0–34.0)
MCHC: 34.5 g/dL (ref 30.0–36.0)
MCV: 90.6 fL (ref 80.0–100.0)
Platelets: 268 K/uL (ref 150–400)
RBC: 4.58 MIL/uL (ref 4.22–5.81)
RDW: 12.3 % (ref 11.5–15.5)
WBC: 9.4 K/uL (ref 4.0–10.5)
nRBC: 0 % (ref 0.0–0.2)

## 2024-05-31 LAB — TROPONIN I (HIGH SENSITIVITY)
Troponin I (High Sensitivity): <2 ng/L (ref <18)
Troponin I (High Sensitivity): 3 ng/L (ref <18)

## 2024-05-31 MED ORDER — ACETAMINOPHEN 500 MG PO TABS: 1000.0000 mg | ORAL_TABLET | Freq: Once | ORAL | Status: DC

## 2024-05-31 NOTE — ED Provider Triage Note (Signed)
 Emergency Medicine Provider Triage Evaluation Note  Antonio Lawson , a 63 y.o. male  was evaluated in triage.  Pt complains of generalized pain has been present for several months, states that he also had some chest tightness and dyspnea, known previous medical history of bipolar 1 disorder, anxiety.  Review of Systems  Positive: As above Negative:   Physical Exam  BP (!) 135/94 (BP Location: Left Arm)   Pulse 95   Temp 98.5 F (36.9 C)   Resp 18   SpO2 98%  Gen:   Awake, no distress   Resp:  Normal effort  MSK:   Moves extremities without difficulty  Other:    Medical Decision Making  Medically screening exam initiated at 11:31 AM.  Appropriate orders placed.  Antonio Lawson was informed that the remainder of the evaluation will be completed by another provider, this initial triage assessment does not replace that evaluation, and the importance of remaining in the ED until their evaluation is complete.  Workup largely completed prior to triage assessment, does show hyperglycemia and AKI, no leukocytosis nor is there an elevated troponin, chest x-ray is unremarkable.  EKG is unremarkable.  Will offer acetaminophen , encourage p.o. fluids, awaiting room placement.   Antonio Lawson, Antonio Lawson 05/31/24 1133

## 2024-05-31 NOTE — ED Notes (Signed)
 Pt assigned room, called 3x no response, moved OTF

## 2024-05-31 NOTE — ED Triage Notes (Addendum)
 [place] [facility] with generalized pain ongoing for 2 months. VSS with ems. Pt also states he has bugs on him, unable to visualize. Pt requests to speak to social work. Hx DM and insomnia. Pt now complaining of chest tightness for the last month or two.  CBG 238 97.78F

## 2024-06-14 ENCOUNTER — Other Ambulatory Visit: Payer: Self-pay

## 2024-06-14 ENCOUNTER — Emergency Department (HOSPITAL_COMMUNITY)

## 2024-06-14 ENCOUNTER — Telehealth: Payer: Self-pay

## 2024-06-14 ENCOUNTER — Encounter (HOSPITAL_COMMUNITY): Payer: Self-pay | Admitting: Emergency Medicine

## 2024-06-14 ENCOUNTER — Emergency Department (HOSPITAL_COMMUNITY): Admission: EM | Admit: 2024-06-14 | Discharge: 2024-06-14 | Disposition: A | Discharge status: Home / Self Care

## 2024-06-14 DIAGNOSIS — G8929 Other chronic pain: Secondary | ICD-10-CM | POA: Diagnosis not present

## 2024-06-14 DIAGNOSIS — M545 Low back pain, unspecified: Secondary | ICD-10-CM | POA: Insufficient documentation

## 2024-06-14 DIAGNOSIS — Z7984 Long term (current) use of oral hypoglycemic drugs: Secondary | ICD-10-CM | POA: Diagnosis not present

## 2024-06-14 DIAGNOSIS — M25561 Pain in right knee: Secondary | ICD-10-CM | POA: Insufficient documentation

## 2024-06-14 DIAGNOSIS — E119 Type 2 diabetes mellitus without complications: Secondary | ICD-10-CM | POA: Diagnosis not present

## 2024-06-14 DIAGNOSIS — M25531 Pain in right wrist: Secondary | ICD-10-CM | POA: Insufficient documentation

## 2024-06-14 DIAGNOSIS — M25562 Pain in left knee: Secondary | ICD-10-CM | POA: Insufficient documentation

## 2024-06-14 DIAGNOSIS — M25521 Pain in right elbow: Secondary | ICD-10-CM | POA: Insufficient documentation

## 2024-06-14 LAB — CBC
HCT: 44 % (ref 39.0–52.0)
Hemoglobin: 14.3 g/dL (ref 13.0–17.0)
MCH: 30.6 pg (ref 26.0–34.0)
MCHC: 32.5 g/dL (ref 30.0–36.0)
MCV: 94.2 fL (ref 80.0–100.0)
Platelets: 265 K/uL (ref 150–400)
RBC: 4.67 MIL/uL (ref 4.22–5.81)
RDW: 12.5 % (ref 11.5–15.5)
WBC: 7.7 K/uL (ref 4.0–10.5)
nRBC: 0 % (ref 0.0–0.2)

## 2024-06-14 LAB — CK: Total CK: 106 U/L (ref 49–397)

## 2024-06-14 LAB — BASIC METABOLIC PANEL WITH GFR
Anion gap: 9 (ref 5–15)
BUN: 10 mg/dL (ref 6–20)
CO2: 25 mmol/L (ref 22–32)
Calcium: 10 mg/dL (ref 8.9–10.3)
Chloride: 104 mmol/L (ref 98–111)
Creatinine, Ser: 0.89 mg/dL (ref 0.61–1.24)
GFR, Estimated: >60 mL/min (ref >60)
Glucose, Bld: 126 mg/dL — ABNORMAL HIGH (ref 70–99)
Potassium: 4 mmol/L (ref 3.5–5.1)
Sodium: 139 mmol/L (ref 135–145)

## 2024-06-14 MED ORDER — METHOCARBAMOL 500 MG PO TABS: 500.0000 mg | ORAL_TABLET | Freq: Two times a day (BID) | ORAL | 0 refills | Status: AC

## 2024-06-14 MED ORDER — LACTATED RINGERS IV BOLUS
1000.0000 mL | Freq: Once | INTRAVENOUS | Status: AC
  Administered 2024-06-14: 1000 mL via INTRAVENOUS

## 2024-06-14 MED ORDER — KETOROLAC TROMETHAMINE 15 MG/ML IJ SOLN
15.0000 mg | Freq: Once | INTRAMUSCULAR | Status: AC
  Administered 2024-06-14: 15 mg via INTRAVENOUS
  Filled 2024-06-14: qty 1

## 2024-06-14 MED ORDER — LIDOCAINE 5 % EX PTCH: 1.0000 | MEDICATED_PATCH | CUTANEOUS | 0 refills | Status: AC

## 2024-06-14 MED ORDER — METHOCARBAMOL 500 MG PO TABS: 500.0000 mg | ORAL_TABLET | Freq: Two times a day (BID) | ORAL | 0 refills | Status: DC

## 2024-06-14 MED ORDER — DICLOFENAC SODIUM 1 % EX GEL: 4.0000 g | Freq: Four times a day (QID) | CUTANEOUS | 0 refills | Status: DC

## 2024-06-14 MED ORDER — DICLOFENAC SODIUM 1 % EX GEL: 4.0000 g | Freq: Four times a day (QID) | CUTANEOUS | 0 refills | Status: AC

## 2024-06-14 MED ORDER — LIDOCAINE 5 % EX PTCH: 1.0000 | MEDICATED_PATCH | CUTANEOUS | 0 refills | Status: DC

## 2024-06-14 MED ORDER — OXYCODONE-ACETAMINOPHEN 5-325 MG PO TABS
1.0000 | ORAL_TABLET | Freq: Once | ORAL | Status: AC
  Administered 2024-06-14: 1 via ORAL
  Filled 2024-06-14: qty 1

## 2024-06-14 NOTE — Telephone Encounter (Signed)
 Patient called and said  the doctor said they would provide pain medication.   He was ordered muscle relaxants, and lidocaine  patches.  He verbalized that he did not get  discharge paperwork,  wants to complain- he has the ppatient advocate number. He is saying he is getting evicted, and has court, needs housing. Referred to housing authority

## 2024-06-14 NOTE — ED Notes (Signed)
 Pt refuses to change into purple scrubs because he's not suicidal like last time. Pt keeps mumbling that he needs an IV and Doctor.

## 2024-06-14 NOTE — ED Provider Notes (Signed)
 [place] EMERGENCY DEPARTMENT AT [facility] Provider Note   CSN: [id number] Arrival date & time: 06/14/24  9557     Patient presents with: Generalized Pain   Antonio Lawson is a 63 y.o. male.   63 year old male with past medical history of diabetes and hyperlipidemia presenting to the emergency department today with arthralgias.  The patient states that he has been having pain in his right wrist and bilateral knees now over the past few months.  States he is also having pain in his back and shoulders which is already been evaluated by orthopedics.  The patient was referred to rheumatology but is not followed up.  The patient states that he does a lot of heavy lifting at work and thinks that he may have injured his right wrist recently.  Also reports that he has not been having worsening pain of the bilateral knees.  He came to the emergency department due to this.  He states that he has also been having some myalgias and has not checked his blood sugars at home in quite some time.  He came to the emergency department today for all of the above.        Prior to Admission medications   Medication Sig Start Date End Date Taking? Authorizing Provider  diclofenac  Sodium (VOLTAREN ) 1 % GEL Apply 4 g topically 4 (four) times daily. 06/14/24  Yes Ula Prentice SAUNDERS, MD  lidocaine  (LIDODERM ) 5 % Place 1 patch onto the skin daily. Remove & Discard patch within 12 hours or as directed by MD 06/14/24  Yes Ula Prentice SAUNDERS, MD  methocarbamol  (ROBAXIN ) 500 MG tablet Take 1 tablet (500 mg total) by mouth 2 (two) times daily. 06/14/24  Yes Ula Prentice SAUNDERS, MD  amoxicillin -clavulanate (AUGMENTIN ) 875-125 MG tablet Take 1 tablet by mouth every 12 (twelve) hours. 06/19/21   Neldon Hamp RAMAN, PA  atorvastatin (LIPITOR) 10 MG tablet Take 10 mg by mouth every other day. 04/28/22   [provider]  blood glucose meter kit and supplies Dispense based on patient and insurance preference. Use up  to BID as directed. (FOR ICD-10 E10.9, E11.9). 09/07/17   Rollene Almarie LABOR, MD  celecoxib  (CELEBREX ) 100 MG capsule Take 1 capsule (100 mg total) by mouth 2 (two) times daily as needed. 07/03/22   Beverley Leita LABOR, PA-C  cyclobenzaprine  (FLEXERIL ) 5 MG tablet Take 1 tablet (5 mg total) by mouth 3 (three) times daily as needed for muscle spasms. 11/13/21   Patsey Lot, MD  diclofenac  sodium (VOLTAREN ) 1 % GEL Apply 2 g topically 4 (four) times daily. 09/06/17   Rollene Almarie LABOR, MD  gabapentin  (NEURONTIN ) 300 MG capsule Take by mouth. 05/09/22   [provider]  gabapentin  (NEURONTIN ) 600 MG tablet Take 600 mg by mouth at bedtime. 04/28/22   [provider]  hydrocortisone  cream 1 % Apply to affected area 2 times daily 05/29/21   Laurice Maude BROCKS, MD  JARDIANCE 25 MG TABS tablet Take 25 mg by mouth once. 04/12/22   [provider]  losartan (COZAAR) 25 MG tablet Take 12.5 mg by mouth daily. 05/25/22   [provider]  metFORMIN  (GLUCOPHAGE ) 1000 MG tablet Take 1 tablet (1,000 mg total) by mouth 2 (two) times daily with a meal. 09/06/17   Rollene Almarie LABOR, MD  metFORMIN  (GLUCOPHAGE ) 500 MG tablet Take 500 mg by mouth 2 (two) times daily. 03/25/20   [provider]  omeprazole (PRILOSEC) 20 MG capsule Take 20 mg  by mouth daily. 05/18/22   [provider]  omeprazole (PRILOSEC) 40 MG capsule Take 40 mg by mouth daily. 03/04/20   [provider]  oxyCODONE -acetaminophen  (PERCOCET /ROXICET) 5-325 MG tablet Take 1 tablet by mouth every 8 (eight) hours as needed for severe pain. 06/19/21   Fondaw, Wylder S, PA  OZEMPIC, 0.25 OR 0.5 MG/DOSE, 2 MG/3ML SOPN Inject into the skin. 04/27/22   [provider]  rosuvastatin (CRESTOR) 10 MG tablet Take 10 mg by mouth daily. 03/27/22   [provider]  tiZANidine (ZANAFLEX) 4 MG tablet Take 4 mg by mouth at bedtime as needed. 04/17/22   [provider]  TOUJEO  MAX SOLOSTAR 300  UNIT/ML Solostar Pen 40 units sq qd 05/04/22   [provider]  zolpidem (AMBIEN) 10 MG tablet Take 10 mg by mouth at bedtime. 04/12/22   [provider]  glipiZIDE  (GLUCOTROL  XL) 10 MG 24 hr tablet TAKE 1 TABLET (10 MG TOTAL) BY MOUTH DAILY WITH BREAKFAST. Patient not taking: Reported on 02/16/2020 10/16/17 04/28/20  Rollene Almarie LABOR, MD    Allergies: Morphine  and codeine    Review of Systems  Musculoskeletal:  Positive for arthralgias and myalgias.  All other systems reviewed and are negative.   Updated Vital Signs BP (!) 139/99 (BP Location: Right Arm)   Pulse 68   Temp 98.1 F (36.7 C) (Oral)   Resp 16   Ht 6' (1.829 m)   Wt 77.1 kg   SpO2 100%   BMI 23.06 kg/m   Physical Exam Vitals and nursing note reviewed.   Gen: NAD Eyes: PERRL, EOMI HEENT: no oropharyngeal swelling Neck: trachea midline Resp: clear to auscultation bilaterally Card: RRR, no murmurs, rubs, or gallops Abd: nontender, nondistended Extremities: no calf tenderness, no edema MSK: The patient is tender over the distal radius on the right wrist with no erythema or pain with passive range of motion, the patient is tender over the lateral joint lines bilaterally over the bilateral knees with no swelling or pain with passive range of motion noted, there is crepitus noted in both knees.  The remainder of the extremities are atraumatic Vascular: 2+ radial pulses bilaterally, 2+ DP pulses bilaterally Skin: no rashes Psyc: acting appropriately   (all labs ordered are listed, but only abnormal results are displayed) Labs Reviewed  BASIC METABOLIC PANEL WITH GFR - Abnormal; Notable for the following components:      Result Value   Glucose, Bld 126 (*)    All other components within normal limits  CBC  CK    EKG: None  Radiology: DG Knee Complete 4 Views Left Result Date: 06/14/2024 EXAM: 4 VIEW(S) XRAY OF THE LEFT KNEE 06/14/2024 08:28:00 AM COMPARISON: None available. CLINICAL  HISTORY: Pain evaluation. FINDINGS: BONES AND JOINTS: No acute fracture. No focal osseous lesion. No joint dislocation. No significant joint effusion. No significant degenerative changes. SOFT TISSUES: The soft tissues are unremarkable. IMPRESSION: 1. No acute abnormality detected. Electronically signed by: Waddell Calk MD 06/14/2024 08:41 AM EDT RP Workstation: [id number]   DG Knee Complete 4 Views Right Result Date: 06/14/2024 EXAM: 4+ VIEW(S) XRAY OF THE KNEE 06/14/2024 08:28:00 AM COMPARISON: None available. CLINICAL HISTORY: Pain. Pain evaluation. FINDINGS: BONES AND JOINTS: No acute fracture. No focal osseous lesion. No joint dislocation. No significant joint effusion. Mild tricompartmental degenerative arthritis. Chondrocalcinosis within medial and lateral compartments. SOFT TISSUES: The soft tissues are unremarkable. IMPRESSION: 1. No acute abnormality detected. 2. Mild degenerative change. Electronically signed by: Waddell Calk MD 06/14/2024  08:40 AM EDT RP Workstation: [id number]   DG Wrist Complete Right Result Date: 06/14/2024 EXAM: 3 or more VIEW(S) XRAY OF THE WRIST 06/14/2024 08:28:00 AM COMPARISON: None available. CLINICAL HISTORY: Pain. Pain evaluation. FINDINGS: BONES AND JOINTS: No acute fracture. Subchondral cysts within the lunate. Mild narrowing of radiocarpal joint space. No joint dislocation. SOFT TISSUES: The soft tissues are unremarkable. IMPRESSION: 1. No acute osseous abnormality. Electronically signed by: Waddell Calk MD 06/14/2024 08:38 AM EDT RP Workstation: [id number]     Procedures   Medications Ordered in the ED  oxyCODONE -acetaminophen  (PERCOCET /ROXICET) 5-325 MG per tablet 1 tablet (has no administration in time range)  lactated ringers  bolus 1,000 mL (1,000 mLs Intravenous New Bag/Given 06/14/24 0836)  ketorolac  (TORADOL ) 15 MG/ML injection 15 mg (15 mg Intravenous Given 06/14/24 0843)                                    Medical Decision  Making 63 year old male with past medical history of hyperlipidemia and diabetes presenting to the emergency department today with arthralgias and myalgias.  I will further evaluate the patient here with x-rays of his knees and wrist to evaluate for bony abnormalities given his age.  Will obtain basic labs here and give the patient IV fluids to check his blood sugar and to screen for anion gap or acidosis although he is well-appearing and suspicion for DKA is low at this time.  Will give the patient IV fluids.  Also obtain a CK here.  I will reevaluate for ultimate disposition.  Given the duration of symptoms I suspect this is likely due to benign etiology so suspect he will likely be discharged.  The patient's work appears reassuring.  X-ray shows some mild degenerative changes but no concerning findings.  The patient is discharged with return precautions.  He is referred to rheumatology.  Amount and/or Complexity of Data Reviewed Labs: ordered. Radiology: ordered.  Risk Prescription drug management.        Final diagnoses:  Other chronic pain    ED Discharge Orders          Ordered    lidocaine  (LIDODERM ) 5 %  Every 24 hours        06/14/24 0938    diclofenac  Sodium (VOLTAREN ) 1 % GEL  4 times daily        06/14/24 0938    methocarbamol  (ROBAXIN ) 500 MG tablet  2 times daily        06/14/24 9061               Ula Prentice SAUNDERS, MD 06/14/24 [phone]

## 2024-06-14 NOTE — Progress Notes (Signed)
 Orthopedic Tech Progress Note Patient Details:  Antonio Lawson 1961/07/25 [id number]  Patient ID: Antonio Lawson, male   DOB: 25-Nov-1960, 63 y.o.   MRN: [id number] Nurse applied wrist brace. Antonio Lawson 06/14/2024, 10:49 AM

## 2024-06-14 NOTE — ED Triage Notes (Signed)
 Patient BIB EMS from home c/o generalized pain x 3 months. Reports throbbing pain localized to the right wrist, right elbow, lower back, and both knees. 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

## 2024-06-14 NOTE — Discharge Instructions (Signed)
 Your workup today did not show any concerning findings.  Your x-rays did show some mild arthritis.  Please follow-up with the rheumatologist at the number provided.  You may try the lidocaine  patches and Voltaren  gel on your knees and wrist.  Try the muscle relaxer and follow-up.  Return to the ER for worsening symptoms.

## 2024-07-20 ENCOUNTER — Emergency Department (HOSPITAL_COMMUNITY)
Admission: EM | Admit: 2024-07-20 | Discharge: 2024-07-20 | Disposition: A | Discharge status: Home / Self Care | Attending: Emergency Medicine | Admitting: Emergency Medicine

## 2024-07-20 ENCOUNTER — Encounter (HOSPITAL_COMMUNITY): Payer: Self-pay | Admitting: Emergency Medicine

## 2024-07-20 ENCOUNTER — Other Ambulatory Visit: Payer: Self-pay

## 2024-07-20 DIAGNOSIS — M791 Myalgia, unspecified site: Secondary | ICD-10-CM | POA: Diagnosis present
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
## 2024-07-20 NOTE — ED Provider Notes (Signed)
 [place] EMERGENCY DEPARTMENT AT [facility] Provider Note   CSN: [id number] Arrival date & time: 07/20/24  9685     Patient presents with: Pain   Antonio Lawson is a 63 y.o. male with history of bipolar presents with complaints of generalized pain x 1 year.  Describes pain in his feet and ankles, knees, wrists, low back.  Does not report any new injury or trauma.  He has been seen multiple times for this including most recently on 10/11.  He has had extensive lab work and imaging as well without any acute findings.  Has been empirically treated for Lyme as well.  He has been treated with various anti-inflammatories and muscle relaxers.  States that not too long ago he was given a dose of oxycodone  and that provided relief.  He is coming in today specifically requesting Percocet .  Reports that he is under quite a bit of stress due to likely upcoming eviction.  Has an upcoming appointment with rheumatology.   HPI    Past Medical History:  Diagnosis Date   Bipolar 1 disorder (HCC)    Chicken pox    Diverticulitis    Diverticulosis    Fatty liver    GERD (gastroesophageal reflux disease)    Hallux rigidus of left foot    Hyperplastic colon polyp    Internal hemorrhoids    Past Surgical History:  Procedure Laterality Date   APPENDECTOMY     ARTHRODESIS METATARSALPHALANGEAL JOINT (MTPJ) Left 03/11/2020   Procedure: Left hallux metatarsophalangeal arthrodesis;  Surgeon: Kit Rush, MD;  Location: [facility];  Service: Orthopedics;  Laterality: Left;   HERNIA REPAIR       Prior to Admission medications   Medication Sig Start Date End Date Taking? Authorizing Provider  amoxicillin -clavulanate (AUGMENTIN ) 875-125 MG tablet Take 1 tablet by mouth every 12 (twelve) hours. 06/19/21   Neldon Hamp RAMAN, PA  atorvastatin (LIPITOR) 10 MG tablet Take 10 mg by mouth every other day. 04/28/22   [provider]  blood glucose meter kit and supplies  Dispense based on patient and insurance preference. Use up to BID as directed. (FOR ICD-10 E10.9, E11.9). 09/07/17   Rollene Almarie LABOR, MD  celecoxib  (CELEBREX ) 100 MG capsule Take 1 capsule (100 mg total) by mouth 2 (two) times daily as needed. 07/03/22   Beverley Leita LABOR, PA-C  cyclobenzaprine  (FLEXERIL ) 5 MG tablet Take 1 tablet (5 mg total) by mouth 3 (three) times daily as needed for muscle spasms. 11/13/21   Patsey Lot, MD  diclofenac  sodium (VOLTAREN ) 1 % GEL Apply 2 g topically 4 (four) times daily. 09/06/17   Rollene Almarie LABOR, MD  diclofenac  Sodium (VOLTAREN ) 1 % GEL Apply 4 g topically 4 (four) times daily. 06/14/24   Ula Prentice SAUNDERS, MD  gabapentin  (NEURONTIN ) 300 MG capsule Take by mouth. 05/09/22   [provider]  gabapentin  (NEURONTIN ) 600 MG tablet Take 600 mg by mouth at bedtime. 04/28/22   [provider]  hydrocortisone  cream 1 % Apply to affected area 2 times daily 05/29/21   Laurice Maude BROCKS, MD  JARDIANCE 25 MG TABS tablet Take 25 mg by mouth once. 04/12/22   [provider]  lidocaine  (LIDODERM ) 5 % Place 1 patch onto the skin daily. Remove & Discard patch within 12 hours or as directed by MD 06/14/24   Ula Prentice SAUNDERS, MD  losartan (COZAAR) 25 MG tablet Take 12.5 mg by mouth daily. 05/25/22   [provider]  metFORMIN  (GLUCOPHAGE ) 1000 MG tablet Take 1 tablet (1,000 mg total) by mouth 2 (two) times daily with a meal. 09/06/17   Rollene Almarie LABOR, MD  metFORMIN  (GLUCOPHAGE ) 500 MG tablet Take 500 mg by mouth 2 (two) times daily. 03/25/20   [provider]  methocarbamol  (ROBAXIN ) 500 MG tablet Take 1 tablet (500 mg total) by mouth 2 (two) times daily. 06/14/24   Ula Prentice SAUNDERS, MD  omeprazole (PRILOSEC) 20 MG capsule Take 20 mg by mouth daily. 05/18/22   [provider]  omeprazole (PRILOSEC) 40 MG capsule Take 40 mg by mouth daily. 03/04/20   [provider]  oxyCODONE -acetaminophen  (PERCOCET /ROXICET) 5-325 MG tablet  Take 1 tablet by mouth every 8 (eight) hours as needed for severe pain. 06/19/21   Fondaw, Wylder S, PA  OZEMPIC, 0.25 OR 0.5 MG/DOSE, 2 MG/3ML SOPN Inject into the skin. 04/27/22   [provider]  rosuvastatin (CRESTOR) 10 MG tablet Take 10 mg by mouth daily. 03/27/22   [provider]  tiZANidine (ZANAFLEX) 4 MG tablet Take 4 mg by mouth at bedtime as needed. 04/17/22   [provider]  TOUJEO  MAX SOLOSTAR 300 UNIT/ML Solostar Pen 40 units sq qd 05/04/22   [provider]  zolpidem (AMBIEN) 10 MG tablet Take 10 mg by mouth at bedtime. 04/12/22   [provider]  glipiZIDE  (GLUCOTROL  XL) 10 MG 24 hr tablet TAKE 1 TABLET (10 MG TOTAL) BY MOUTH DAILY WITH BREAKFAST. Patient not taking: Reported on 02/16/2020 10/16/17 04/28/20  Rollene Almarie LABOR, MD    Allergies: Morphine  and codeine    Review of Systems  Musculoskeletal:  Positive for arthralgias and myalgias.    Updated Vital Signs BP 116/88 (BP Location: Left Arm)   Pulse 71   Temp 97.7 F (36.5 C) (Oral)   Resp 18   Ht 6' (1.829 m)   Wt 77 kg   SpO2 98%   BMI 23.02 kg/m   Physical Exam Vitals and nursing note reviewed.  Constitutional:      General: He is not in acute distress.    Appearance: He is well-developed.  HENT:     Head: Normocephalic and atraumatic.  Eyes:     Conjunctiva/sclera: Conjunctivae normal.  Cardiovascular:     Rate and Rhythm: Normal rate and regular rhythm.     Heart sounds: No murmur heard. Pulmonary:     Effort: Pulmonary effort is normal. No respiratory distress.     Breath sounds: Normal breath sounds.  Abdominal:     Palpations: Abdomen is soft.     Tenderness: There is no abdominal tenderness.  Musculoskeletal:        General: No swelling.     Cervical back: Neck supple.     Comments: Tolerates full range of motion of all extremities, patient is ambulatory without discomfort  Skin:    General: Skin is warm and dry.     Capillary Refill:  Capillary refill takes less than 2 seconds.  Neurological:     Mental Status: He is alert.  Psychiatric:        Mood and Affect: Mood normal.     (all labs ordered are listed, but only abnormal results are displayed) Labs Reviewed - No data to display  EKG: None  Radiology: No results found.   Procedures   Medications Ordered in the ED - No data to display  Clinical Course as of 07/20/24 9287  [facility] Jul 20, 2024  0708 Patient evaluated for chronic generalized  atraumatic muscle and joint pain for over a year now.  He is hemodynamically stable.  His exam is entirely benign.  Has had extensive workup and multiple visits which have been reassuring to this point.  He is presenting today specifically requesting Percocet .  When I informed the patient that I will be unable to provide any narcotics here today in the ER for his chronic pain patient became very agitated and started throwing his medication everywhere.  Denied any further evaluation and was ultimately escorted out of the ER, cussing and screaming at staff.  [JT]    Clinical Course User Index [JT] Donnajean Lynwood DEL, PA-C                                 Medical Decision Making  This patient presents to the ED with chief complaint(s) of pain .  The complaint involves an extensive differential diagnosis and also carries with it a high risk of complications and morbidity.   Pertinent past medical history as listed in HPI  The differential diagnosis includes  Based off exam and history do not suspect septic joint, gout, fracture, dislocation, Guillain-Barr, myasthenia gravis  Additional history obtained: Records reviewed Care Everywhere/External Records  Disposition:   Patient eloped  Social Determinants of Health:   none  This note was dictated with voice recognition software.  Despite best efforts at proofreading, errors may have occurred which can change the documentation meaning.       Final diagnoses:  Myalgia     ED Discharge Orders     None          Donnajean Lynwood DEL DEVONNA 07/20/24 9268    Lorette Mayo, MD 07/20/24 [phone]

## 2024-07-20 NOTE — ED Triage Notes (Addendum)
 Patient BIB [facility] from home w/ complaint chronic aches and pains all over the body for a year now but increasing in intensity tonight and called EMS for evaluation at the hospital, looking for relief. Hx of DM.  BP: 150/90 HR: 93 98% SpO2 on RA  CBG 237

## 2024-09-24 ENCOUNTER — Emergency Department (HOSPITAL_COMMUNITY): Admission: EM | Admit: 2024-09-24 | Discharge: 2024-09-24 | Disposition: A | Discharge status: Home / Self Care

## 2024-09-24 DIAGNOSIS — I10 Essential (primary) hypertension: Secondary | ICD-10-CM | POA: Diagnosis not present

## 2024-09-24 DIAGNOSIS — M549 Dorsalgia, unspecified: Secondary | ICD-10-CM | POA: Diagnosis not present

## 2024-09-24 DIAGNOSIS — G8929 Other chronic pain: Secondary | ICD-10-CM | POA: Diagnosis present

## 2024-09-24 DIAGNOSIS — Z7984 Long term (current) use of oral hypoglycemic drugs: Secondary | ICD-10-CM | POA: Insufficient documentation

## 2024-09-24 DIAGNOSIS — Z79899 Other long term (current) drug therapy: Secondary | ICD-10-CM | POA: Insufficient documentation

## 2024-09-24 DIAGNOSIS — R10A1 Flank pain, right side: Secondary | ICD-10-CM | POA: Insufficient documentation

## 2024-09-24 DIAGNOSIS — Z794 Long term (current) use of insulin: Secondary | ICD-10-CM | POA: Insufficient documentation

## 2024-09-24 DIAGNOSIS — E119 Type 2 diabetes mellitus without complications: Secondary | ICD-10-CM | POA: Insufficient documentation

## 2024-09-24 LAB — COMPREHENSIVE METABOLIC PANEL WITH GFR
ALT: 32 U/L (ref 0–44)
AST: 23 U/L (ref 15–41)
Albumin: 4.6 g/dL (ref 3.5–5.0)
Alkaline Phosphatase: 50 U/L (ref 38–126)
Anion gap: 11 (ref 5–15)
BUN: 14 mg/dL (ref 8–23)
CO2: 24 mmol/L (ref 22–32)
Calcium: 9.6 mg/dL (ref 8.9–10.3)
Chloride: 106 mmol/L (ref 98–111)
Creatinine, Ser: 0.85 mg/dL (ref 0.61–1.24)
GFR, Estimated: >60 mL/min
Glucose, Bld: 102 mg/dL — ABNORMAL HIGH (ref 70–99)
Potassium: 3.7 mmol/L (ref 3.5–5.1)
Sodium: 140 mmol/L (ref 135–145)
Total Bilirubin: 0.3 mg/dL (ref 0.0–1.2)
Total Protein: 7.2 g/dL (ref 6.5–8.1)

## 2024-09-24 LAB — URINALYSIS, ROUTINE W REFLEX MICROSCOPIC
Bacteria, UA: NONE SEEN
Bilirubin Urine: NEGATIVE
Glucose, UA: NEGATIVE mg/dL
Ketones, ur: NEGATIVE mg/dL
Leukocytes,Ua: NEGATIVE
Nitrite: NEGATIVE
Protein, ur: NEGATIVE mg/dL
Specific Gravity, Urine: 1.008 (ref 1.005–1.030)
pH: 5 (ref 5.0–8.0)

## 2024-09-24 LAB — CBC WITH DIFFERENTIAL/PLATELET
Abs Immature Granulocytes: 0.03 K/uL (ref 0.00–0.07)
Basophils Absolute: 0.1 K/uL (ref 0.0–0.1)
Basophils Relative: 1 %
Eosinophils Absolute: 0.2 K/uL (ref 0.0–0.5)
Eosinophils Relative: 3 %
HCT: 41.1 % (ref 39.0–52.0)
Hemoglobin: 13.6 g/dL (ref 13.0–17.0)
Immature Granulocytes: 0 %
Lymphocytes Relative: 31 %
Lymphs Abs: 2.6 K/uL (ref 0.7–4.0)
MCH: 30.9 pg (ref 26.0–34.0)
MCHC: 33.1 g/dL (ref 30.0–36.0)
MCV: 93.4 fL (ref 80.0–100.0)
Monocytes Absolute: 0.5 K/uL (ref 0.1–1.0)
Monocytes Relative: 6 %
Neutro Abs: 4.9 K/uL (ref 1.7–7.7)
Neutrophils Relative %: 59 %
Platelets: 253 K/uL (ref 150–400)
RBC: 4.4 MIL/uL (ref 4.22–5.81)
RDW: 12.4 % (ref 11.5–15.5)
WBC: 8.3 K/uL (ref 4.0–10.5)
nRBC: 0 % (ref 0.0–0.2)

## 2024-09-24 LAB — LIPASE, BLOOD: Lipase: 48 U/L (ref 11–51)

## 2024-09-24 MED ORDER — OXYCODONE-ACETAMINOPHEN 5-325 MG PO TABS: 1.0000 | ORAL_TABLET | Freq: Four times a day (QID) | ORAL | 0 refills | Status: AC | PRN

## 2024-09-24 MED ORDER — OXYCODONE-ACETAMINOPHEN 5-325 MG PO TABS
1.0000 | ORAL_TABLET | Freq: Once | ORAL | Status: AC
  Administered 2024-09-24: 1 via ORAL
  Filled 2024-09-24: qty 1
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
## 2024-09-24 NOTE — ED Provider Notes (Signed)
 " [facility] AT [facility] Provider Note   CSN: [id number] Arrival date & time: 09/24/24  1415     Patient presents with: Flank Pain and Back Pain   Antonio Lawson is a 64 y.o. male.   Patient to ED with chronic complaints of right flank pain (>1 year), burning in the left lower leg (since 2017), urinary frequency (years). He denies hematuria, nausea, vomiting, fever. He reports he is taking medications for HTN, HLD, T2DM as prescribed. Per chart review, has been seen multiple times in the ED for similar complaints.   The history is provided by the patient. No language interpreter was used.  Flank Pain  Back Pain      Prior to Admission medications  Medication Sig Start Date End Date Taking? Authorizing Provider  oxyCODONE -acetaminophen  (PERCOCET /ROXICET) 5-325 MG tablet Take 1 tablet by mouth every 6 (six) hours as needed for severe pain (pain score 7-10). 09/24/24  Yes Arshia Spellman, Margit, PA-C  amoxicillin -clavulanate (AUGMENTIN ) 875-125 MG tablet Take 1 tablet by mouth every 12 (twelve) hours. 06/19/21   Neldon Hamp RAMAN, PA  atorvastatin (LIPITOR) 10 MG tablet Take 10 mg by mouth every other day. 04/28/22   [provider]  blood glucose meter kit and supplies Dispense based on patient and insurance preference. Use up to BID as directed. (FOR ICD-10 E10.9, E11.9). 09/07/17   Rollene Almarie LABOR, MD  celecoxib  (CELEBREX ) 100 MG capsule Take 1 capsule (100 mg total) by mouth 2 (two) times daily as needed. 07/03/22   Beverley Leita LABOR, PA-C  cyclobenzaprine  (FLEXERIL ) 5 MG tablet Take 1 tablet (5 mg total) by mouth 3 (three) times daily as needed for muscle spasms. 11/13/21   Patsey Lot, MD  diclofenac  sodium (VOLTAREN ) 1 % GEL Apply 2 g topically 4 (four) times daily. 09/06/17   Rollene Almarie LABOR, MD  diclofenac  Sodium (VOLTAREN ) 1 % GEL Apply 4 g topically 4 (four) times daily. 06/14/24   Ula Prentice SAUNDERS, MD  gabapentin  (NEURONTIN ) 300 MG  capsule Take by mouth. 05/09/22   [provider]  gabapentin  (NEURONTIN ) 600 MG tablet Take 600 mg by mouth at bedtime. 04/28/22   [provider]  hydrocortisone  cream 1 % Apply to affected area 2 times daily 05/29/21   Laurice Maude BROCKS, MD  JARDIANCE 25 MG TABS tablet Take 25 mg by mouth once. 04/12/22   [provider]  lidocaine  (LIDODERM ) 5 % Place 1 patch onto the skin daily. Remove & Discard patch within 12 hours or as directed by MD 06/14/24   Ula Prentice SAUNDERS, MD  losartan (COZAAR) 25 MG tablet Take 12.5 mg by mouth daily. 05/25/22   [provider]  metFORMIN  (GLUCOPHAGE ) 1000 MG tablet Take 1 tablet (1,000 mg total) by mouth 2 (two) times daily with a meal. 09/06/17   Rollene Almarie LABOR, MD  metFORMIN  (GLUCOPHAGE ) 500 MG tablet Take 500 mg by mouth 2 (two) times daily. 03/25/20   [provider]  methocarbamol  (ROBAXIN ) 500 MG tablet Take 1 tablet (500 mg total) by mouth 2 (two) times daily. 06/14/24   Ula Prentice SAUNDERS, MD  omeprazole (PRILOSEC) 20 MG capsule Take 20 mg by mouth daily. 05/18/22   [provider]  omeprazole (PRILOSEC) 40 MG capsule Take 40 mg by mouth daily. 03/04/20   [provider]  OZEMPIC, 0.25 OR 0.5 MG/DOSE, 2 MG/3ML SOPN Inject into the skin. 04/27/22   [provider]  rosuvastatin (CRESTOR) 10 MG tablet Take 10  mg by mouth daily. 03/27/22   [provider]  tiZANidine (ZANAFLEX) 4 MG tablet Take 4 mg by mouth at bedtime as needed. 04/17/22   [provider]  TOUJEO  MAX SOLOSTAR 300 UNIT/ML Solostar Pen 40 units sq qd 05/04/22   [provider]  zolpidem (AMBIEN) 10 MG tablet Take 10 mg by mouth at bedtime. 04/12/22   [provider]  glipiZIDE  (GLUCOTROL  XL) 10 MG 24 hr tablet TAKE 1 TABLET (10 MG TOTAL) BY MOUTH DAILY WITH BREAKFAST. Patient not taking: Reported on 02/16/2020 10/16/17 04/28/20  Rollene Almarie LABOR, MD    Allergies: Morphine  and codeine    Review of  Systems  Genitourinary:  Positive for flank pain.  Musculoskeletal:  Positive for back pain.    Updated Vital Signs BP 134/88 (BP Location: Right Arm)   Pulse (!) 53   Temp 97.8 F (36.6 C) (Oral)   Resp 18   SpO2 98%   Physical Exam Constitutional:      Appearance: He is well-developed.  HENT:     Head: Normocephalic.  Cardiovascular:     Rate and Rhythm: Normal rate and regular rhythm.     Heart sounds: No murmur heard. Pulmonary:     Effort: Pulmonary effort is normal.     Breath sounds: No wheezing, rhonchi or rales.  Abdominal:     General: Bowel sounds are normal.     Palpations: Abdomen is soft.     Tenderness: There is no abdominal tenderness. There is no right CVA tenderness (No reproducible tenderness about the right flank (area of complaint)), left CVA tenderness, guarding or rebound.  Musculoskeletal:        General: Normal range of motion.     Cervical back: Normal range of motion and neck supple.  Skin:    General: Skin is warm and dry.  Neurological:     General: No focal deficit present.     Mental Status: He is alert and oriented to person, place, and time.     (all labs ordered are listed, but only abnormal results are displayed) Labs Reviewed  COMPREHENSIVE METABOLIC PANEL WITH GFR - Abnormal; Notable for the following components:      Result Value   Glucose, Bld 102 (*)    All other components within normal limits  URINALYSIS, ROUTINE W REFLEX MICROSCOPIC - Abnormal; Notable for the following components:   Color, Urine STRAW (*)    Hgb urine dipstick SMALL (*)    All other components within normal limits  LIPASE, BLOOD  CBC WITH DIFFERENTIAL/PLATELET    EKG: None  Radiology: No results found.   Procedures   Medications Ordered in the ED  oxyCODONE -acetaminophen  (PERCOCET /ROXICET) 5-325 MG per tablet 1 tablet (1 tablet Oral Given 09/24/24 1924)    Clinical Course as of 09/24/24 2036  Wed Sep 24, 2024  1753 Patient here with chronic  complaints and no new symptoms, including no fever, vomiting, injury. VSS. He reports difficulty getting up from the bed, but was found standing on my entry into the room and was able to move about, get back to bed and lie back without limitation. Labs pending. Pain addressed.  [SU]  2031 Labs reassuring. No acute findings. VSS. He is requesting pain medications. PDMD reviewed. Will provide #5 oxycodone . Discussed chronic pain is not managed through the emergency department.  [SU]    Clinical Course User Index [SU] Odell Balls, PA-C  Medical Decision Making Amount and/or Complexity of Data Reviewed Labs: ordered.  Risk Prescription drug management.        Final diagnoses:  Other chronic pain    ED Discharge Orders          Ordered    oxyCODONE -acetaminophen  (PERCOCET /ROXICET) 5-325 MG tablet  Every 6 hours PRN        09/24/24 2036               Odell Balls, PA-C 09/24/24 2037    Simon Lavonia SAILOR, MD 09/24/24 2149  "

## 2024-09-24 NOTE — Discharge Instructions (Signed)
 Follow up with a primary cre provider of  your choice for further management.
